# Patient Record
Sex: Female | Born: 1980 | Race: Black or African American | Hispanic: No | Marital: Single | Smoking: Current every day smoker
Health system: Southern US, Community
[De-identification: ages and names within clinical notes are randomized; demographics above are authoritative.]

## PROBLEM LIST (undated history)

## (undated) DIAGNOSIS — S0500XA Injury of conjunctiva and corneal abrasion without foreign body, unspecified eye, initial encounter: Secondary | ICD-10-CM

## (undated) DIAGNOSIS — G43909 Migraine, unspecified, not intractable, without status migrainosus: Secondary | ICD-10-CM

## (undated) DIAGNOSIS — F32A Depression, unspecified: Secondary | ICD-10-CM

## (undated) DIAGNOSIS — K219 Gastro-esophageal reflux disease without esophagitis: Secondary | ICD-10-CM

## (undated) DIAGNOSIS — M1990 Unspecified osteoarthritis, unspecified site: Secondary | ICD-10-CM

## (undated) DIAGNOSIS — F419 Anxiety disorder, unspecified: Secondary | ICD-10-CM

## (undated) DIAGNOSIS — T7840XA Allergy, unspecified, initial encounter: Secondary | ICD-10-CM

## (undated) DIAGNOSIS — J45909 Unspecified asthma, uncomplicated: Secondary | ICD-10-CM

## (undated) HISTORY — PX: CERVICAL POLYPECTOMY: SHX88

## (undated) HISTORY — PX: DILATION AND CURETTAGE OF UTERUS: SHX78

## (undated) HISTORY — DX: Anxiety disorder, unspecified: F41.9

## (undated) HISTORY — DX: Allergy, unspecified, initial encounter: T78.40XA

## (undated) HISTORY — DX: Unspecified osteoarthritis, unspecified site: M19.90

## (undated) HISTORY — DX: Gastro-esophageal reflux disease without esophagitis: K21.9

## (undated) HISTORY — DX: Depression, unspecified: F32.A

## (undated) HISTORY — DX: Migraine, unspecified, not intractable, without status migrainosus: G43.909

## (undated) HISTORY — DX: Injury of conjunctiva and corneal abrasion without foreign body, unspecified eye, initial encounter: S05.00XA

---

## 2012-01-09 ENCOUNTER — Emergency Department (INDEPENDENT_AMBULATORY_CARE_PROVIDER_SITE_OTHER)
Admission: EM | Admit: 2012-01-09 | Discharge: 2012-01-09 | Disposition: A | Discharge status: Home / Self Care | Payer: Self-pay | Source: Home / Self Care | Attending: Family Medicine | Admitting: Family Medicine

## 2012-01-09 ENCOUNTER — Encounter (HOSPITAL_COMMUNITY): Payer: Self-pay | Admitting: Emergency Medicine

## 2012-01-09 DIAGNOSIS — B86 Scabies: Secondary | ICD-10-CM

## 2012-01-09 DIAGNOSIS — L301 Dyshidrosis [pompholyx]: Secondary | ICD-10-CM

## 2012-01-09 HISTORY — DX: Unspecified asthma, uncomplicated: J45.909

## 2012-01-09 MED ORDER — HYDROXYZINE HCL 25 MG PO TABS: 25.0000 mg | ORAL_TABLET | Freq: Four times a day (QID) | ORAL | Status: AC

## 2012-01-09 MED ORDER — PERMETHRIN 5 % EX CREA: TOPICAL_CREAM | CUTANEOUS | Status: AC

## 2012-01-09 MED ORDER — TRIAMCINOLONE ACETONIDE 0.5 % EX OINT: TOPICAL_OINTMENT | CUTANEOUS | Status: DC

## 2012-01-09 NOTE — ED Provider Notes (Addendum)
History     CSN: [id number]  Arrival date & time 01/09/12  1546   First MD Initiated Contact with Patient 01/09/12 1600      Chief Complaint  Patient presents with  . Rash  . Allergies    (Consider location/radiation/quality/duration/timing/severity/associated sxs/prior treatment) HPI Comments: 31 year old morbidly obese female with history of glucose intolerance and asthma. Here complaining of 3 days with an itchy rash in her hands that she describes as itchy blisters. She denies having this rash in the past. She's also complaining of itchiness around her armpits  her low abdomen. 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 Her symptoms are worse at nighttime. She is not using any medications for her symptoms currently.   Past Medical History  Diagnosis Date  . Diabetes mellitus   . Asthma     History reviewed. No pertinent past surgical history.  No family history on file.  History  Substance Use Topics  . Smoking status: Current Everyday Smoker  . Smokeless tobacco: Not on file  . Alcohol Use: No    OB History    Grav Para Term Preterm Abortions TAB SAB Ect Mult Living                  Review of Systems  Constitutional: Negative for fever and chills.  HENT: Negative for sore throat and trouble swallowing.   Eyes: Negative for redness and itching.  Gastrointestinal: Negative for nausea, vomiting and abdominal pain.  Musculoskeletal: Negative for myalgias and arthralgias.  Skin: Positive for rash.  Neurological: Negative for headaches.    Allergies  Sulfa antibiotics  Home Medications   Current Outpatient Rx  Name Route Sig Dispense Refill  . HYDROXYZINE HCL 25 MG PO TABS Oral Take 1 tablet (25 mg total) by mouth every 6 (six) hours. 20 tablet 0  . PERMETHRIN 5 % EX CREA  Apply to affected area from neck to toe once as instructed. Can repeat in 14 days. 60 g 1  . TRIAMCINOLONE ACETONIDE 0.5 % EX OINT   Apply in affected skin twice a day 30 g 0    BP 117/81  Pulse 77  Temp 97.8 F (36.6 C) (Oral)  Resp 18  SpO2 99%  LMP 12/31/2011  Physical Exam  Nursing note and vitals reviewed. Constitutional: She is oriented to person, place, and time. She appears well-developed and well-nourished. No distress.       Morbidly obese  HENT:  Head: Normocephalic and atraumatic.  Mouth/Throat: Oropharynx is clear and moist. No oropharyngeal exudate.  Eyes: Conjunctivae are normal. Pupils are equal, round, and reactive to light. Right eye exhibits no discharge. Left eye exhibits no discharge. No scleral icterus.  Neck: Neck supple.  Cardiovascular: Normal heart sounds.   Pulmonary/Chest: Breath sounds normal.  Lymphadenopathy:    She has no cervical adenopathy.  Neurological: She is alert and oriented to person, place, and time.  Skin:       Hands: papular erythema scattered in dorsum of hands. There is also deep vesicular erythema of the palms. Rash is pruriginous. There are some excoriated papules in inner wrists.  There are also excoriated papular lesions in forearms and more confluent close to arm pits bilaterally and on low abdomen. No vesicles or pustules. No skin swelling or erythema.     ED Course  Procedures (including critical care time)  Labs Reviewed - No data to display No results found.  1. Dyshidrotic hand dermatitis   2. 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 Rash in her torso and proximal arms resembles scabies. Treated with triamcinolone ointment, hydroxyzine and permethrin. Asked to return or followup with her PCP or dermatology as needed.   Sharin Grave, MD 01/10/12 1610  Sharin Grave, MD 01/10/12 [id number]

## 2012-01-09 NOTE — ED Notes (Signed)
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

## 2012-07-24 ENCOUNTER — Emergency Department (HOSPITAL_COMMUNITY)
Admission: EM | Admit: 2012-07-24 | Discharge: 2012-07-24 | Disposition: A | Discharge status: Home / Self Care | Payer: PRIVATE HEALTH INSURANCE | Attending: Emergency Medicine | Admitting: Emergency Medicine

## 2012-07-24 ENCOUNTER — Encounter (HOSPITAL_COMMUNITY): Payer: Self-pay | Admitting: Emergency Medicine

## 2012-07-24 DIAGNOSIS — A088 Other specified intestinal infections: Secondary | ICD-10-CM | POA: Insufficient documentation

## 2012-07-24 DIAGNOSIS — Z3202 Encounter for pregnancy test, result negative: Secondary | ICD-10-CM | POA: Insufficient documentation

## 2012-07-24 DIAGNOSIS — F17200 Nicotine dependence, unspecified, uncomplicated: Secondary | ICD-10-CM | POA: Insufficient documentation

## 2012-07-24 DIAGNOSIS — R1013 Epigastric pain: Secondary | ICD-10-CM | POA: Insufficient documentation

## 2012-07-24 DIAGNOSIS — A599 Trichomoniasis, unspecified: Secondary | ICD-10-CM | POA: Insufficient documentation

## 2012-07-24 DIAGNOSIS — E119 Type 2 diabetes mellitus without complications: Secondary | ICD-10-CM | POA: Insufficient documentation

## 2012-07-24 DIAGNOSIS — A084 Viral intestinal infection, unspecified: Secondary | ICD-10-CM

## 2012-07-24 DIAGNOSIS — J45909 Unspecified asthma, uncomplicated: Secondary | ICD-10-CM | POA: Insufficient documentation

## 2012-07-24 DIAGNOSIS — Z79899 Other long term (current) drug therapy: Secondary | ICD-10-CM | POA: Insufficient documentation

## 2012-07-24 LAB — CBC WITH DIFFERENTIAL/PLATELET
Basophils Absolute: 0 10*3/uL (ref 0.0–0.1)
HCT: 37.1 % (ref 36.0–46.0)
Lymphocytes Relative: 15 % (ref 12–46)
Monocytes Absolute: 0.4 10*3/uL (ref 0.1–1.0)
Neutro Abs: 6.5 10*3/uL (ref 1.7–7.7)
Platelets: 249 10*3/uL (ref 150–400)
RBC: 4.3 MIL/uL (ref 3.87–5.11)
RDW: 14.5 % (ref 11.5–15.5)
WBC: 8.3 10*3/uL (ref 4.0–10.5)

## 2012-07-24 LAB — URINE MICROSCOPIC-ADD ON

## 2012-07-24 LAB — POCT PREGNANCY, URINE: Preg Test, Ur: NEGATIVE

## 2012-07-24 LAB — URINALYSIS, ROUTINE W REFLEX MICROSCOPIC
Glucose, UA: NEGATIVE mg/dL
Protein, ur: NEGATIVE mg/dL
Specific Gravity, Urine: 1.023 (ref 1.005–1.030)

## 2012-07-24 LAB — BASIC METABOLIC PANEL
CO2: 23 mEq/L (ref 19–32)
Chloride: 103 mEq/L (ref 96–112)
Sodium: 136 mEq/L (ref 135–145)

## 2012-07-24 LAB — LIPASE, BLOOD: Lipase: 25 U/L (ref 11–59)

## 2012-07-24 MED ORDER — ONDANSETRON HCL 4 MG/2ML IJ SOLN
4.0000 mg | Freq: Once | INTRAMUSCULAR | Status: AC
  Administered 2012-07-24: 4 mg via INTRAVENOUS

## 2012-07-24 MED ORDER — ONDANSETRON HCL 4 MG PO TABS: 4.0000 mg | ORAL_TABLET | Freq: Three times a day (TID) | ORAL | Status: DC | PRN

## 2012-07-24 MED ORDER — ONDANSETRON HCL 4 MG/2ML IJ SOLN
4.0000 mg | Freq: Once | INTRAMUSCULAR | Status: AC
  Administered 2012-07-24: 4 mg via INTRAVENOUS
  Filled 2012-07-24: qty 2

## 2012-07-24 MED ORDER — SODIUM CHLORIDE 0.9 % IV BOLUS (SEPSIS)
1000.0000 mL | Freq: Once | INTRAVENOUS | Status: AC
  Administered 2012-07-24: 1000 mL via INTRAVENOUS

## 2012-07-24 MED ORDER — HYDROCODONE-ACETAMINOPHEN 5-325 MG PO TABS: 2.0000 | ORAL_TABLET | ORAL | Status: DC | PRN

## 2012-07-24 MED ORDER — METRONIDAZOLE 500 MG PO TABS: 500.0000 mg | ORAL_TABLET | Freq: Two times a day (BID) | ORAL | Status: DC

## 2012-07-24 MED ORDER — MORPHINE SULFATE 4 MG/ML IJ SOLN
4.0000 mg | Freq: Once | INTRAMUSCULAR | Status: AC
  Administered 2012-07-24: 4 mg via INTRAVENOUS
  Filled 2012-07-24: qty 1

## 2012-07-24 MED ORDER — ONDANSETRON HCL 4 MG/2ML IJ SOLN
INTRAMUSCULAR | Status: AC
  Administered 2012-07-24: 4 mg via INTRAVENOUS
  Filled 2012-07-24: qty 2

## 2012-07-24 NOTE — ED Provider Notes (Signed)
This is a 32 year old female, who presents emergency department with chief complaint of nausea and vomiting. Patient was initially seen by [facility], PA-C, who tells me that the plan will be to discharge the patient after routine labs have been obtained.  Results for orders placed during the hospital encounter of 07/24/12  URINALYSIS, ROUTINE W REFLEX MICROSCOPIC      Component Value Range   Color, Urine YELLOW  YELLOW   APPearance HAZY (*) CLEAR   Specific Gravity, Urine 1.023  1.005 - 1.030   pH 7.0  5.0 - 8.0   Glucose, UA NEGATIVE  NEGATIVE mg/dL   Hgb urine dipstick LARGE (*) NEGATIVE   Bilirubin Urine NEGATIVE  NEGATIVE   Ketones, ur 40 (*) NEGATIVE mg/dL   Protein, ur NEGATIVE  NEGATIVE mg/dL   Urobilinogen, UA 1.0  0.0 - 1.0 mg/dL   Nitrite NEGATIVE  NEGATIVE   Leukocytes, UA SMALL (*) NEGATIVE  CBC WITH DIFFERENTIAL      Component Value Range   WBC 8.3  4.0 - 10.5 K/uL   RBC 4.30  3.87 - 5.11 MIL/uL   Hemoglobin 12.3  12.0 - 15.0 g/dL   HCT 57.8  46.9 - 62.9 %   MCV 86.3  78.0 - 100.0 fL   MCH 28.6  26.0 - 34.0 pg   MCHC 33.2  30.0 - 36.0 g/dL   RDW 52.8  41.3 - 24.4 %   Platelets 249  150 - 400 K/uL   Neutrophils Relative 79 (*) 43 - 77 %   Neutro Abs 6.5  1.7 - 7.7 K/uL   Lymphocytes Relative 15  12 - 46 %   Lymphs Abs 1.3  0.7 - 4.0 K/uL   Monocytes Relative 5  3 - 12 %   Monocytes Absolute 0.4  0.1 - 1.0 K/uL   Eosinophils Relative 1  0 - 5 %   Eosinophils Absolute 0.1  0.0 - 0.7 K/uL   Basophils Relative 0  0 - 1 %   Basophils Absolute 0.0  0.0 - 0.1 K/uL  BASIC METABOLIC PANEL      Component Value Range   Sodium 136  135 - 145 mEq/L   Potassium 4.1  3.5 - 5.1 mEq/L   Chloride 103  96 - 112 mEq/L   CO2 23  19 - 32 mEq/L   Glucose, Bld 98  70 - 99 mg/dL   BUN 8  6 - 23 mg/dL   Creatinine, Ser 0.10 (*) 0.50 - 1.10 mg/dL   Calcium 8.4  8.4 - 27.2 mg/dL   GFR calc non Af Amer >90  >90 mL/min   GFR calc Af Amer >90  >90 mL/min  LIPASE, BLOOD      Component  Value Range   Lipase 25  11 - 59 U/L  POCT PREGNANCY, URINE      Component Value Range   Preg Test, Ur NEGATIVE  NEGATIVE  URINE MICROSCOPIC-ADD ON      Component Value Range   Squamous Epithelial / LPF MANY (*) RARE   WBC, UA 3-6  <3 WBC/hpf   RBC / HPF 0-2  <3 RBC/hpf   Bacteria, UA FEW (*) RARE   Urine-Other TRICHOMONAS PRESENT     Patient feeling better after receiving Zofran and pain medicine. I'm going to discharge the patient to home with a few pain pills, Zofran, and metronidazole to treat Trichomonas in her urine. Encouraged the patient to notify her sexual partners, so they can also be treated if  necessary. Patient understands and agrees with the plan. She is stable and a for discharge. Patient is tolerating by mouth fluids.    Roxy Horseman, PA-C 07/24/12 [phone]

## 2012-07-24 NOTE — ED Notes (Signed)
Pt tolerated fluids well, NAD. Pt requesting sandwich.

## 2012-07-24 NOTE — ED Provider Notes (Signed)
Please see the initial physician note.  I was available for consultation the completion of the patient's care.  Gerhard Munch, MD 07/24/12 2358

## 2012-07-24 NOTE — ED Notes (Signed)
Vomiting since 4 am feels weak

## 2012-07-24 NOTE — ED Notes (Signed)
Pt states she feels better. Provider informed

## 2012-07-24 NOTE — ED Provider Notes (Signed)
History     CSN: [id number]  Arrival date & time 07/24/12  1353   First MD Initiated Contact with Patient 07/24/12 1416      Chief Complaint  Patient presents with  . Emesis    (Consider location/radiation/quality/duration/timing/severity/associated sxs/prior treatment) HPI Comments: Patient is a 32 year old female with a past medical history of diabetes who presents with epigastric abdominal pain that started this morning. The pain is located in her epigastrium and does not radiate. The pain is described as cramping and severe. The pain started gradually and progressively worsened since the onset. No alleviating/aggravating factors. The patient has tried nothing for symptoms without relief. Associated symptoms include nausea and vomiting. Patient denies fever, headache, NVD, chest pain, SOB, dysuria, constipation, abnormal vaginal bleeding/discharge.      Past Medical History  Diagnosis Date  . Diabetes mellitus   . Asthma     History reviewed. No pertinent past surgical history.  No family history on file.  History  Substance Use Topics  . Smoking status: Current Every Day Smoker  . Smokeless tobacco: Not on file  . Alcohol Use: No    OB History    Grav Para Term Preterm Abortions TAB SAB Ect Mult Living                  Review of Systems  Gastrointestinal: Positive for nausea, vomiting and abdominal pain.  All other systems reviewed and are negative.    Allergies  Sulfa antibiotics  Home Medications   Current Outpatient Rx  Name  Route  Sig  Dispense  Refill  . ALBUTEROL SULFATE HFA 108 (90 BASE) MCG/ACT IN AERS   Inhalation   Inhale 2 puffs into the lungs every 6 (six) hours as needed. For shortness of breath           BP 160/81  Pulse 94  Temp 98 F (36.7 C) (Oral)  Resp 16  SpO2 97%  Physical Exam  Nursing note and vitals reviewed. Constitutional: She is oriented to person, place, and time. She appears well-developed and well-nourished.  No distress.  HENT:  Head: Normocephalic and atraumatic.  Eyes: Conjunctivae normal and EOM are normal.  Neck: Normal range of motion. Neck supple.  Cardiovascular: Normal rate and regular rhythm.  Exam reveals no gallop and no friction rub.   No murmur heard. Pulmonary/Chest: Effort normal and breath sounds normal. She has no wheezes. She has no rales. She exhibits no tenderness.  Abdominal: Soft. She exhibits no distension. There is tenderness. There is no rebound.       Epigastric tenderness to palpation.   Musculoskeletal: Normal range of motion.  Neurological: She is alert and oriented to person, place, and time. Coordination normal.       Speech is goal-oriented. Moves limbs without ataxia.   Skin: Skin is warm and dry.  Psychiatric: She has a normal mood and affect. Her behavior is normal.    ED Course  Procedures (including critical care time)  Labs Reviewed  URINALYSIS, ROUTINE W REFLEX MICROSCOPIC - Abnormal; Notable for the following:    APPearance HAZY (*)     Hgb urine dipstick LARGE (*)     Ketones, ur 40 (*)     Leukocytes, UA SMALL (*)     All other components within normal limits  CBC WITH DIFFERENTIAL - Abnormal; Notable for the following:    Neutrophils Relative 79 (*)     All other components within normal limits  BASIC METABOLIC PANEL -  Abnormal; Notable for the following:    Creatinine, Ser 0.47 (*)     All other components within normal limits  URINE MICROSCOPIC-ADD ON - Abnormal; Notable for the following:    Squamous Epithelial / LPF MANY (*)     Bacteria, UA FEW (*)     All other components within normal limits  LIPASE, BLOOD  POCT PREGNANCY, URINE  URINE CULTURE   No results found.   1. Viral gastroenteritis   2. Trichimoniasis       MDM  3:04 PM Patient will have fluids, zofran, and morphine.    3:52 PM Patient signed out to Roxy Horseman, PA-C.      Emilia Beck, PA-C 07/25/12 [phone]

## 2012-07-25 LAB — URINE CULTURE

## 2012-07-27 NOTE — ED Provider Notes (Signed)
Medical screening examination/treatment/procedure(s) were performed by non-physician practitioner and as supervising physician I was immediately available for consultation/collaboration.   Maher Shon J. Eber Ferrufino, MD 07/27/12 1541 

## 2016-07-28 ENCOUNTER — Inpatient Hospital Stay (HOSPITAL_COMMUNITY)
Admission: AD | Admit: 2016-07-28 | Discharge: 2016-07-28 | Disposition: A | Discharge status: Home / Self Care | Payer: PRIVATE HEALTH INSURANCE | Source: Ambulatory Visit | Attending: Obstetrics and Gynecology | Admitting: Obstetrics and Gynecology

## 2016-07-28 ENCOUNTER — Encounter (HOSPITAL_COMMUNITY): Payer: Self-pay | Admitting: *Deleted

## 2016-07-28 DIAGNOSIS — F17200 Nicotine dependence, unspecified, uncomplicated: Secondary | ICD-10-CM | POA: Diagnosis not present

## 2016-07-28 DIAGNOSIS — J45909 Unspecified asthma, uncomplicated: Secondary | ICD-10-CM | POA: Insufficient documentation

## 2016-07-28 DIAGNOSIS — Z882 Allergy status to sulfonamides status: Secondary | ICD-10-CM | POA: Insufficient documentation

## 2016-07-28 DIAGNOSIS — R1030 Lower abdominal pain, unspecified: Secondary | ICD-10-CM | POA: Diagnosis not present

## 2016-07-28 DIAGNOSIS — M549 Dorsalgia, unspecified: Secondary | ICD-10-CM | POA: Diagnosis not present

## 2016-07-28 DIAGNOSIS — Z3202 Encounter for pregnancy test, result negative: Secondary | ICD-10-CM | POA: Diagnosis not present

## 2016-07-28 DIAGNOSIS — Z6841 Body Mass Index (BMI) 40.0 and over, adult: Secondary | ICD-10-CM | POA: Diagnosis not present

## 2016-07-28 DIAGNOSIS — E119 Type 2 diabetes mellitus without complications: Secondary | ICD-10-CM | POA: Insufficient documentation

## 2016-07-28 DIAGNOSIS — R102 Pelvic and perineal pain: Secondary | ICD-10-CM | POA: Diagnosis not present

## 2016-07-28 DIAGNOSIS — N930 Postcoital and contact bleeding: Secondary | ICD-10-CM | POA: Insufficient documentation

## 2016-07-28 DIAGNOSIS — Z113 Encounter for screening for infections with a predominantly sexual mode of transmission: Secondary | ICD-10-CM

## 2016-07-28 HISTORY — DX: Morbid (severe) obesity due to excess calories: E66.01

## 2016-07-28 LAB — CBC
HEMATOCRIT: 35.5 % — AB (ref 36.0–46.0)
Hemoglobin: 11.9 g/dL — ABNORMAL LOW (ref 12.0–15.0)
MCH: 29 pg (ref 26.0–34.0)
MCHC: 33.5 g/dL (ref 30.0–36.0)
MCV: 86.4 fL (ref 78.0–100.0)
Platelets: 279 10*3/uL (ref 150–400)
RBC: 4.11 MIL/uL (ref 3.87–5.11)
RDW: 14.2 % (ref 11.5–15.5)
WBC: 8.4 10*3/uL (ref 4.0–10.5)

## 2016-07-28 LAB — URINALYSIS, MICROSCOPIC (REFLEX)

## 2016-07-28 LAB — URINALYSIS, ROUTINE W REFLEX MICROSCOPIC
Bilirubin Urine: NEGATIVE
GLUCOSE, UA: NEGATIVE mg/dL
Ketones, ur: NEGATIVE mg/dL
LEUKOCYTES UA: NEGATIVE
Nitrite: NEGATIVE
PH: 5.5 (ref 5.0–8.0)
Protein, ur: NEGATIVE mg/dL
SPECIFIC GRAVITY, URINE: 1.025 (ref 1.005–1.030)

## 2016-07-28 LAB — WET PREP, GENITAL
CLUE CELLS WET PREP: NONE SEEN
SPERM: NONE SEEN
Trich, Wet Prep: NONE SEEN
Yeast Wet Prep HPF POC: NONE SEEN

## 2016-07-28 LAB — HIV ANTIBODY (ROUTINE TESTING W REFLEX): HIV Screen 4th Generation wRfx: NONREACTIVE

## 2016-07-28 LAB — POCT PREGNANCY, URINE: Preg Test, Ur: NEGATIVE

## 2016-07-28 LAB — RPR: RPR Ser Ql: NONREACTIVE

## 2016-07-28 MED ORDER — KETOROLAC TROMETHAMINE 60 MG/2ML IM SOLN
60.0000 mg | Freq: Once | INTRAMUSCULAR | Status: AC
  Administered 2016-07-28: 60 mg via INTRAMUSCULAR
  Filled 2016-07-28: qty 2

## 2016-07-28 NOTE — Progress Notes (Signed)
Pt d/c home by Judeth HornErin Lawrence NP

## 2016-07-28 NOTE — MAU Note (Signed)
Had sex Thurs and felt symptoms of uti afterward. Had pain R flank around to lower abd. Friday had some vag bleeding and not time for period. Today have passed few clots on occ. With irreg bleeding.

## 2016-07-28 NOTE — Discharge Instructions (Signed)

## 2016-07-28 NOTE — MAU Provider Note (Signed)
History     CSN: [id number]  Arrival date and time: 07/28/16 [id number]   First Provider Initiated Contact with Patient 07/28/16 0158      Chief Complaint  Patient presents with  . Back Pain  . Vaginal Bleeding  . Possible Pregnancy   HPI  [name] is a 36 y.o. G0P0000 female who presents for abdominal pain & vaginal bleeding. Reports lower abdominal pain that radiates to her back since Thursday after intercourse. Pain is constant & throbbing in nature. Rates pain 10/10. Has not treated. Nothing makes better or worse. States she felt like she had a UTI immediately after intercourse. States she had symptoms of dysuria & increased frequency that have resolved since then.  Had some vaginal spotting immediately after intercourse on Thursday then bleeding like the beginning of menses on Friday. States the bleeding stopped by the end of the day but she passed 2 clots yesterday. No further bleeding. States her period isn't due for another week. Has history of irregular cycles up until 2 years ago; since then she's had regular cycles the first week of every month. Is not currently using contraception.     Past Medical History:  Diagnosis Date  . Asthma   . Diabetes mellitus   . Morbid obesity (HCC)     History reviewed. No pertinent surgical history.  History reviewed. No pertinent family history.  Social History  Substance Use Topics  . Smoking status: Current Every Day Smoker  . Smokeless tobacco: Never Used  . Alcohol use No    Allergies:  Allergies  Allergen Reactions  . Sulfa Antibiotics Other (See Comments)    Reaction unknown    Prescriptions Prior to Admission  Medication Sig Dispense Refill Last Dose  . albuterol (PROVENTIL HFA;VENTOLIN HFA) 108 (90 BASE) MCG/ACT inhaler Inhale 2 puffs into the lungs every 6 (six) hours as needed. For shortness of breath   Past Week at Unknown time  . HYDROcodone-acetaminophen (NORCO/VICODIN) 5-325 MG per tablet Take 2 tablets by  mouth every 4 (four) hours as needed for pain. 6 tablet 0   . metroNIDAZOLE (FLAGYL) 500 MG tablet Take 1 tablet (500 mg total) by mouth 2 (two) times daily. 14 tablet 0   . ondansetron (ZOFRAN) 4 MG tablet Take 1 tablet (4 mg total) by mouth every 8 (eight) hours as needed for nausea. 20 tablet 0     Review of Systems  Constitutional: Negative.   Gastrointestinal: Positive for abdominal pain. Negative for constipation, diarrhea, nausea and vomiting.  Genitourinary: Positive for dysuria, frequency and vaginal bleeding. Negative for vaginal discharge.  Musculoskeletal: Positive for back pain.   Physical Exam   Temperature 97.9 F (36.6 C), resp. rate 18, height 5\' 8"  (1.727 m), weight (!) 373 lb 1.6 oz (169.2 kg), last menstrual period 07/03/2016.  Physical Exam  Nursing note and vitals reviewed. Constitutional: She is oriented to person, place, and time. She appears well-developed and well-nourished. No distress.  HENT:  Head: Normocephalic and atraumatic.  Eyes: Conjunctivae are normal. Right eye exhibits no discharge. Left eye exhibits no discharge. No scleral icterus.  Neck: Normal range of motion.  Respiratory: Effort normal. No respiratory distress.  GI: Soft. She exhibits no distension. There is no tenderness. There is no rebound, no guarding and no CVA tenderness.  Genitourinary: Vagina normal. Cervix exhibits no motion tenderness and no friability. No bleeding in the vagina.  Genitourinary Comments: Small amount of clear mucoid discharge. No bleeding. Difficult to palpate uterus or adnexa d/t  body habitus.   Neurological: She is alert and oriented to person, place, and time.  Skin: Skin is warm and dry. She is not diaphoretic.  Psychiatric: She has a normal mood and affect. Her behavior is normal. Judgment and thought content normal.    MAU Course  Procedures Results for orders placed or performed during the hospital encounter of 07/28/16 (from the past 24 hour(s))   Urinalysis, Routine w reflex microscopic (not at [facility])     Status: Abnormal   Collection Time: 07/28/16  1:10 AM  Result Value Ref Range   Color, Urine YELLOW YELLOW   APPearance CLEAR CLEAR   Specific Gravity, Urine 1.025 1.005 - 1.030   pH 5.5 5.0 - 8.0   Glucose, UA NEGATIVE NEGATIVE mg/dL   Hgb urine dipstick MODERATE (A) NEGATIVE   Bilirubin Urine NEGATIVE NEGATIVE   Ketones, ur NEGATIVE NEGATIVE mg/dL   Protein, ur NEGATIVE NEGATIVE mg/dL   Nitrite NEGATIVE NEGATIVE   Leukocytes, UA NEGATIVE NEGATIVE  Urinalysis, Microscopic (reflex)     Status: Abnormal   Collection Time: 07/28/16  1:10 AM  Result Value Ref Range   RBC / HPF 0-5 0 - 5 RBC/hpf   WBC, UA 0-5 0 - 5 WBC/hpf   Bacteria, UA RARE (A) NONE SEEN   Squamous Epithelial / LPF 0-5 (A) NONE SEEN   Mucous PRESENT   Pregnancy, urine POC     Status: None   Collection Time: 07/28/16  1:49 AM  Result Value Ref Range   Preg Test, Ur NEGATIVE NEGATIVE  CBC     Status: Abnormal   Collection Time: 07/28/16  2:29 AM  Result Value Ref Range   WBC 8.4 4.0 - 10.5 K/uL   RBC 4.11 3.87 - 5.11 MIL/uL   Hemoglobin 11.9 (L) 12.0 - 15.0 g/dL   HCT 16.1 (L) 09.6 - 04.5 %   MCV 86.4 78.0 - 100.0 fL   MCH 29.0 26.0 - 34.0 pg   MCHC 33.5 30.0 - 36.0 g/dL   RDW 40.9 81.1 - 91.4 %   Platelets 279 150 - 400 K/uL  Wet prep, genital     Status: Abnormal   Collection Time: 07/28/16  3:31 AM  Result Value Ref Range   Yeast Wet Prep HPF POC NONE SEEN NONE SEEN   Trich, Wet Prep NONE SEEN NONE SEEN   Clue Cells Wet Prep HPF POC NONE SEEN NONE SEEN   WBC, Wet Prep HPF POC FEW (A) NONE SEEN   Sperm NONE SEEN     MDM UPT negative U/a not impressive for UTI -- urine culture sent d/t symptoms Toradol 60 mg IM -- pt reports resolution of pain STI testing per patient request  Assessment and Plan  A: 1. Lower abdominal pain   2. Screen for STD (sexually transmitted disease)   3. Postcoital bleeding    P: Discharge home Continue OTC  meds PRN pain Urine culture, & GC/CT pending Contact PCP for routine care   Judeth Horn 07/28/2016, 1:58 AM

## 2016-07-30 ENCOUNTER — Telehealth: Payer: Self-pay | Admitting: Student

## 2016-07-30 LAB — URINE CULTURE

## 2016-07-30 LAB — GC/CHLAMYDIA PROBE AMP (~~LOC~~) NOT AT ARMC
Chlamydia: NEGATIVE
NEISSERIA GONORRHEA: NEGATIVE

## 2016-07-30 NOTE — Telephone Encounter (Signed)
Attempted to contact pt regarding urine cx results. No answer.   If pt asymptomatic, can tx with macrobid with f/u urine culture.  If pt symptomatic, needs to be brought in for IV vancomycin.  Recommendation per infectious disease & discussed with Dr. Vergie LivingPickens.

## 2016-07-31 ENCOUNTER — Other Ambulatory Visit: Payer: Self-pay | Admitting: Obstetrics and Gynecology

## 2016-08-01 ENCOUNTER — Telehealth: Payer: Self-pay | Admitting: Certified Nurse Midwife

## 2016-08-01 NOTE — Telephone Encounter (Signed)
Left message to call back regarding labs

## 2016-08-02 ENCOUNTER — Telehealth: Payer: Self-pay | Admitting: Medical

## 2016-08-02 ENCOUNTER — Other Ambulatory Visit: Payer: Self-pay | Admitting: Medical

## 2016-08-02 ENCOUNTER — Telehealth (HOSPITAL_COMMUNITY): Payer: Self-pay

## 2016-08-02 DIAGNOSIS — N3000 Acute cystitis without hematuria: Secondary | ICD-10-CM

## 2016-08-02 MED ORDER — NITROFURANTOIN MONOHYD MACRO 100 MG PO CAPS: 100.0000 mg | ORAL_CAPSULE | Freq: Two times a day (BID) | ORAL | 0 refills | Status: DC

## 2016-08-02 NOTE — Telephone Encounter (Signed)
Patient returned call to MAU. Advised of UTI and need for treatment. Rx for Macrobid sent to patient's pharmacy. Confirmed pharmacy with patient.   Marny LowensteinJulie N Wenzel, PA-C 08/02/2016 7:49 AM

## 2016-09-03 NOTE — Telephone Encounter (Signed)
See "Contacts" 

## 2017-09-15 ENCOUNTER — Encounter (HOSPITAL_COMMUNITY): Payer: Self-pay

## 2017-09-15 ENCOUNTER — Emergency Department (HOSPITAL_COMMUNITY)
Admission: EM | Admit: 2017-09-15 | Discharge: 2017-09-15 | Disposition: A | Discharge status: Home / Self Care | Payer: PRIVATE HEALTH INSURANCE | Attending: Emergency Medicine | Admitting: Emergency Medicine

## 2017-09-15 ENCOUNTER — Other Ambulatory Visit: Payer: Self-pay

## 2017-09-15 DIAGNOSIS — Z5321 Procedure and treatment not carried out due to patient leaving prior to being seen by health care provider: Secondary | ICD-10-CM | POA: Insufficient documentation

## 2017-09-15 DIAGNOSIS — H5710 Ocular pain, unspecified eye: Secondary | ICD-10-CM | POA: Insufficient documentation

## 2017-09-15 MED ORDER — TETRACAINE HCL 0.5 % OP SOLN: 2.0000 [drp] | Freq: Once | OPHTHALMIC | Status: DC

## 2017-09-15 MED ORDER — FLUORESCEIN SODIUM 1 MG OP STRP: 1.0000 | ORAL_STRIP | Freq: Once | OPHTHALMIC | Status: DC

## 2017-09-15 NOTE — ED Notes (Signed)
Patient does not want to wait any more, attempted to convince patient to stay.    Patient ambulatory to nurse 1st, airway intact no distress noted

## 2017-09-15 NOTE — ED Triage Notes (Addendum)
Pt reports L eye pain/irritation. [place], pain started yesterday. Eye is reddeded and constantly tearing. Pt reports blurry vision, but denies loss of vision. 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

## 2017-09-21 ENCOUNTER — Encounter (HOSPITAL_COMMUNITY): Payer: Self-pay | Admitting: Emergency Medicine

## 2017-09-21 ENCOUNTER — Emergency Department (HOSPITAL_COMMUNITY)
Admission: EM | Admit: 2017-09-21 | Discharge: 2017-09-21 | Disposition: A | Discharge status: Home / Self Care | Payer: PRIVATE HEALTH INSURANCE | Attending: Emergency Medicine | Admitting: Emergency Medicine

## 2017-09-21 DIAGNOSIS — S0502XA Injury of conjunctiva and corneal abrasion without foreign body, left eye, initial encounter: Secondary | ICD-10-CM

## 2017-09-21 DIAGNOSIS — X58XXXA Exposure to other specified factors, initial encounter: Secondary | ICD-10-CM | POA: Insufficient documentation

## 2017-09-21 DIAGNOSIS — Y999 Unspecified external cause status: Secondary | ICD-10-CM | POA: Insufficient documentation

## 2017-09-21 DIAGNOSIS — E119 Type 2 diabetes mellitus without complications: Secondary | ICD-10-CM | POA: Insufficient documentation

## 2017-09-21 DIAGNOSIS — Y939 Activity, unspecified: Secondary | ICD-10-CM | POA: Insufficient documentation

## 2017-09-21 DIAGNOSIS — J45909 Unspecified asthma, uncomplicated: Secondary | ICD-10-CM | POA: Insufficient documentation

## 2017-09-21 DIAGNOSIS — Y929 Unspecified place or not applicable: Secondary | ICD-10-CM | POA: Insufficient documentation

## 2017-09-21 DIAGNOSIS — Z79899 Other long term (current) drug therapy: Secondary | ICD-10-CM | POA: Insufficient documentation

## 2017-09-21 DIAGNOSIS — F17200 Nicotine dependence, unspecified, uncomplicated: Secondary | ICD-10-CM | POA: Insufficient documentation

## 2017-09-21 MED ORDER — KETOROLAC TROMETHAMINE 0.5 % OP SOLN: 1.0000 [drp] | Freq: Four times a day (QID) | OPHTHALMIC | 0 refills | Status: DC

## 2017-09-21 MED ORDER — TETRACAINE HCL 0.5 % OP SOLN
1.0000 [drp] | Freq: Once | OPHTHALMIC | Status: AC
  Administered 2017-09-21: 1 [drp] via OPHTHALMIC
  Filled 2017-09-21: qty 4

## 2017-09-21 MED ORDER — POLYMYXIN B-TRIMETHOPRIM 10000-0.1 UNIT/ML-% OP SOLN: 1.0000 [drp] | OPHTHALMIC | 0 refills | Status: DC

## 2017-09-21 MED ORDER — FLUORESCEIN SODIUM 1 MG OP STRP
1.0000 | ORAL_STRIP | Freq: Once | OPHTHALMIC | Status: AC
  Administered 2017-09-21: 1 via OPHTHALMIC
  Filled 2017-09-21: qty 1

## 2017-09-21 NOTE — ED Triage Notes (Signed)
Pt reports that she has something in her eye.  Pt is holding eye open rocking back and forth.

## 2017-09-21 NOTE — ED Provider Notes (Signed)
[facility] EM[facility]ENC[facility] Provider Note   CSN: [id number] Arrival date & time: 09/21/17  0229     History   Chief Complaint Chief Complaint  Patient presents with  . Eye Pain    HPI Monica Hunter is a 37 y.o. female.  Patient presents to the emergency department with a chief complaint of left eye pain.  She states that she has had the pain for about a week.  She denies any known injury, but states that the eye is very painful.  She has been rubbing it constantly and using artificial tears.  She states that she is very sensitive to the light.  She denies any other symptoms such as flashers or floaters.  The history is provided by the patient. No language interpreter was used.    Past Medical History:  Diagnosis Date  . Asthma   . Diabetes mellitus   . Morbid obesity (HCC)     There are no active problems to display for this patient.   History reviewed. No pertinent surgical history.   OB History    Gravida  0   Para  0   Term  0   Preterm  0   AB  0   Living  0     SAB  0   TAB  0   Ectopic  0   Multiple  0   Live Births  0            Home Medications    Prior to Admission medications   Medication Sig Start Date End Date Taking? Authorizing Provider  albuterol (PROVENTIL HFA;VENTOLIN HFA) 108 (90 BASE) MCG/ACT inhaler Inhale 2 puffs into the lungs every 6 (six) hours as needed. For shortness of breath    [provider]  nitrofurantoin, macrocrystal-monohydrate, (MACROBID) 100 MG capsule Take 1 capsule (100 mg total) by mouth 2 (two) times daily. 08/02/16   Marny LowensteinWenzel, Julie N, PA-C    Family History No family history on file.  Social History Social History   Tobacco Use  . Smoking status: Current Every Day Smoker  . Smokeless tobacco: Never Used  Substance Use Topics  . Alcohol use: No  . Drug use: No     Allergies   Sulfa antibiotics   Review of Systems Review of Systems  All other systems  reviewed and are negative.    Physical Exam Updated Vital Signs BP (!) 163/101 (BP Location: Right Arm)   Pulse 94   Temp 97.8 F (36.6 C) (Oral)   Resp 20   Ht 5\' 8"  (1.727 m)   Wt (!) 184.6 kg (407 lb)   SpO2 100%   BMI 61.88 kg/m   Physical Exam  Constitutional: She is oriented to person, place, and time. No distress.  HENT:  Head: Normocephalic and atraumatic.  Eyes: Pupils are equal, round, and reactive to light. EOM are normal.  Left conjunctival erythema Normal EOMs 3 mm corneal abrasion to the center of the left eye seen under slit lamp No Seidel sign No visible foreign body  Neck: No tracheal deviation present.  Cardiovascular: Normal rate.  Pulmonary/Chest: Effort normal. No respiratory distress.  Abdominal: Soft.  Musculoskeletal: Normal range of motion.  Neurological: She is alert and oriented to person, place, and time.  Skin: Skin is warm and dry. She is not diaphoretic.  Psychiatric: Judgment normal.  Nursing note and vitals reviewed.    ED Treatments / Results  Labs (all labs ordered are listed, but  only abnormal results are displayed) Labs Reviewed - No data to display  EKG None  Radiology No results found.  Procedures Procedures (including critical care time)  Medications Ordered in ED Medications  tetracaine (PONTOCAINE) 0.5 % ophthalmic solution 1 drop (has no administration in time range)  fluorescein ophthalmic strip 1 strip (has no administration in time range)     Initial Impression / Assessment and Plan / ED Course  I have reviewed the triage vital signs and the nursing notes.  Pertinent labs & imaging results that were available during my care of the patient were reviewed by me and considered in my medical decision making (see chart for details).     Patient with large corneal abrasion.  Will treat with Polytrim drops as well as Toradol drops.  Recommend follow-up with ophthalmology.  Patient understands and agrees the  plan.  Final Clinical Impressions(s) / ED Diagnoses   Final diagnoses:  Abrasion of left cornea, initial encounter    ED Discharge Orders        Ordered    ketorolac (ACULAR) 0.5 % ophthalmic solution  Every 6 hours     09/21/17 0316    trimethoprim-polymyxin b (POLYTRIM) ophthalmic solution  Every 4 hours     09/21/17 0316       Roxy Horseman, PA-C 09/21/17 1610    Azalia Bilis, MD 09/21/17 [phone]

## 2018-02-14 DIAGNOSIS — J45909 Unspecified asthma, uncomplicated: Secondary | ICD-10-CM | POA: Insufficient documentation

## 2018-02-14 DIAGNOSIS — L732 Hidradenitis suppurativa: Secondary | ICD-10-CM | POA: Insufficient documentation

## 2019-11-30 DIAGNOSIS — B009 Herpesviral infection, unspecified: Secondary | ICD-10-CM | POA: Insufficient documentation

## 2021-09-13 ENCOUNTER — Encounter (HOSPITAL_COMMUNITY): Payer: Self-pay | Admitting: Emergency Medicine

## 2021-09-13 ENCOUNTER — Other Ambulatory Visit: Payer: Self-pay

## 2021-09-13 ENCOUNTER — Ambulatory Visit (HOSPITAL_COMMUNITY)
Admission: EM | Admit: 2021-09-13 | Discharge: 2021-09-13 | Disposition: A | Discharge status: Home / Self Care | Payer: Commercial Managed Care - HMO | Attending: Family Medicine | Admitting: Family Medicine

## 2021-09-13 DIAGNOSIS — R102 Pelvic and perineal pain: Secondary | ICD-10-CM | POA: Diagnosis not present

## 2021-09-13 DIAGNOSIS — Z113 Encounter for screening for infections with a predominantly sexual mode of transmission: Secondary | ICD-10-CM | POA: Insufficient documentation

## 2021-09-13 DIAGNOSIS — L0231 Cutaneous abscess of buttock: Secondary | ICD-10-CM | POA: Insufficient documentation

## 2021-09-13 DIAGNOSIS — E1165 Type 2 diabetes mellitus with hyperglycemia: Secondary | ICD-10-CM | POA: Insufficient documentation

## 2021-09-13 DIAGNOSIS — E11628 Type 2 diabetes mellitus with other skin complications: Secondary | ICD-10-CM | POA: Diagnosis not present

## 2021-09-13 DIAGNOSIS — N898 Other specified noninflammatory disorders of vagina: Secondary | ICD-10-CM | POA: Diagnosis not present

## 2021-09-13 LAB — POCT URINALYSIS DIPSTICK, ED / UC
Bilirubin Urine: NEGATIVE
Glucose, UA: 100 mg/dL — AB
Ketones, ur: NEGATIVE mg/dL
Nitrite: NEGATIVE
Protein, ur: 100 mg/dL — AB
Specific Gravity, Urine: 1.025 (ref 1.005–1.030)
Urobilinogen, UA: 1 mg/dL (ref 0.0–1.0)
pH: 5.5 (ref 5.0–8.0)

## 2021-09-13 LAB — CBG MONITORING, ED: Glucose-Capillary: 247 mg/dL — ABNORMAL HIGH (ref 70–99)

## 2021-09-13 LAB — POC URINE PREG, ED: Preg Test, Ur: NEGATIVE

## 2021-09-13 MED ORDER — FLUCONAZOLE 150 MG PO TABS: 150.0000 mg | ORAL_TABLET | Freq: Once | ORAL | 0 refills | Status: AC

## 2021-09-13 MED ORDER — DOXYCYCLINE HYCLATE 100 MG PO CAPS: 100.0000 mg | ORAL_CAPSULE | Freq: Two times a day (BID) | ORAL | 0 refills | Status: DC

## 2021-09-13 MED ORDER — LIDOCAINE HCL (PF) 1 % IJ SOLN
INTRAMUSCULAR | Status: AC
  Filled 2021-09-13: qty 30

## 2021-09-13 MED ORDER — METFORMIN HCL ER 750 MG PO TB24
750.0000 mg | ORAL_TABLET | Freq: Every day | ORAL | 1 refills | Status: DC
Start: 2021-09-13 — End: 2021-11-04

## 2021-09-13 NOTE — Discharge Instructions (Addendum)
Your blood sugar is elevated.  Please start metformin.  Make sure to take this with food as it can cause upset stomach.  Make sure you drink plenty fluid and avoid carbohydrates.  We will try to get you in touch with a primary care provider please feel free to call anyone you are interested in establishing care with.  Someone needs to reevaluate you within a few weeks so please return here if you are unable to see primary care. ? ?We drained your abscess.  Start doxycycline twice daily.  We will contact you if we need to arrange additional treatment.  Take Diflucan.  Wear loosefitting cotton underwear and use hypoallergenic soaps and detergents.  Keep area clean.  Follow-up with OB/GYN.  If you have any worsening symptoms including fever, nausea, vomiting, pain, increased welling you need to be seen immediately. ?

## 2021-09-13 NOTE — ED Triage Notes (Signed)
Pt reports an abscess on buttocks x 2 days.  ?Pt also reports vaginal swelling and pain and burning with urination x 4 days. Requesting a pelvic exam and STD testing. Hx of HSV. ?

## 2021-09-13 NOTE — ED Provider Notes (Addendum)
?[facility] ? ? ? ?CSN: [id number] ?Arrival date & time: 09/13/21  [id number] ? ? ?  ? ?History   ?Chief Complaint ?Chief Complaint  ?Patient presents with  ? Abscess  ? ? ?HPI ?Monica Hunter is a 41 y.o. female.  ? ?Patient presents today with multiple concerns.  She reports a 4-day history of vaginal irritation and swelling.  She reports associated dysuria but denies frequency, urgency, pelvic pain.  She denies any significant discharge.  She is wondering if symptoms could be related to increased use of sexual aids or in the time of her birthday.  She does report cleaning as appropriately between uses.  She has a history of HSV but states current symptoms are not similar to previous episodes of this condition.  She is not currently taking antiviral medication on a regular basis.  She is interested in STI testing today. ? ?In addition, patient reports a several day history of abscess on her buttocks.  Reports that she attempted to drain this on her own following her shower when symptoms initially began.  She has been using warm compresses for symptom relief.  Denies any recent antibiotics.  Denies history of recurrent skin infections or history of MRSA.  She is diabetic and not currently managed with any medication; was last on Ozempic and then able to wean off of this medication with diet control.  She is not currently followed by PCP as that she has had difficulty with her health insurance.  She is not monitoring her blood sugar regularly at this time.  She denies any systemic symptoms including fever, nausea, vomiting, shortness of breath, lightheadedness, dizziness. ? ? ?Past Medical History:  ?Diagnosis Date  ? Asthma   ? Diabetes mellitus   ? Morbid obesity ([facility])   ? ? ?There are no problems to display for this patient. ? ? ?History reviewed. No pertinent surgical history. ? ?OB History   ? ? Gravida  ?0  ? Para  ?0  ? Term  ?0  ? Preterm  ?0  ? AB  ?0  ? Living  ?0  ?  ? ? SAB  ?0  ? IAB  ?0  ?  Ectopic  ?0  ? Multiple  ?0  ? Live Births  ?0  ?   ?  ?  ? ? ? ?Home Medications   ? ?Prior to Admission medications   ?Medication Sig Start Date End Date Taking? Authorizing Provider  ?doxycycline (VIBRAMYCIN) 100 MG capsule Take 1 capsule (100 mg total) by mouth 2 (two) times daily. 09/13/21  Yes Brytni Dray K, PA-C  ?fluconazole (DIFLUCAN) 150 MG tablet Take 1 tablet (150 mg total) by mouth once for 1 dose. 09/13/21 09/13/21 Yes Oluwateniola Leitch, Derry Skill, PA-C  ?metFORMIN (GLUCOPHAGE-XR) 750 MG 24 hr tablet Take 1 tablet (750 mg total) by mouth daily with breakfast. 09/13/21  Yes Carys Malina K, PA-C  ?albuterol (PROVENTIL HFA;VENTOLIN HFA) 108 (90 BASE) MCG/ACT inhaler Inhale 2 puffs into the lungs every 6 (six) hours as needed. For shortness of breath    [provider]  ?ketorolac (ACULAR) 0.5 % ophthalmic solution Place 1 drop into the left eye every 6 (six) hours. 09/21/17   Montine Circle, PA-C  ?trimethoprim-polymyxin b (POLYTRIM) ophthalmic solution Place 1 drop into the left eye every 4 (four) hours. 09/21/17   Montine Circle, PA-C  ? ? ?Family History ?Family History  ?Problem Relation Age of Onset  ? Diabetes Mother   ? Diabetes Father   ? ? ?  Social History ?Social History  ? ?Tobacco Use  ? Smoking status: Every Day  ? Smokeless tobacco: Never  ?Substance Use Topics  ? Alcohol use: No  ? Drug use: No  ? ? ? ?Allergies   ?Sulfa antibiotics ? ? ?Review of Systems ?Review of Systems  ?Constitutional:  Positive for activity change. Negative for appetite change, fatigue and fever.  ?Respiratory:  Negative for cough and shortness of breath.   ?Cardiovascular:  Negative for chest pain.  ?Gastrointestinal:  Negative for abdominal pain, diarrhea, nausea and vomiting.  ?Genitourinary:  Positive for dysuria and vaginal pain. Negative for flank pain, hematuria, pelvic pain, urgency, vaginal bleeding and vaginal discharge.  ?Skin:  Positive for wound. Negative for color change.  ? ? ?Physical Exam ?Triage Vital  Signs ?ED Triage Vitals  ?Enc Vitals Group  ?   BP 09/13/21 1018 140/84  ?   Pulse Rate 09/13/21 1018 89  ?   Resp 09/13/21 1018 17  ?   Temp 09/13/21 1018 98.1 ?F (36.7 ?C)  ?   Temp Source 09/13/21 1018 Oral  ?   SpO2 09/13/21 1018 96 %  ?   Weight 09/13/21 1016 (!) 406 lb 15.5 oz (184.6 kg)  ?   Height 09/13/21 1016 5\' 8"  (1.727 m)  ?   Head Circumference --   ?   Peak Flow --   ?   Pain Score 09/13/21 1016 10  ?   Pain Loc --   ?   Pain Edu? --   ?   Excl. in [place]? --   ? ?No data found. ? ?Updated Vital Signs ?BP 140/84 (BP Location: Right Arm)   Pulse 89   Temp 98.1 ?F (36.7 ?C) (Oral)   Resp 17   Ht 5\' 8"  (1.727 m)   Wt (!) 406 lb 15.5 oz (184.6 kg)   SpO2 96%   BMI 61.88 kg/m?  ? ?Visual Acuity ?Right Eye Distance:   ?Left Eye Distance:   ?Bilateral Distance:   ? ?Right Eye Near:   ?Left Eye Near:    ?Bilateral Near:    ? ?Physical Exam ?Vitals reviewed.  ?Constitutional:   ?   General: She is awake. She is not in acute distress. ?   Appearance: Normal appearance. She is well-developed. She is not ill-appearing.  ?   Comments: Very pleasant female appears stated age in no acute distress sitting comfortably in exam room  ?HENT:  ?   Head: Normocephalic and atraumatic.  ?Cardiovascular:  ?   Rate and Rhythm: Normal rate and regular rhythm.  ?   Heart sounds: Normal heart sounds, S1 normal and S2 normal. No murmur heard. ?Pulmonary:  ?   Effort: Pulmonary effort is normal.  ?   Breath sounds: Normal breath sounds. No wheezing, rhonchi or rales.  ?   Comments: Clear to auscultation bilaterally ?Abdominal:  ?   General: Bowel sounds are normal.  ?   Palpations: Abdomen is soft.  ?   Tenderness: There is no abdominal tenderness. There is no right CVA tenderness, left CVA tenderness, guarding or rebound.  ?   Comments: Benign abdominal exam  ?Genitourinary: ?   Labia:     ?   Right: No rash or tenderness.     ?   Left: No rash or tenderness.   ?   Vagina: Vaginal discharge, erythema and tenderness present.  ?    Cervix: Normal.  ?   Uterus: Normal.   ?   Comments:  2 cm x 1 cm abscess actively draining noted left buttocks near gluteal cleft.  No streaking or evidence of lymphangitis. ? ?Significant erythema with thin white discharge noted in posterior vaginal vault.  No cervical motion tenderness. ?Psychiatric:     ?   Behavior: Behavior is cooperative.  ? ? ? ?UC Treatments / Results  ?Labs ?(all labs ordered are listed, but only abnormal results are displayed) ?Labs Reviewed  ?POCT URINALYSIS DIPSTICK, ED / UC - Abnormal; Notable for the following components:  ?    Result Value  ? Glucose, UA 100 (*)   ? Hgb urine dipstick SMALL (*)   ? Protein, ur 100 (*)   ? Leukocytes,Ua SMALL (*)   ? All other components within normal limits  ?CBG MONITORING, ED - Abnormal; Notable for the following components:  ? Glucose-Capillary 247 (*)   ? All other components within normal limits  ?URINE CULTURE  ?POC URINE PREG, ED  ?CERVICOVAGINAL ANCILLARY ONLY  ? ? ?EKG ? ? ?Radiology ?No results found. ? ?Procedures ?Incision and Drainage ? ?Date/Time: 09/13/2021 11:41 AM ?Performed by: Terrilee Croak, PA-C ?Authorized by: Vanessa Kick, MD  ? ?Consent:  ?  Consent obtained:  Verbal ?  Consent given by:  Patient ?  Risks, benefits, and alternatives were discussed: yes   ?  Risks discussed:  Bleeding, incomplete drainage and infection ?  Alternatives discussed:  No treatment, alternative treatment and observation ?Universal protocol:  ?  Procedure explained and questions answered to patient or proxy's satisfaction: yes   ?  Patient identity confirmed:  Verbally with patient ?Location:  ?  Type:  Abscess ?  Size:  2 cm x 1 cm ?  Location:  Anogenital ?  Anogenital location:  Gluteal cleft ?Pre-procedure details:  ?  Skin preparation:  Chlorhexidine with alcohol ?Sedation:  ?  Sedation type:  None ?Anesthesia:  ?  Anesthesia method:  Local infiltration ?  Local anesthetic:  Lidocaine 2% w/o epi ?Procedure type:  ?  Complexity:  Simple ?Procedure  details:  ?  Ultrasound guidance: no   ?  Needle aspiration: no   ?  Incision types:  Stab incision ?  Incision depth:  Dermal ?  Wound management:  Irrigated with saline ?  Drainage:  Purulent ?  Drainage amount:

## 2021-09-14 ENCOUNTER — Telehealth (HOSPITAL_COMMUNITY): Payer: Self-pay | Admitting: Emergency Medicine

## 2021-09-14 LAB — CERVICOVAGINAL ANCILLARY ONLY
Bacterial Vaginitis (gardnerella): POSITIVE — AB
Candida Glabrata: NEGATIVE
Candida Vaginitis: NEGATIVE
Chlamydia: NEGATIVE
Comment: NEGATIVE
Comment: NEGATIVE
Comment: NEGATIVE
Comment: NEGATIVE
Comment: NEGATIVE
Comment: NORMAL
Neisseria Gonorrhea: NEGATIVE
Trichomonas: POSITIVE — AB

## 2021-09-14 LAB — URINE CULTURE

## 2021-09-14 MED ORDER — METRONIDAZOLE 500 MG PO TABS: 500.0000 mg | ORAL_TABLET | Freq: Two times a day (BID) | ORAL | 0 refills | Status: DC

## 2021-09-21 ENCOUNTER — Encounter (HOSPITAL_COMMUNITY): Payer: Self-pay

## 2021-10-03 ENCOUNTER — Encounter (HOSPITAL_COMMUNITY): Payer: Self-pay

## 2021-10-03 ENCOUNTER — Ambulatory Visit (HOSPITAL_COMMUNITY)
Admission: EM | Admit: 2021-10-03 | Discharge: 2021-10-03 | Disposition: A | Discharge status: Home / Self Care | Payer: 59 | Attending: Emergency Medicine | Admitting: Emergency Medicine

## 2021-10-03 DIAGNOSIS — N760 Acute vaginitis: Secondary | ICD-10-CM | POA: Insufficient documentation

## 2021-10-03 DIAGNOSIS — R739 Hyperglycemia, unspecified: Secondary | ICD-10-CM | POA: Insufficient documentation

## 2021-10-03 DIAGNOSIS — Z113 Encounter for screening for infections with a predominantly sexual mode of transmission: Secondary | ICD-10-CM | POA: Diagnosis not present

## 2021-10-03 DIAGNOSIS — E1165 Type 2 diabetes mellitus with hyperglycemia: Secondary | ICD-10-CM

## 2021-10-03 LAB — CBG MONITORING, ED: Glucose-Capillary: 361 mg/dL — ABNORMAL HIGH (ref 70–99)

## 2021-10-03 MED ORDER — METRONIDAZOLE 500 MG PO TABS: 500.0000 mg | ORAL_TABLET | Freq: Two times a day (BID) | ORAL | 0 refills | Status: DC

## 2021-10-03 NOTE — ED Triage Notes (Signed)
Last seen on 3/16 for vaginal burning tx w/flagyl. Pt reports that sxs improved but that she had intercourse right after and the sxs have returned as of 2 days ago. Along with vaginal itching and burning, Pt reports a feeling of "something ripping away at the clit".   ?

## 2021-10-03 NOTE — Discharge Instructions (Addendum)
Take Flagyl as directed and abstain from unprotected sex so you do not have a reoccurrence.  Continue to treat your elevated blood sugars but return to the ER if urine is not improving despite medication for urine screening.  You politely declined urine screening today.  You will be called with the final test result from the vaginal swab when results received.your blood sugar was elevated today- stop eating sweet and take your glucophage 2 pills today-  ?

## 2021-10-03 NOTE — ED Provider Notes (Signed)
?[facility] ? ? ?MRN: [id number] DOB: 1981/06/19 ? ?Subjective:  ? ?Chief Complaint;  ?Chief Complaint  ?Patient presents with  ? S74.5  ?Last seen on 3/16 for vaginal burning tx w/flagyl. Pt reports that sxs improved but that she had intercourse right after and the sxs have returned as of 2 days ago. Along with vaginal itching and burning, Pt reports a feeling of "something ripping away at the clit".   ? ?Monica Hunter is a 41 y.o. female presenting for uncomfortable sensation in her vagina.  Patient had recent trichomonas diagnosis and did finish her full course but was reexposed with a partner 2 days after her treatment finished that had trichomoniasis.  She states that it feels exactly the same as her last infection and would just like course of Flagyl ordered again. ? ?No current facility-administered medications for this encounter. ? ?Current Outpatient Medications:  ?  metroNIDAZOLE (FLAGYL) 500 MG tablet, Take 1 tablet (500 mg total) by mouth 2 (two) times daily., Disp: 14 tablet, Rfl: 0 ?  albuterol (PROVENTIL HFA;VENTOLIN HFA) 108 (90 BASE) MCG/ACT inhaler, Inhale 2 puffs into the lungs every 6 (six) hours as needed. For shortness of breath, Disp: , Rfl:  ?  doxycycline (VIBRAMYCIN) 100 MG capsule, Take 1 capsule (100 mg total) by mouth 2 (two) times daily., Disp: 20 capsule, Rfl: 0 ?  ketorolac (ACULAR) 0.5 % ophthalmic solution, Place 1 drop into the left eye every 6 (six) hours., Disp: 3 mL, Rfl: 0 ?  metFORMIN (GLUCOPHAGE-XR) 750 MG 24 hr tablet, Take 1 tablet (750 mg total) by mouth daily with breakfast., Disp: 30 tablet, Rfl: 1 ?  trimethoprim-polymyxin b (POLYTRIM) ophthalmic solution, Place 1 drop into the left eye every 4 (four) hours., Disp: 10 mL, Rfl: 0  ? ?Allergies  ?Allergen Reactions  ? Ceftriaxone Hives and Itching  ? Sulfa Antibiotics Other (See Comments)  ?  Reaction unknown  ? ? ?Past Medical History:  ?Diagnosis Date  ? Asthma   ? Diabetes mellitus   ? Morbid  obesity ([facility])   ?  ? ?Review of Systems  ?All other systems reviewed and are negative. ? ? ?Objective:  ? ?Vitals: ?BP 136/82 (BP Location: Left Arm)   Pulse 94   Temp 98.4 ?F (36.9 ?C) (Oral)   Resp 18   SpO2 95%  ? ?Physical Exam ?Vitals and nursing note reviewed.  ?Constitutional:   ?   General: She is not in acute distress. ?   Appearance: She is well-developed.  ?HENT:  ?   Head: Normocephalic and atraumatic.  ?Eyes:  ?   Conjunctiva/sclera: Conjunctivae normal.  ?Cardiovascular:  ?   Rate and Rhythm: Normal rate and regular rhythm.  ?   Heart sounds: No murmur heard. ?Pulmonary:  ?   Effort: Pulmonary effort is normal. No respiratory distress.  ?   Breath sounds: Normal breath sounds.  ?Abdominal:  ?   Palpations: Abdomen is soft.  ?   Tenderness: There is no abdominal tenderness.  ?Genitourinary: ?   Comments: GU exam deferred per patient request ?Musculoskeletal:     ?   General: No swelling.  ?   Cervical back: Neck supple.  ?Skin: ?   General: Skin is warm and dry.  ?   Capillary Refill: Capillary refill takes less than 2 seconds.  ?Neurological:  ?   Mental Status: She is alert.  ?Psychiatric:     ?   Mood and Affect: Mood normal.  ? ? ?  No results found for this or any previous visit (from the past 24 hour(s)). ? ?No results found.  ?  ? ?Assessment and Plan :  ? ?1. Vaginitis and vulvovaginitis   ?2. Encounter for screening for infections with predominantly sexual mode of transmission   ? ? ?Meds ordered this encounter  ?Medications  ? metroNIDAZOLE (FLAGYL) 500 MG tablet  ?  Sig: Take 1 tablet (500 mg total) by mouth 2 (two) times daily.  ?  Dispense:  14 tablet  ?  Refill:  0  ? ? ?Monica Hunter is a 41 y.o. female presenting for signs of vaginal ptosis with history of trichomonas.  Patient stated she did not want any other testing but having Flagyl prescribed.  Given she recently reinfected herself I prescribed Flagyl.  She will return for new or worsening symptoms.  Sugar was elevated on  exam.  Patient encouraged to take 2 Glucophage today and go to the ER for any new or worsening symptoms for further treatment.  Patient states she refused to take more than one of her Glucophage and would like to be back on her insulin regime treatment due to the side effects of her Glucophage.  Writing for a follow-up appointment with PCP to consider other treatment options I discussed treatment, follow up and return instructions. Questions were answered. Patient stated understanding of instructions and is stable for discharge. ?Dewaine Conger FNP-C MCN  ?  ?Jone Baseman, NP ?10/03/21 1730 ? ?

## 2021-10-04 LAB — CERVICOVAGINAL ANCILLARY ONLY
Bacterial Vaginitis (gardnerella): NEGATIVE
Candida Glabrata: NEGATIVE
Candida Vaginitis: POSITIVE — AB
Chlamydia: NEGATIVE
Comment: NEGATIVE
Comment: NEGATIVE
Comment: NEGATIVE
Comment: NEGATIVE
Comment: NEGATIVE
Comment: NORMAL
Neisseria Gonorrhea: NEGATIVE
Trichomonas: NEGATIVE

## 2021-10-05 ENCOUNTER — Telehealth (HOSPITAL_COMMUNITY): Payer: Self-pay | Admitting: Emergency Medicine

## 2021-10-05 MED ORDER — FLUCONAZOLE 150 MG PO TABS: 150.0000 mg | ORAL_TABLET | Freq: Once | ORAL | 0 refills | Status: AC

## 2021-10-15 ENCOUNTER — Telehealth: Payer: Self-pay

## 2021-10-15 NOTE — Telephone Encounter (Signed)
Pts mother Monica Hunter) called this afternoon requesting a new patient appointment for Monica Hunter for tomorrow. I spoke with Monica Hunter, who stated that 1 week ago her glucose was 341 and a week prior to that it was 241. She was seen at UC twice in the last 2 weeks and needs a refill on her medications. She is unable to check her glucose at home and stated she does not want to check it due to making her fingers feel sore. She is having Excess thirst, urinating a lot, Appetite loss, and when she does eat it makes her feel sick. I spoke with Monica Hunter who stated that we are unable to get her in tomorrow, she needs to F.up with her PCP or go to UC. Monica Hunter was notified. She stated she does not have a PCP and if we are unwilling to see her then she will go somewhere else. Patient was notified that we ARE willing to see her but we are unable to fit her in tomorrow due to no openings she was notified again when Monica Hunter's first new patient appointment is and did not take it.  ?

## 2021-10-16 ENCOUNTER — Emergency Department (HOSPITAL_COMMUNITY)
Admission: EM | Admit: 2021-10-16 | Discharge: 2021-10-16 | Disposition: A | Discharge status: Home / Self Care | Payer: 59 | Attending: Emergency Medicine | Admitting: Emergency Medicine

## 2021-10-16 ENCOUNTER — Encounter (HOSPITAL_COMMUNITY): Payer: Self-pay | Admitting: Emergency Medicine

## 2021-10-16 ENCOUNTER — Emergency Department (HOSPITAL_COMMUNITY): Payer: 59

## 2021-10-16 DIAGNOSIS — R000 Tachycardia, unspecified: Secondary | ICD-10-CM | POA: Insufficient documentation

## 2021-10-16 DIAGNOSIS — E860 Dehydration: Secondary | ICD-10-CM | POA: Insufficient documentation

## 2021-10-16 DIAGNOSIS — Z20822 Contact with and (suspected) exposure to covid-19: Secondary | ICD-10-CM | POA: Diagnosis not present

## 2021-10-16 DIAGNOSIS — N764 Abscess of vulva: Secondary | ICD-10-CM | POA: Diagnosis not present

## 2021-10-16 DIAGNOSIS — L0231 Cutaneous abscess of buttock: Secondary | ICD-10-CM | POA: Insufficient documentation

## 2021-10-16 DIAGNOSIS — R7309 Other abnormal glucose: Secondary | ICD-10-CM

## 2021-10-16 DIAGNOSIS — N9489 Other specified conditions associated with female genital organs and menstrual cycle: Secondary | ICD-10-CM | POA: Diagnosis not present

## 2021-10-16 DIAGNOSIS — R531 Weakness: Secondary | ICD-10-CM | POA: Diagnosis present

## 2021-10-16 LAB — CBC
HCT: 47.9 % — ABNORMAL HIGH (ref 36.0–46.0)
Hemoglobin: 15.4 g/dL — ABNORMAL HIGH (ref 12.0–15.0)
MCH: 29.4 pg (ref 26.0–34.0)
MCHC: 32.2 g/dL (ref 30.0–36.0)
MCV: 91.6 fL (ref 80.0–100.0)
Platelets: 283 10*3/uL (ref 150–400)
RBC: 5.23 MIL/uL — ABNORMAL HIGH (ref 3.87–5.11)
RDW: 13.3 % (ref 11.5–15.5)
WBC: 7.4 10*3/uL (ref 4.0–10.5)
nRBC: 0 % (ref 0.0–0.2)

## 2021-10-16 LAB — CBG MONITORING, ED: Glucose-Capillary: 289 mg/dL — ABNORMAL HIGH (ref 70–99)

## 2021-10-16 LAB — URINALYSIS, ROUTINE W REFLEX MICROSCOPIC
Bilirubin Urine: NEGATIVE
Glucose, UA: >=500 mg/dL — AB
Hgb urine dipstick: NEGATIVE
Ketones, ur: 80 mg/dL — AB
Nitrite: NEGATIVE
Protein, ur: 100 mg/dL — AB
Specific Gravity, Urine: 1.023 (ref 1.005–1.030)
pH: 5 (ref 5.0–8.0)

## 2021-10-16 LAB — MAGNESIUM: Magnesium: 1.8 mg/dL (ref 1.7–2.4)

## 2021-10-16 LAB — RESP PANEL BY RT-PCR (FLU A&B, COVID) ARPGX2
Influenza A by PCR: NEGATIVE
Influenza B by PCR: NEGATIVE
SARS Coronavirus 2 by RT PCR: NEGATIVE

## 2021-10-16 LAB — BASIC METABOLIC PANEL
Anion gap: 15 (ref 5–15)
BUN: 7 mg/dL (ref 6–20)
CO2: 13 mmol/L — ABNORMAL LOW (ref 22–32)
Calcium: 9.1 mg/dL (ref 8.9–10.3)
Chloride: 104 mmol/L (ref 98–111)
Creatinine, Ser: 0.87 mg/dL (ref 0.44–1.00)
GFR, Estimated: >60 mL/min (ref >60)
Glucose, Bld: 330 mg/dL — ABNORMAL HIGH (ref 70–99)
Potassium: 4.1 mmol/L (ref 3.5–5.1)
Sodium: 132 mmol/L — ABNORMAL LOW (ref 135–145)

## 2021-10-16 LAB — I-STAT BETA HCG BLOOD, ED (MC, WL, AP ONLY): I-stat hCG, quantitative: <5 m[IU]/mL (ref <5)

## 2021-10-16 MED ORDER — ONDANSETRON HCL 4 MG/2ML IJ SOLN
4.0000 mg | Freq: Once | INTRAMUSCULAR | Status: AC
  Administered 2021-10-16: 4 mg via INTRAVENOUS
  Filled 2021-10-16: qty 2

## 2021-10-16 MED ORDER — ONDANSETRON 4 MG PO TBDP: 4.0000 mg | ORAL_TABLET | Freq: Three times a day (TID) | ORAL | 0 refills | Status: DC | PRN

## 2021-10-16 MED ORDER — LACTATED RINGERS IV BOLUS
2000.0000 mL | Freq: Once | INTRAVENOUS | Status: AC
Start: 2021-10-16 — End: 2021-10-16
  Administered 2021-10-16: 2000 mL via INTRAVENOUS

## 2021-10-16 MED ORDER — HYDROXYZINE HCL 25 MG PO TABS: 25.0000 mg | ORAL_TABLET | Freq: Once | ORAL | Status: DC

## 2021-10-16 NOTE — ED Provider Notes (Addendum)
?[facility] ?Provider Note ? ? ?CSN: [id number] ?Arrival date & time: 10/16/21  1049 ? ?  ? ?History ? ?Chief Complaint  ?Patient presents with  ? Hyperglycemia  ? Weakness  ? Anorexia  ? Leg Pain  ? Cough  ? ? ?Monica Hunter is a 41 y.o. female. ? ?41 year old female presents today for evaluation of multiple complaints including malaise, cramping, and abscesses 1 on the posterior leg, 1 on her vagina over the past 2 weeks.  She states for the abscess she was initially started on doxycycline after having one of the 2 drained.  She states the abscess on her vagina did spontaneously drain however she still having pain.  States over the past 3 days she has had difficult time sleeping because of cramping in her legs.  She also has peripheral neuropathy.  She recently followed up with urgent care and was started on metformin which she states she refuses to take because of the diarrhea causes.  She states she has been on insulin in the past but that was a long time ago.  She does have an appointment upcoming on 4/26 to establish care with PCP.  She also has cough for the past 2 weeks. ? ?The history is provided by the patient. No language interpreter was used.  ? ?  ? ?Home Medications ?Prior to Admission medications   ?Medication Sig Start Date End Date Taking? Authorizing Provider  ?albuterol (PROVENTIL HFA;VENTOLIN HFA) 108 (90 BASE) MCG/ACT inhaler Inhale 2 puffs into the lungs every 6 (six) hours as needed. For shortness of breath    [provider]  ?doxycycline (VIBRAMYCIN) 100 MG capsule Take 1 capsule (100 mg total) by mouth 2 (two) times daily. 09/13/21   Raspet, Derry Skill, PA-C  ?ketorolac (ACULAR) 0.5 % ophthalmic solution Place 1 drop into the left eye every 6 (six) hours. 09/21/17   Montine Circle, PA-C  ?metFORMIN (GLUCOPHAGE-XR) 750 MG 24 hr tablet Take 1 tablet (750 mg total) by mouth daily with breakfast. 09/13/21   Raspet, Junie Panning K, PA-C  ?metroNIDAZOLE  (FLAGYL) 500 MG tablet Take 1 tablet (500 mg total) by mouth 2 (two) times daily. 10/03/21   Hezzie Bump, NP  ?trimethoprim-polymyxin b (POLYTRIM) ophthalmic solution Place 1 drop into the left eye every 4 (four) hours. 09/21/17   Montine Circle, PA-C  ?   ? ?Allergies    ?Ceftriaxone and Sulfa antibiotics   ? ?Review of Systems   ?Review of Systems  ?Constitutional:  Negative for chills and fever.  ?Respiratory:  Positive for cough. Negative for shortness of breath.   ?Cardiovascular:  Negative for chest pain.  ?Gastrointestinal:  Positive for nausea. Negative for abdominal pain and vomiting.  ?Neurological:  Negative for light-headedness and headaches.  ?All other systems reviewed and are negative. ? ?Physical Exam ?Updated Vital Signs ?BP (!) 147/86   Pulse (!) 106   Temp 97.7 ?F (36.5 ?C) (Oral)   Resp 16   Ht 5\' 8"  (1.727 m)   Wt (!) 168.3 kg   SpO2 99%   BMI 56.41 kg/m?  ?Physical Exam ?Vitals and nursing note reviewed. Exam conducted with a chaperone present Vonna Kotyk newton RN present as chaperone).  ?Constitutional:   ?   General: She is not in acute distress. ?   Appearance: Normal appearance. She is not ill-appearing.  ?HENT:  ?   Head: Normocephalic and atraumatic.  ?   Nose: Nose normal.  ?Eyes:  ?   General: No  scleral icterus. ?   Extraocular Movements: Extraocular movements intact.  ?   Conjunctiva/sclera: Conjunctivae normal.  ?Cardiovascular:  ?   Rate and Rhythm: Regular rhythm. Tachycardia present.  ?   Pulses: Normal pulses.  ?   Heart sounds: Normal heart sounds.  ?Pulmonary:  ?   Effort: Pulmonary effort is normal. No respiratory distress.  ?   Breath sounds: Normal breath sounds. No wheezing or rales.  ?Abdominal:  ?   General: There is no distension.  ?   Tenderness: There is no abdominal tenderness. There is no guarding.  ?Genitourinary: ? ? ?Musculoskeletal:     ?   General: Normal range of motion.  ?   Cervical back: Normal range of motion.  ?Skin: ?   General: Skin is warm and  dry.  ?   Comments: Area of abscess noted in posterior left buttock near the gluteal cleft.  No area of fluctuance noted.  No active drainage.  Without overlying cellulitis.  ?Neurological:  ?   General: No focal deficit present.  ?   Mental Status: She is alert. Mental status is at baseline.  ? ? ?ED Results / Procedures / Treatments   ?Labs ?(all labs ordered are listed, but only abnormal results are displayed) ?Labs Reviewed  ?BASIC METABOLIC PANEL - Abnormal; Notable for the following components:  ?    Result Value  ? Sodium 132 (*)   ? CO2 13 (*)   ? Glucose, Bld 330 (*)   ? All other components within normal limits  ?CBC - Abnormal; Notable for the following components:  ? RBC 5.23 (*)   ? Hemoglobin 15.4 (*)   ? HCT 47.9 (*)   ? All other components within normal limits  ?URINALYSIS, ROUTINE W REFLEX MICROSCOPIC - Abnormal; Notable for the following components:  ? APPearance HAZY (*)   ? Glucose, UA >=500 (*)   ? Ketones, ur 80 (*)   ? Protein, ur 100 (*)   ? Leukocytes,Ua SMALL (*)   ? Bacteria, UA RARE (*)   ? All other components within normal limits  ?RESP PANEL BY RT-PCR (FLU A&B, COVID) ARPGX2  ?MAGNESIUM  ?I-STAT BETA HCG BLOOD, ED ([facility], [facility], [facility] ONLY)  ?CBG MONITORING, ED  ?CBG MONITORING, ED  ? ? ?EKG ?None ? ?Radiology ?No results found. ? ?Procedures ?Procedures  ? ? ?Medications Ordered in ED ?Medications  ?lactated ringers bolus 2,000 mL (has no administration in time range)  ? ? ?ED Course/ Medical Decision Making/ A&P ?Clinical Course as of 10/16/21 1946  ?Tue Oct 16, 2021  ?1942 Patient following 1 L of IV fluids and Zofran reports significant improvement in symptoms.  However she still remains mildly tachycardic at about 110.  Will await second bolus to see if her heart rate improves.  She is otherwise symptomatically better. [AA]  ?  ?Clinical Course User Index ?Maudie Mercury, PA-C  ? ?                        ?Medical Decision Making ?Amount and/or Complexity of Data Reviewed ?Radiology:  ordered. ? ?Risk ?Prescription drug management. ? ? ?41 year old female presents today for evaluation of multiple complaints.  Primary complaint includes malaise, cramping in her legs which has been going on over the past 2 weeks and cramping over the past few days interfering with her ability to sleep.  She has had multiple visits to urgent care.  She recently stopped taking her metformin due  to concern for GI side effects.  He also complains of 2 abscesses 1 of which is on the posterior left leg, and the other on her vagina.  She states the one on the leg has been drained already and has taken antibiotic course, and the one on her vagina spontaneously drained.  She denies drainage, worsening swelling, fever or other signs or symptoms consistent with acute abscess.  Will evaluate these on exam.  Work-up so far is reassuring and without leukocytosis or anemia.  BMP without electrolyte derangements.  Sodium is 132 likely pseudohyponatremia given elevated glucose.  UA without UTI.  COVID and flu negative.  Initial glucose of 330 which improved to 289 later on in the emergency visit.  She is without signs of DKA or HHS.  Chemistry panel without anion gap.  Chest x-ray without acute cardiopulmonary process. ?Patient after 2 L IV fluids remains slightly tachycardic at about 110.  Given work-up without signs or symptoms of infection it is likely related to patient's dehydration.  Will provide patient with Zofran.  Did plan on discharging patient with glipizide however she has allergic to sulfa drugs which cause hives.  She refuses to take metformin due to GI side effects.  Discussed watching her diet until she follows up with her PCP. discussed increasing hydration after discharge.  Patient voices understanding and is in agreement with plan.  Return precautions discussed. ? ? ?Final Clinical Impression(s) / ED Diagnoses ?Final diagnoses:  ?Dehydration  ? ? ?Rx / DC Orders ?ED Discharge Orders   ? ?      Ordered  ?   ondansetron (ZOFRAN-ODT) 4 MG disintegrating tablet  Every 8 hours PRN       ? 10/16/21 2211  ? ?  ?  ? ?  ? ? ?  ?Evlyn Courier, PA-C ?10/16/21 2213 ? ?  ?Evlyn Courier, PA-C ?10/16/21 2215 ? ?  ?Wyvonnia Dusky, MD ?10/17/21 1

## 2021-10-16 NOTE — ED Triage Notes (Signed)
Pt. Stated, For 2 weeks Ive had leg pain with Charlie horse and unable to sleep, Ive had loss of appetite and Ive had these boils on me and Ive taken an antibiotic but its caused me to have a yeast infection. I went to [facility] and they put me on Metformin and I refused to take because it gives me diarrhea so bad.  ?

## 2021-10-16 NOTE — Discharge Instructions (Addendum)
Your work-up today was reassuring.  You received Zofran and IV fluids with improvement in your symptoms.  The ureters you showed me that are concerning for an abscess did not look acutely infected.  I have sent Zofran into the pharmacy for you.  We did discuss glipizide as an alternative to metformin however you are allergic to sulfa.  Recommend monitoring your diet until you follow-up with your PCP..  If you have any worsening symptoms please return to the emergency room.  Please increase your hydration. ?

## 2021-10-16 NOTE — ED Notes (Signed)
Patient transported to X-ray 

## 2021-10-16 NOTE — ED Notes (Signed)
DC instructions reviewed with pt. PT verbalized understanding. PT DC °

## 2021-10-16 NOTE — ED Provider Triage Note (Signed)
Emergency Medicine Provider Triage Evaluation Note ? ?Monica Hunter , a 41 y.o. female  was evaluated in triage.  Patient has a history of type 2 diabetes.  Says that for the past 2 weeks she has been nauseated, experiencing leg cramps, feeling weak, and having increased blood sugar readings.  Says that her blood sugar was under control however it is started to creep up.  Was seen at urgent care, offered metformin however she declined this.  Reports previously being admitted to the hospital for hyperglycemia and inpatient management. ? ?Review of Systems  ?Positive: Weakness, muscle cramps, nausea, vomiting, hyperglycemia, vaginal itching ?Negative:  ? ?Physical Exam  ?BP 126/87 (BP Location: Right Arm)   Pulse (!) 112   Temp 97.7 ?F (36.5 ?C) (Oral)   Resp 17   Ht 5\' 8"  (1.727 m)   Wt (!) 168.3 kg   SpO2 100%   BMI 56.41 kg/m?  ?Gen:   Awake, no distress   ?Resp:  Normal effort  ?MSK:   Moves extremities without difficulty  ?Other:   ? ?Medical Decision Making  ?Medically screening exam initiated at 11:34 AM.  Appropriate orders placed.  Monica Hunter was informed that the remainder of the evaluation will be completed by another provider, this initial triage assessment does not replace that evaluation, and the importance of remaining in the ED until their evaluation is complete. ? ? ?  ?Rodman Pickle, PA-C ?10/16/21 1144 ? ?

## 2021-10-19 LAB — MICROALBUMIN, URINE: Microalb, Ur: 30

## 2021-10-22 ENCOUNTER — Encounter (HOSPITAL_BASED_OUTPATIENT_CLINIC_OR_DEPARTMENT_OTHER): Payer: Self-pay

## 2021-10-22 ENCOUNTER — Ambulatory Visit (HOSPITAL_BASED_OUTPATIENT_CLINIC_OR_DEPARTMENT_OTHER): Payer: 59 | Admitting: Nurse Practitioner

## 2021-10-25 ENCOUNTER — Ambulatory Visit (INDEPENDENT_AMBULATORY_CARE_PROVIDER_SITE_OTHER): Payer: 59 | Admitting: Nurse Practitioner

## 2021-10-25 ENCOUNTER — Encounter (HOSPITAL_BASED_OUTPATIENT_CLINIC_OR_DEPARTMENT_OTHER): Payer: Self-pay | Admitting: Nurse Practitioner

## 2021-10-25 VITALS — BP 128/72 | HR 83 | Ht 68.0 in | Wt 377.0 lb

## 2021-10-25 DIAGNOSIS — L309 Dermatitis, unspecified: Secondary | ICD-10-CM

## 2021-10-25 DIAGNOSIS — E11620 Type 2 diabetes mellitus with diabetic dermatitis: Secondary | ICD-10-CM | POA: Diagnosis not present

## 2021-10-25 DIAGNOSIS — N912 Amenorrhea, unspecified: Secondary | ICD-10-CM

## 2021-10-25 DIAGNOSIS — B372 Candidiasis of skin and nail: Secondary | ICD-10-CM

## 2021-10-25 DIAGNOSIS — L732 Hidradenitis suppurativa: Secondary | ICD-10-CM

## 2021-10-25 DIAGNOSIS — Z6841 Body Mass Index (BMI) 40.0 and over, adult: Secondary | ICD-10-CM

## 2021-10-25 DIAGNOSIS — Z794 Long term (current) use of insulin: Secondary | ICD-10-CM

## 2021-10-25 DIAGNOSIS — J4520 Mild intermittent asthma, uncomplicated: Secondary | ICD-10-CM

## 2021-10-25 DIAGNOSIS — K219 Gastro-esophageal reflux disease without esophagitis: Secondary | ICD-10-CM

## 2021-10-25 MED ORDER — VITAMINS A & D EX OINT: 1.0000 "application " | TOPICAL_OINTMENT | CUTANEOUS | 3 refills | Status: DC | PRN

## 2021-10-25 MED ORDER — MONTELUKAST SODIUM 10 MG PO TABS: 10.0000 mg | ORAL_TABLET | Freq: Every day | ORAL | 3 refills | Status: DC

## 2021-10-25 MED ORDER — TRIAMCINOLONE ACETONIDE 0.1 % EX CREA: 1.0000 "application " | TOPICAL_CREAM | Freq: Two times a day (BID) | CUTANEOUS | 3 refills | Status: DC

## 2021-10-25 MED ORDER — INSULIN PEN NEEDLE 31G X 6 MM MISC: 1.0000 | Freq: Three times a day (TID) | 11 refills | Status: DC

## 2021-10-25 MED ORDER — PANTOPRAZOLE SODIUM 40 MG PO TBEC: 40.0000 mg | DELAYED_RELEASE_TABLET | Freq: Two times a day (BID) | ORAL | 1 refills | Status: DC

## 2021-10-25 MED ORDER — NYSTATIN 100000 UNIT/GM EX OINT: 1.0000 "application " | TOPICAL_OINTMENT | Freq: Two times a day (BID) | CUTANEOUS | 0 refills | Status: DC

## 2021-10-25 NOTE — Progress Notes (Signed)
?Orma Render, DNP, AGNP-c ?Primary Care & Sports Medicine ?[place], [place] [address] ?[phone] ? ?New patient visit ? ? ?Patient: Monica Hunter   DOB: [birth year]   41 y.o. Female  MRN: [id number] ?Visit Date: 10/25/2021 ? ?Patient Care Team: ?Hyland Mollenkopf, Coralee Pesa, NP as PCP - General (Nurse Practitioner) ? ?Today's Vitals  ? 10/25/21 1110  ?BP: 128/72  ?Pulse: 83  ?SpO2: 97%  ?Weight: (!) 377 lb (171 kg)  ?Height: $RemoveB'5\' 8"'dRWThWDg$  (1.727 m)  ? ?Body mass index is 57.32 kg/m?.  ? ?Today's healthcare provider: Orma Render, NP  ? ?Chief Complaint  ?Patient presents with  ? Establish Care  ? Diabetes  ? Back Pain  ? ?Subjective  ?  ?Monica Hunter is a 41 y.o. female who presents today as a new patient to establish care.  ?  ?Patient endorses the following concerns presently: ?DM ?- Recent d/c from hospital for DKA (Sunday) ?- Metformin causing diarrhea and "sore butt" ?- "tired of being stuck" to check BG ?- would like to try dexcom and insulin pump ?- BG not under control ?- currently eating only 3 small meals a day and exercise not where it should be ?- "I need to be better" ?- frequent yeast infections and skin infections ?- working to try to get disability due to uncontrolled BG ?- Currently on metformin, insulin aspart 15u TID with meals, no longer taking Humulin ? ?DDD ?- chronic ?- frequent pain limits activity and exercise ?- "I am always in so much pain" ?- Scoliosis also present with 6mm drift to the right ?- Wears a back brace ? ?Abscess ?- frequent and chronic ?- currently one on the back of the right leg ?- 2 on labia ?- has had them in the past ?- not currently draining ?- no signs of infection present ? ? ? ?History reviewed and reveals the following: ?Past Medical History:  ?Diagnosis Date  ? Asthma   ? Diabetes mellitus   ? Morbid obesity ([place])   ? ?History reviewed. No pertinent surgical history. ?Family Status  ?Relation Name Status  ? Mother  Alive  ?  Father  Alive  ? ?Family History  ?Problem Relation Age of Onset  ? Diabetes Mother   ? Diabetes Father   ? ?Social History  ? ?Socioeconomic History  ? Marital status: Single  ?  Spouse name: Not on file  ? Number of children: Not on file  ? Years of education: Not on file  ? Highest education level: Not on file  ?Occupational History  ? Not on file  ?Tobacco Use  ? Smoking status: Every Day  ? Smokeless tobacco: Never  ?Substance and Sexual Activity  ? Alcohol use: No  ? Drug use: No  ? Sexual activity: Yes  ?Other Topics Concern  ? Not on file  ?Social History Narrative  ? Not on file  ? ?Social Determinants of Health  ? ?Financial Resource Strain: Not on file  ?Food Insecurity: Not on file  ?Transportation Needs: Not on file  ?Physical Activity: Not on file  ?Stress: Not on file  ?Social Connections: Not on file  ? ?Outpatient Medications Prior to Visit  ?Medication Sig  ? albuterol (PROVENTIL HFA;VENTOLIN HFA) 108 (90 BASE) MCG/ACT inhaler Inhale 2 puffs into the lungs every 6 (six) hours as needed. For shortness of breath  ? ibuprofen (ADVIL) 800 MG tablet Take by mouth.  ? insulin NPH Human (NOVOLIN N) 100 UNIT/ML  injection Inject into the skin.  ? ondansetron (ZOFRAN-ODT) 4 MG disintegrating tablet Take 1 tablet (4 mg total) by mouth every 8 (eight) hours as needed for nausea or vomiting.  ? valACYclovir (VALTREX) 1000 MG tablet Take by mouth.  ? [DISCONTINUED] montelukast (SINGULAIR) 10 MG tablet Take by mouth.  ? Blood Glucose Monitoring Suppl (CONTOUR NEXT GEN MONITOR) w/Device KIT See admin instructions.  ? CONTOUR NEXT TEST test strip 3 (three) times daily.  ? fluconazole (DIFLUCAN) 150 MG tablet Take 150 mg by mouth once.  ? hydrOXYzine (ATARAX) 25 MG tablet SMARTSIG:1 Tablet(s) By Mouth 1-3 Times Daily  ? Insulin Aspart FlexPen (NOVOLOG) 100 UNIT/ML SMARTSIG:15 Unit(s) SUB-Q 3 Times Daily  ? Microlet Lancets MISC USE TO CHECK BLOOD SUGAR AS DIRECTED WITH INSULIN THREE TIMES DAILY AND FOR SYMPTOMS OF  HIGH OR LOW BLOOD SUGAR  ? [DISCONTINUED] doxycycline (VIBRAMYCIN) 100 MG capsule Take 1 capsule (100 mg total) by mouth 2 (two) times daily. (Patient not taking: Reported on 10/25/2021)  ? [DISCONTINUED] ketorolac (ACULAR) 0.5 % ophthalmic solution Place 1 drop into the left eye every 6 (six) hours.  ? [DISCONTINUED] metFORMIN (GLUCOPHAGE-XR) 750 MG 24 hr tablet Take 1 tablet (750 mg total) by mouth daily with breakfast. (Patient not taking: Reported on 10/25/2021)  ? [DISCONTINUED] metroNIDAZOLE (FLAGYL) 500 MG tablet Take 1 tablet (500 mg total) by mouth 2 (two) times daily. (Patient not taking: Reported on 10/25/2021)  ? [DISCONTINUED] trimethoprim-polymyxin b (POLYTRIM) ophthalmic solution Place 1 drop into the left eye every 4 (four) hours.  ? ?No facility-administered medications prior to visit.  ? ?Allergies  ?Allergen Reactions  ? Ceftriaxone Hives and Itching  ? Methocarbamol Itching and Rash  ? Sulfa Antibiotics Other (See Comments), Hives, Itching and Rash  ?  Reaction unknown  ? ?Immunization History  ?Administered Date(s) Administered  ? PFIZER(Purple Top)SARS-COV-2 Vaccination 04/27/2020, 05/18/2020  ? PPD Test 04/22/2016, 04/22/2016, 06/04/2018, 06/04/2018  ? ? ?Review of Systems ?All review of systems negative except what is listed in the HPI ? ? Objective  ?  ?BP 128/72   Pulse 83   Ht $R'5\' 8"'lA$  (1.727 m)   Wt (!) 377 lb (171 kg)   SpO2 97%   BMI 57.32 kg/m?  ?Physical Exam ?Vitals and nursing note reviewed.  ?Constitutional:   ?   Appearance: Normal appearance. She is obese.  ?HENT:  ?   Head: Normocephalic.  ?Eyes:  ?   Extraocular Movements: Extraocular movements intact.  ?   Conjunctiva/sclera: Conjunctivae normal.  ?   Pupils: Pupils are equal, round, and reactive to light.  ?Neck:  ?   Vascular: No carotid bruit.  ?Cardiovascular:  ?   Rate and Rhythm: Normal rate and regular rhythm.  ?   Pulses: Normal pulses.  ?   Heart sounds: Normal heart sounds. No murmur heard. ?Pulmonary:  ?   Effort:  Pulmonary effort is normal.  ?   Breath sounds: Normal breath sounds.  ?Abdominal:  ?   General: Bowel sounds are normal. There is no distension.  ?   Palpations: Abdomen is soft.  ?   Tenderness: There is no abdominal tenderness. There is no right CVA tenderness, left CVA tenderness or guarding.  ?Musculoskeletal:     ?   General: Deformity present.  ?   Cervical back: Normal range of motion.  ?   Right lower leg: Edema present.  ?   Left lower leg: Edema present.  ?Lymphadenopathy:  ?   Cervical: No cervical adenopathy.  ?  Skin: ?   General: Skin is warm and dry.  ?   Capillary Refill: Capillary refill takes less than 2 seconds.  ?   Findings: Lesion and rash present.  ?Neurological:  ?   General: No focal deficit present.  ?   Mental Status: She is alert and oriented to person, place, and time.  ?   Sensory: No sensory deficit.  ?   Motor: Weakness present.  ?   Coordination: Coordination normal.  ?   Gait: Gait abnormal.  ?Psychiatric:     ?   Mood and Affect: Mood normal.     ?   Behavior: Behavior normal.     ?   Thought Content: Thought content normal.     ?   Judgment: Judgment normal.  ? ? ?Results for orders placed or performed in visit on 10/25/21  ?Microalbumin, urine  ?Result Value Ref Range  ? Microalb, Ur 30   ? ? Assessment & Plan   ?  ? ?Problem List Items Addressed This Visit   ? ? Asthma  ?  Patient has a history of asthma.  Patient reports intermittent wheezing.  None noted on exam today.  Patient without any cough, congestion, fevers.  Low suspicion for bronchitis or pneumonia.  Patient to monitor closely.  She will follow-up if any symptoms change.  Patient I discussed options for asthma therapy.  Discussed option for adding inhaler.  Patient opted for Singulair.  If no improvement, will introduce low-dose ICS at next visit.  Patient to follow-up if any symptoms change or worsen.  No recent exacerbation, steroid or antibiotic therapy needed. ?  ?  ? Relevant Medications  ? montelukast  (SINGULAIR) 10 MG tablet  ? Class 3 severe obesity due to excess calories without serious comorbidity with body mass index (BMI) of 50.0 to 59.9 in adult [facility])  ?  Morbid obesity with several co morbidities that are conce

## 2021-10-25 NOTE — Patient Instructions (Addendum)
Thank you for choosing [place] [facility] for your Primary Care needs. I am excited for the opportunity to partner with you to meet your health care goals. It was a pleasure meeting you today! ? ?Recommendations from today's visit: ?I have sent in a cream to help with your rash. I want you to mix this 1/2 and 1/2 with the A&D ointment and apply to the groin and buttocks twice a day for at least 2 weeks.  ?We will work to see what continuous glucose monitor will be approved by your insurance.  ?I have sent a referral to endocrine to see if we can get better control of your blood sugars and see if we can you set up with an insulin pump- if you do well with the continuous glucose meter then we can wait on that ?I have sent in pantoprazole for your heartburn ? ?Information on diet, exercise, and health maintenance recommendations are listed below. This is information to help you be sure you are on track for optimal health and monitoring.  ? ?Please look over this and let us know if you have any questions or if you have completed any of the health maintenance outside of [facility] so that we can be sure your records are up to date.  ?___________________________________________________________ ?About Me: ?I am an Adult-Geriatric Nurse Practitioner with a background in caring for patients for more than 20 years with a strong intensive care background. I provide primary care and sports medicine services to patients age 4 and older within this office. My education had a strong focus on caring for the older adult population, which I am passionate about. I am also the director of the APP Fellowship with [facility].  ? ?My desire is to provide you with the best service through preventive medicine and supportive care. I consider you a part of the medical team and value your input. I work diligently to ensure that you are heard and your needs are met in a safe and effective manner. I want you to  feel comfortable with me as your provider and want you to know that your health concerns are important to me. ? ?For your information, our office hours are: ?Monday, Tuesday, and Thursday 8:00 AM - 5:00 PM ?Wednesday and Friday 8:00 AM - 12:00 PM.  ? ?In my time away from the office I am teaching new APP's within the system and am unavailable, but my partner, Dr. Burnard Bunting is in the office for emergent needs.  ? ?If you have questions or concerns, please call our office at [phone] or send [place] a MyChart message and we will respond as quickly as possible.  ?____________________________________________________________ ?MyChart:  ?For all urgent or time sensitive needs we ask that you please call the office to avoid delays. Our number is [phone]. ?MyChart is not constantly monitored and due to the large volume of messages a day, replies may take up to 72 business hours. ? ?MyChart Policy: ?MyChart allows for you to see your visit notes, after visit summary, provider recommendations, lab and tests results, make an appointment, request refills, and contact your provider or the office for non-urgent questions or concerns. Providers are seeing patients during normal business hours and do not have built in time to review MyChart messages.  ?We ask that you allow a minimum of 3 business days for responses to [employer]. For this reason, please do not send urgent requests through [employer]. Please call the office at [phone]. ?  New and ongoing conditions may require a visit. We have virtual and in person visit available for your convenience.  ?Complex MyChart concerns may require a visit. Your provider may request you schedule a virtual or in person visit to ensure we are providing the best care possible. ?MyChart messages sent after 11:00 AM on Friday will not be received by the provider until Monday morning.  ?  ?Lab and Test Results: ?You will receive your lab and test results on MyChart as soon as they are  completed and results have been sent by the lab or testing facility. Due to this service, you will receive your results BEFORE your provider.  ?I review lab and tests results each morning prior to seeing patients. Some results require collaboration with other providers to ensure you are receiving the most appropriate care. For this reason, we ask that you please allow a minimum of 3-5 business days from the time the ALL results have been received for your provider to receive and review lab and test results and contact you about these.  ?Most lab and test result comments from the provider will be sent through [place]. Your provider may recommend changes to the plan of care, follow-up visits, repeat testing, ask questions, or request an office visit to discuss these results. You may reply directly to this message or call the office at [phone] to provide information for the provider or set up an appointment. ?In some instances, you will be called with test results and recommendations. Please let us know if this is preferred and we will make note of this in your chart to provide this for you.    ?If you have not heard a response to your lab or test results in 5 business days from all results returning to [place], please call the office to let us know. We ask that you please avoid calling prior to this time unless there is an emergent concern. Due to high call volumes, this can delay the resulting process. ? ?After Hours: ?For all non-emergency after hours needs, please call the office at [phone] and select the option to reach the on-call provider service. On-call services are shared between multiple [place] offices and therefore it will not be possible to speak directly with your provider. On-call providers may provide medical advice and recommendations, but are unable to provide refills for maintenance medications.  ?For all emergency or urgent medical needs after normal business hours, we recommend that  you seek care at the closest Urgent Care or Emergency Department to ensure appropriate treatment in a timely manner.  ?[facility] at [place] has a 24 hour emergency room located on the ground floor for your convenience.  ? ?Urgent Concerns During the Business Day ?Providers are seeing patients from 8AM to [place] with a busy schedule and are most often not able to respond to non-urgent calls until the end of the day or the next business day. ?If you should have URGENT concerns during the day, please call and speak to the nurse or schedule a same day appointment so that we can address your concern without delay.  ? ?Thank you, again, for choosing me as your health care partner. I appreciate your trust and look forward to learning more about you.  ? ?Worthy Keeler, DNP, AGNP-c ?___________________________________________________________ ? ?Health Maintenance Recommendations ?Screening Testing ?Mammogram ?Every 1 -2 years based on history and risk factors ?Starting at age 3 ?Pap Smear ?Ages 21-39 every 3 years ?Ages 61-65 every 5 years with  HPV testing ?More frequent testing may be required based on results and history ?Colon Cancer Screening ?Every 1-10 years based on test performed, risk factors, and history ?Starting at age 3 ?Bone Density Screening ?Every 2-10 years based on history ?Starting at age 21 for women ?Recommendations for men differ based on medication usage, history, and risk factors ?AAA Screening ?One time ultrasound ?Men 25-74 years old who have every smoked ?Lung Cancer Screening ?Low Dose Lung CT every 12 months ?Age 60-80 years with a 30 pack-year smoking history who still smoke or who have quit within the last 15 years ? ?Screening Labs ?Routine  Labs: Complete Blood Count (CBC), Complete Metabolic Panel (CMP), Cholesterol (Lipid Panel) ?Every 6-12 months based on history and medications ?May be recommended more frequently based on current conditions or previous results ?Hemoglobin  A1c Lab ?Every 3-12 months based on history and previous results ?Starting at age 13 or earlier with diagnosis of diabetes, high cholesterol, BMI >26, and/or risk factors ?Frequent monitoring for patients w

## 2021-10-30 ENCOUNTER — Telehealth (HOSPITAL_BASED_OUTPATIENT_CLINIC_OR_DEPARTMENT_OTHER): Payer: Self-pay

## 2021-10-30 ENCOUNTER — Telehealth (HOSPITAL_BASED_OUTPATIENT_CLINIC_OR_DEPARTMENT_OTHER): Payer: Self-pay | Admitting: Nurse Practitioner

## 2021-10-30 DIAGNOSIS — B372 Candidiasis of skin and nail: Secondary | ICD-10-CM

## 2021-10-30 NOTE — Telephone Encounter (Signed)
Pt is requesting a call back regarding medications by EOD today. Pt states that she left a vm regarding this as well. Informed pt that CMA is in clinic at the moment and will try her best to return her call. ?

## 2021-11-04 DIAGNOSIS — K219 Gastro-esophageal reflux disease without esophagitis: Secondary | ICD-10-CM | POA: Insufficient documentation

## 2021-11-04 DIAGNOSIS — E11620 Type 2 diabetes mellitus with diabetic dermatitis: Secondary | ICD-10-CM | POA: Insufficient documentation

## 2021-11-04 DIAGNOSIS — N915 Oligomenorrhea, unspecified: Secondary | ICD-10-CM | POA: Insufficient documentation

## 2021-11-04 DIAGNOSIS — B372 Candidiasis of skin and nail: Secondary | ICD-10-CM

## 2021-11-04 DIAGNOSIS — L309 Dermatitis, unspecified: Secondary | ICD-10-CM | POA: Insufficient documentation

## 2021-11-04 DIAGNOSIS — Z794 Long term (current) use of insulin: Secondary | ICD-10-CM | POA: Insufficient documentation

## 2021-11-04 DIAGNOSIS — L732 Hidradenitis suppurativa: Secondary | ICD-10-CM | POA: Insufficient documentation

## 2021-11-04 DIAGNOSIS — N912 Amenorrhea, unspecified: Secondary | ICD-10-CM | POA: Insufficient documentation

## 2021-11-04 HISTORY — DX: Candidiasis of skin and nail: B37.2

## 2021-11-04 NOTE — Assessment & Plan Note (Signed)
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 Given the location, suspect this is a manifestation of the condition. They are not draining at this time. She does have doxycycline.  ?Given location, recommend referral to GYN for further evaluation and I&D if necessary.  ?Recommend keeping skin dry and cool, avoid tight pants and underwear, avoid shaving as this can increase risks of infections. Warm compresses may help facilitate draining.  ? ?

## 2021-11-04 NOTE — Assessment & Plan Note (Signed)
Recent treatment with oral diflucan. Related to uncontrolled DM. Will send nystatin cream for management.  ?

## 2021-11-04 NOTE — Assessment & Plan Note (Signed)
Etiology unclear at this time. Consider possible PCOS in the setting of elevated BMI, DM, and abnormal menstrual patterns. Will send referral today for further evaluation and management.  ?

## 2021-11-04 NOTE — Assessment & Plan Note (Signed)
Chronic. In setting of uncontrolled DM may be a component of motility dysfunction present as well. No alarm sx present today.  ?Recommend continue with medication and work to improve control of BG levels to reduce risks of gastroparesis.  ?

## 2021-11-04 NOTE — Assessment & Plan Note (Signed)
Patient has a history of asthma.  Patient reports intermittent wheezing.  None noted on exam today.  Patient without any cough, congestion, fevers.  Low suspicion for bronchitis or pneumonia.  Patient to monitor closely.  She will follow-up if any symptoms change.  Patient I discussed options for asthma therapy.  Discussed option for adding inhaler.  Patient opted for Singulair.  If no improvement, will introduce low-dose ICS at next visit.  Patient to follow-up if any symptoms change or worsen.  No recent exacerbation, steroid or antibiotic therapy needed. ?

## 2021-11-04 NOTE — Assessment & Plan Note (Signed)
Morbid obesity with several co morbidities that are concerning to health and wellbeing.  ?Patient unable to do many exercises due to DDD and lack of pain control from this condition.  ?At this time, I recommend walking 10 minutes at a time and increasing distance and time each week with a goal of at least 150 minutes of physical activity per week to help manage weight and BG.  ?Goal carbohydrate intake less than 180 grams a day divided, goal saturated fat intake less than 13 grams a day divided. Increase fiber to 20-30 grams a day.  ?Weight management is going to be key in reduction of risks and control of BG levels.  ?Motivation is a factor at this time, which is understandable given her pain level.  ?Will work with patient to help develop a plan.  ?She may benefit from [facility] to control and weight loss assistance, but would like to wait for endocrinology evaluation before considering this.  ?

## 2021-11-04 NOTE — Assessment & Plan Note (Signed)
Chronic. Not well controlled.  ?Recent hospitalization at [facility] for DKA. Patient was started on humulin and novolog at that time- has not been taking humulin. Not taking metformin any longer.  ?She is experiencing significant manifestations of uncontrolled BG with frequent skin infections, recent UA shows proteinuria and heavy yeast.  ?Concerns include frustration with frequent finger sticks and insulin sticks. She is interested in CGM and insulin pump, which may be helpful for her management, however, I really feel based on her current uncontrolled level she needs endocrinology input for management and recommendations. Discussed with patient that we do not manage insulin pumps in this primary care office, but I would be happy to send her to endocrinology for further evaluation.  ?I do feel she may benefit from addition of Farxiga for added BG control and kidney protection. ?Suspect maceration of gluteal area due to frequent loose stools and probably yeast given the presence of yeast in urine and candidal skin infections elsewhere.  Recommend topical nystatin to areas, keep areas clean and dry.  ?Will await consultation with endocrine for further recommendations. For now, will continue novolog at 15u TID.  ? ? ?

## 2021-11-05 NOTE — Telephone Encounter (Signed)
Provider's CMA spoke with patient. Resolution reached. ?

## 2021-11-21 ENCOUNTER — Ambulatory Visit (HOSPITAL_BASED_OUTPATIENT_CLINIC_OR_DEPARTMENT_OTHER): Payer: 59 | Admitting: Nurse Practitioner

## 2021-11-28 ENCOUNTER — Other Ambulatory Visit (HOSPITAL_BASED_OUTPATIENT_CLINIC_OR_DEPARTMENT_OTHER): Payer: Self-pay | Admitting: Nurse Practitioner

## 2021-11-28 DIAGNOSIS — B372 Candidiasis of skin and nail: Secondary | ICD-10-CM

## 2021-11-29 MED ORDER — VALACYCLOVIR HCL 1 G PO TABS: 1000.0000 mg | ORAL_TABLET | Freq: Every day | ORAL | 6 refills | Status: DC

## 2021-11-29 MED ORDER — FLUCONAZOLE 150 MG PO TABS: 150.0000 mg | ORAL_TABLET | Freq: Once | ORAL | 2 refills | Status: AC

## 2021-11-29 MED ORDER — NYSTATIN 100000 UNIT/GM EX OINT: 1.0000 "application " | TOPICAL_OINTMENT | Freq: Two times a day (BID) | CUTANEOUS | 6 refills | Status: DC

## 2021-12-03 ENCOUNTER — Encounter (HOSPITAL_BASED_OUTPATIENT_CLINIC_OR_DEPARTMENT_OTHER): Payer: Self-pay | Admitting: Nurse Practitioner

## 2021-12-03 ENCOUNTER — Ambulatory Visit (INDEPENDENT_AMBULATORY_CARE_PROVIDER_SITE_OTHER): Payer: 59 | Admitting: Nurse Practitioner

## 2021-12-03 VITALS — BP 103/83 | HR 103 | Ht 68.0 in | Wt 362.4 lb

## 2021-12-03 DIAGNOSIS — B372 Candidiasis of skin and nail: Secondary | ICD-10-CM

## 2021-12-03 DIAGNOSIS — Z6841 Body Mass Index (BMI) 40.0 and over, adult: Secondary | ICD-10-CM

## 2021-12-03 DIAGNOSIS — E11620 Type 2 diabetes mellitus with diabetic dermatitis: Secondary | ICD-10-CM | POA: Diagnosis not present

## 2021-12-03 DIAGNOSIS — N731 Chronic parametritis and pelvic cellulitis: Secondary | ICD-10-CM

## 2021-12-03 DIAGNOSIS — L732 Hidradenitis suppurativa: Secondary | ICD-10-CM

## 2021-12-03 DIAGNOSIS — Z794 Long term (current) use of insulin: Secondary | ICD-10-CM

## 2021-12-03 DIAGNOSIS — E66813 Obesity, class 3: Secondary | ICD-10-CM

## 2021-12-03 DIAGNOSIS — N39490 Overflow incontinence: Secondary | ICD-10-CM | POA: Diagnosis not present

## 2021-12-03 MED ORDER — DEXCOM G7 SENSOR MISC: 11 refills | Status: DC

## 2021-12-03 MED ORDER — DEXCOM G7 RECEIVER DEVI: 0 refills | Status: DC

## 2021-12-03 MED ORDER — KETOROLAC TROMETHAMINE 60 MG/2ML IM SOLN
60.0000 mg | Freq: Once | INTRAMUSCULAR | Status: AC
  Administered 2021-12-03: 60 mg via INTRAMUSCULAR

## 2021-12-03 MED ORDER — FLUCONAZOLE 150 MG PO TABS: ORAL_TABLET | ORAL | 2 refills | Status: DC

## 2021-12-03 MED ORDER — DOXYCYCLINE HYCLATE 100 MG PO TABS: 100.0000 mg | ORAL_TABLET | Freq: Two times a day (BID) | ORAL | 0 refills | Status: DC

## 2021-12-03 NOTE — Patient Instructions (Addendum)
I have sent the referral for Gynecology and Diabetes Education.  I am also going to try a referral for Endocrinology.    Once we get your labs back I will be in touch with you to discuss changing your medication- if I need to.   I want you to continue with your insulin three times a day at  units.  I have sent in a dexcom order for you .

## 2021-12-03 NOTE — Progress Notes (Signed)
  [name], DNP, AGNP-c [facility] [address] [place], [place] [address] [phone] Office [phone] Fax  ESTABLISHED PATIENT- Chronic Health and/or Follow-Up Visit  Blood pressure 103/83, pulse (!) 103, height 5\' 8"  (1.727 m), weight (!) 362 lb 6.4 oz (164.4 kg), SpO2 99 %.  Follow-up (Pt her for f/u on diabetes )   HPI  Monica Hunter  is a 41 y.o. year old female presenting today for evaluation and management of the following: DM She has lost 9 pounds Not checking blood sugars- she reports that she cannot prick her fingers as she has to use her hands Taking novolog three times a day- she tells me that she tries to take this three times a day, but sometimes it is less than this. She is using 15u and not checking her sugar to determine if the dose is needed.  Cannot get Novolin due to cost Cannot take glipizide- allergic to sulfa Cannot take metformin due to severe diarrhea Tried Ozempic- caused nausea and vomiting She is open to the CGM. She tells me she does not like taking medication Abscess Chronic on the labia Chronic in the rectal area Chronic on   ROS All ROS negative with exception of what is listed in HPI  PHYSICAL EXAM Physical Exam  ASSESSMENT & PLAN Problem List Items Addressed This Visit   None    FOLLOW-UP No follow-ups on file.  Time: ***minutes, >50% spent counseling, care coordination, chart review, and documentation.   24, DNP, AGNP-c

## 2021-12-04 ENCOUNTER — Telehealth (HOSPITAL_BASED_OUTPATIENT_CLINIC_OR_DEPARTMENT_OTHER): Payer: Self-pay | Admitting: Nurse Practitioner

## 2021-12-04 LAB — COMPREHENSIVE METABOLIC PANEL
ALT: 22 IU/L (ref 0–32)
AST: 18 IU/L (ref 0–40)
Albumin/Globulin Ratio: 1.2 (ref 1.2–2.2)
Albumin: 4 g/dL (ref 3.8–4.8)
Alkaline Phosphatase: 143 IU/L — ABNORMAL HIGH (ref 44–121)
BUN/Creatinine Ratio: 11 (ref 9–23)
BUN: 7 mg/dL (ref 6–20)
Bilirubin Total: 0.3 mg/dL (ref 0.0–1.2)
CO2: 23 mmol/L (ref 20–29)
Calcium: 9.6 mg/dL (ref 8.7–10.2)
Chloride: 98 mmol/L (ref 96–106)
Creatinine, Ser: 0.64 mg/dL (ref 0.57–1.00)
Globulin, Total: 3.4 g/dL (ref 1.5–4.5)
Glucose: 528 mg/dL (ref 70–99)
Potassium: 3.8 mmol/L (ref 3.5–5.2)
Sodium: 136 mmol/L (ref 134–144)
Total Protein: 7.4 g/dL (ref 6.0–8.5)
eGFR: 115 mL/min/{1.73_m2} (ref >59)

## 2021-12-04 LAB — HEMOGLOBIN A1C
Est. average glucose Bld gHb Est-mCnc: 361 mg/dL
Hgb A1c MFr Bld: 14.2 % — ABNORMAL HIGH (ref 4.8–5.6)

## 2021-12-04 LAB — CBC WITH DIFFERENTIAL/PLATELET
Basophils Absolute: 0 10*3/uL (ref 0.0–0.2)
Basos: 0 %
EOS (ABSOLUTE): 0.1 10*3/uL (ref 0.0–0.4)
Eos: 1 %
Hematocrit: 43.1 % (ref 34.0–46.6)
Hemoglobin: 14.3 g/dL (ref 11.1–15.9)
Immature Grans (Abs): 0 10*3/uL (ref 0.0–0.1)
Immature Granulocytes: 0 %
Lymphocytes Absolute: 2.5 10*3/uL (ref 0.7–3.1)
Lymphs: 37 %
MCH: 30.2 pg (ref 26.6–33.0)
MCHC: 33.2 g/dL (ref 31.5–35.7)
MCV: 91 fL (ref 79–97)
Monocytes Absolute: 0.5 10*3/uL (ref 0.1–0.9)
Monocytes: 8 %
Neutrophils Absolute: 3.7 10*3/uL (ref 1.4–7.0)
Neutrophils: 54 %
Platelets: 280 10*3/uL (ref 150–450)
RBC: 4.73 x10E6/uL (ref 3.77–5.28)
RDW: 13.3 % (ref 11.7–15.4)
WBC: 6.8 10*3/uL (ref 3.4–10.8)

## 2021-12-04 NOTE — Assessment & Plan Note (Signed)
Urinary incontinence in the setting of suspected elevated blood glucose levels.  It is unclear if the elevation in blood sugar levels are causing frequent urination that is contributing to this.  Discussed with patient that our first step should be to adequately control her blood sugars and her frequency of urination should decrease and then we can determine if the incontinence is from another source.  We will continue to work with patient to help educate on importance of blood sugar control.

## 2021-12-04 NOTE — Telephone Encounter (Signed)
Received a fax on 6/6 @ 2:47 regarding a critical lab result. Collected 12/03/21 1456 Glucose: 528 Ref: 70-99.  Please advise.

## 2021-12-04 NOTE — Assessment & Plan Note (Signed)
Chronic. Not well controlled.  Significant concern for risks associated with uncontrolled blood glucose and co-morbidities.  Patient does not appear to have a clear understanding of the severity of her disease. She is able to verbalize the risks that come with uncontrolled BG, however, she is not willing to make the changes necessary to appropriately manage her condition, such as monitoring her BG routinely.  There was an endocrinology referral placed at last visit, but she has yet to hear from them. I do feel at this time that management of her condition is beyond the ability of primary care and specialist care is needed to help with control and management for patient protection. Will resend referral today for endocrinology. Will also send referral for diabetic education.  Patient is in need of a great deal of management to help get her disease state under control.  Risks of uncontrolled blood sugar and low blood sugars discussed with patient with use of short acting insulin without monitoring blood sugar levels. I will send order for Dexcom today however I am not sure if this will be covered by her insurance. Discussed the option of additional medication for chronic management however patient is resistant to the idea of adding oral therapies at this time. We will obtain labs today to monitor her A1c, kidney function, liver function. Expressed to patient the importance of management of her diabetes to prevent severe consequences.  She expressed understanding of consequences but reiterated her desire not to start additional medication or monitor blood sugar with fingersticks.

## 2021-12-04 NOTE — Assessment & Plan Note (Addendum)
Chronic.  Discussed with patient the etiology behind this is uncontrolled blood sugars.  Will resend long-term Diflucan treatment to try to get better control of this however expressed to patient that this will not go away as long as her blood sugars remain elevated.

## 2021-12-04 NOTE — Assessment & Plan Note (Signed)
Chronic.  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  Discussed recommendation with patient to restart doxycycline to see if we can get these a little better under control.  Given the chronic nature of this condition these will likely not resolve permanently.  Given suspected elevation in blood sugars avoidance of I&D at this time is recommended due to increased risk of infection and slow wound healing.  Encourage patient to continue to use warm compresses to help facilitate drainage and keep the area dry and is cool as possible.  We can send to dermatology for further evaluation and recommendations if patient wishes.

## 2021-12-04 NOTE — Assessment & Plan Note (Signed)
Current BMI 55.10.  Diet and exercise counseled today.  I do feel the patient could benefit from GLP-1 medication for improved control of her blood sugars as well as weight management.  Unfortunately her dietary habits cause nausea and vomiting in the past with this medication and patient does not wish to avoid these food triggers.  Significant education is needed.

## 2021-12-05 ENCOUNTER — Encounter (HOSPITAL_BASED_OUTPATIENT_CLINIC_OR_DEPARTMENT_OTHER): Payer: Self-pay | Admitting: Nurse Practitioner

## 2021-12-06 ENCOUNTER — Other Ambulatory Visit (HOSPITAL_BASED_OUTPATIENT_CLINIC_OR_DEPARTMENT_OTHER): Payer: Self-pay

## 2021-12-06 DIAGNOSIS — E11620 Type 2 diabetes mellitus with diabetic dermatitis: Secondary | ICD-10-CM

## 2021-12-06 MED ORDER — RYBELSUS 3 MG PO TABS: 3.0000 mg | ORAL_TABLET | Freq: Every day | ORAL | 0 refills | Status: DC

## 2021-12-06 NOTE — Telephone Encounter (Signed)
Ribosome is the medication that she is wanting Please advise.

## 2021-12-11 ENCOUNTER — Ambulatory Visit (INDEPENDENT_AMBULATORY_CARE_PROVIDER_SITE_OTHER): Payer: 59 | Admitting: Nurse Practitioner

## 2021-12-11 ENCOUNTER — Encounter (HOSPITAL_BASED_OUTPATIENT_CLINIC_OR_DEPARTMENT_OTHER): Payer: Self-pay | Admitting: Nurse Practitioner

## 2021-12-11 DIAGNOSIS — J45909 Unspecified asthma, uncomplicated: Secondary | ICD-10-CM

## 2021-12-11 DIAGNOSIS — Z6841 Body Mass Index (BMI) 40.0 and over, adult: Secondary | ICD-10-CM

## 2021-12-11 DIAGNOSIS — E11620 Type 2 diabetes mellitus with diabetic dermatitis: Secondary | ICD-10-CM

## 2021-12-11 DIAGNOSIS — Z794 Long term (current) use of insulin: Secondary | ICD-10-CM

## 2021-12-11 DIAGNOSIS — K219 Gastro-esophageal reflux disease without esophagitis: Secondary | ICD-10-CM

## 2021-12-11 MED ORDER — DEXLANSOPRAZOLE 60 MG PO CPDR: 60.0000 mg | DELAYED_RELEASE_CAPSULE | Freq: Every day | ORAL | 11 refills | Status: DC

## 2021-12-11 MED ORDER — RYBELSUS 7 MG PO TABS: 7.0000 mg | ORAL_TABLET | Freq: Every day | ORAL | 1 refills | Status: DC

## 2021-12-11 MED ORDER — ALBUTEROL SULFATE HFA 108 (90 BASE) MCG/ACT IN AERS: 1.0000 | INHALATION_SPRAY | RESPIRATORY_TRACT | 6 refills | Status: DC | PRN

## 2021-12-11 NOTE — Patient Instructions (Addendum)
Continue with your insulin with each meal .    Start with 20 units as your baseline.  If your blood sugar is more than 170 before a meal, I want you to increase by 4 units (total of 24) If your blood sugar is more than 170 before EACH meal for 3 days in a row, then your new baseline is 24 units.   Repeat this pattern every 3 days until your pre-meal blood sugars are 100-150.  When your sugars start to come down, decrease the insulin injected by 4 units if your blood sugar is less than 100 before a meal. If you have been giving insulin and NOT eating or only eating small amounts, be very careful to monitor your blood sugar closely after giving insulin once your blood sugars are in better control.  It is best not to skip meals to prevent your sugars from dropping .

## 2021-12-11 NOTE — Progress Notes (Signed)
Virtual Visit Encounter telephone visit.   I connected with  Monica Hunter on 12/26/21 at 10:50 AM EDT by secure audio telemedicine application. I verified that I am speaking with the correct person using two identifiers.   I introduced myself as a Designer, jewellery with the practice. The limitations of evaluation and management by telemedicine discussed with the patient and the availability of in person appointments. The patient expressed verbal understanding and consent to proceed.  Participating parties in this visit include: Myself and patient  The patient is: Patient Location: Home I am: Provider Location: Office/Clinic Subjective:    CC and HPI: Monica Hunter is a 41 y.o. year old female presenting for follow up of DM.   Patient reports the following: Initially her blood sugar would not come down below 400 when she started using the dexcom She reports the Dexcom will not give a number above 400- so it just read "high" She reports her numbers were below 400 for the first time yesterday She tells me that she has been working from 4pm to midnight and has not been eating much.  She has started on the Rybelsus and is tolerating the medication well.  She is not having any side effects at this time other than noticeable decreased appetite. She has been using the novolog three times a day from 15-20 units with each injection.  She is not sure how she should be managing her insulin if she is not eating She reports that her most recent blood sugar was 296. She tells me that she is already feeling better.  At her last visit she had several abscessed areas.  She tells me that these are improving and are no longer painful and draining.   Past medical history, Surgical history, Family history not pertinant except as noted below, Social history, Allergies, and medications have been entered into the medical record, reviewed, and corrections made.   Review of Systems:  All review of  systems negative except what is listed in the HPI  Objective:    Alert and oriented x 4 Speaking in clear sentences with no shortness of breath. No distress.  Impression and Recommendations:    Problem List Items Addressed This Visit     Asthma    Refill on albuterol.  No alarm symptoms present at this time.      Relevant Medications   albuterol (VENTOLIN HFA) 108 (90 Base) MCG/ACT inhaler   Type 2 diabetes mellitus with diabetic dermatitis, with long-term current use of insulin ([place]) - Primary    We have already seen a significant improvement in the patient's blood sugar levels after starting the CGM.  It is clear that awareness of the blood sugar levels are contributing to the improvement of her blood sugar control.  At this time I encouraged the patient to continue taking the Rybelsus as directed.  We also discussed the importance of eating at least 3 small meals during the day with both protein and carbohydrates to best keep her sugar levels under control.  I do recommend that she continue with the insulin injections but we did discuss the option of titrating them down based on the blood sugars if they start to reach levels that are below 200.  We will plan to titrate up the Rybelsus at the end of the initial 30 days.  Encouraged the patient to reach out with any questions or concerns.  We will need to obtain foot exam and urine microalbumin at her next appointment.  Relevant Medications   Semaglutide (RYBELSUS) 7 MG TABS   Gastroesophageal reflux disease    Refill on Dexilant provided.  Encourage patient to continue to use this medication as directed and be sure that she is eating small meals throughout the day to help prevent upset stomach or reflux symptoms with the Rybelsus.      Relevant Medications   dexlansoprazole (DEXILANT) 60 MG capsule   Class 3 severe obesity due to excess calories without serious comorbidity with body mass index (BMI) of 50.0 to 59.9 in adult [facility])    Relevant Medications   Semaglutide (RYBELSUS) 7 MG TABS    orders and follow up as documented in EMR I discussed the assessment and treatment plan with the patient. The patient was provided an opportunity to ask questions and all were answered. The patient agreed with the plan and demonstrated an understanding of the instructions.   The patient was advised to call back or seek an in-person evaluation if the symptoms worsen or if the condition fails to improve as anticipated.  Follow-Up: in 3 months  I provided 34 minutes of non-face-to-face interaction with this non face-to-face encounter including intake, same-day documentation, and chart review.   Orma Render, NP , DNP, AGNP-c [place] [facility] [phone] (fax)

## 2021-12-26 NOTE — Assessment & Plan Note (Signed)
We have already seen a significant improvement in the patient's blood sugar levels after starting the CGM.  It is clear that awareness of the blood sugar levels are contributing to the improvement of her blood sugar control.  At this time I encouraged the patient to continue taking the Rybelsus as directed.  We also discussed the importance of eating at least 3 small meals during the day with both protein and carbohydrates to best keep her sugar levels under control.  I do recommend that she continue with the insulin injections but we did discuss the option of titrating them down based on the blood sugars if they start to reach levels that are below 200.  We will plan to titrate up the Rybelsus at the end of the initial 30 days.  Encouraged the patient to reach out with any questions or concerns.  We will need to obtain foot exam and urine microalbumin at her next appointment.

## 2021-12-26 NOTE — Assessment & Plan Note (Signed)
Refill on albuterol.  No alarm symptoms present at this time.

## 2021-12-26 NOTE — Assessment & Plan Note (Signed)
Refill on Dexilant provided.  Encourage patient to continue to use this medication as directed and be sure that she is eating small meals throughout the day to help prevent upset stomach or reflux symptoms with the Rybelsus.

## 2022-01-02 ENCOUNTER — Telehealth (HOSPITAL_BASED_OUTPATIENT_CLINIC_OR_DEPARTMENT_OTHER): Payer: Self-pay

## 2022-01-02 NOTE — Telephone Encounter (Signed)
Magda Paganini called from [facility] [phone] that patient called requesting a omipod insulin pump, I spoke with Minna Merritts and she didn't order this as she has the patient on insulin and referral to endocrinology. I relayed this information to Magda Paganini that this was not approved from [place] as she doesn't prescribe insulin pumps nor manage them.

## 2022-01-07 ENCOUNTER — Ambulatory Visit: Payer: 59 | Admitting: Nutrition

## 2022-02-06 ENCOUNTER — Telehealth (HOSPITAL_BASED_OUTPATIENT_CLINIC_OR_DEPARTMENT_OTHER): Payer: Self-pay | Admitting: Nurse Practitioner

## 2022-02-06 NOTE — Telephone Encounter (Signed)
Received fax from [employer] for Dexcom G7 Sensor. Key: [id number] Please advise.

## 2022-02-11 ENCOUNTER — Encounter (HOSPITAL_BASED_OUTPATIENT_CLINIC_OR_DEPARTMENT_OTHER): Payer: Self-pay | Admitting: Nurse Practitioner

## 2022-02-11 ENCOUNTER — Ambulatory Visit (INDEPENDENT_AMBULATORY_CARE_PROVIDER_SITE_OTHER): Payer: Commercial Managed Care - HMO | Admitting: Nurse Practitioner

## 2022-02-11 ENCOUNTER — Ambulatory Visit (HOSPITAL_BASED_OUTPATIENT_CLINIC_OR_DEPARTMENT_OTHER): Payer: 59

## 2022-02-11 VITALS — BP 144/95 | HR 83 | Ht 68.0 in | Wt 363.0 lb

## 2022-02-11 DIAGNOSIS — M545 Low back pain, unspecified: Secondary | ICD-10-CM | POA: Diagnosis not present

## 2022-02-11 DIAGNOSIS — L732 Hidradenitis suppurativa: Secondary | ICD-10-CM | POA: Diagnosis not present

## 2022-02-11 DIAGNOSIS — E11620 Type 2 diabetes mellitus with diabetic dermatitis: Secondary | ICD-10-CM

## 2022-02-11 DIAGNOSIS — Z794 Long term (current) use of insulin: Secondary | ICD-10-CM

## 2022-02-11 DIAGNOSIS — Z6841 Body Mass Index (BMI) 40.0 and over, adult: Secondary | ICD-10-CM

## 2022-02-11 DIAGNOSIS — G8929 Other chronic pain: Secondary | ICD-10-CM

## 2022-02-11 MED ORDER — INSULIN GLARGINE 100 UNITS/ML SOLOSTAR PEN: 20.0000 [IU] | PEN_INJECTOR | Freq: Two times a day (BID) | SUBCUTANEOUS | 11 refills | Status: DC

## 2022-02-11 MED ORDER — INSULIN PEN NEEDLE 31G X 6 MM MISC: 1.0000 | Freq: Three times a day (TID) | 11 refills | Status: DC

## 2022-02-11 MED ORDER — DEXAMETHASONE SODIUM PHOSPHATE 10 MG/ML IJ SOLN
10.0000 mg | Freq: Once | INTRAMUSCULAR | Status: AC
  Administered 2022-02-11: 10 mg via INTRAMUSCULAR

## 2022-02-11 MED ORDER — TRULICITY 1.5 MG/0.5ML ~~LOC~~ SOAJ: 1.5000 mg | SUBCUTANEOUS | 0 refills | Status: DC

## 2022-02-11 MED ORDER — DEXCOM G7 SENSOR MISC: 11 refills | Status: DC

## 2022-02-11 MED ORDER — TRULICITY 0.75 MG/0.5ML ~~LOC~~ SOAJ: 0.7500 mg | SUBCUTANEOUS | 0 refills | Status: DC

## 2022-02-11 MED ORDER — CLINDAMYCIN HCL 300 MG PO CAPS: 300.0000 mg | ORAL_CAPSULE | Freq: Three times a day (TID) | ORAL | 0 refills | Status: DC

## 2022-02-11 MED ORDER — INSULIN ASPART FLEXPEN 100 UNIT/ML ~~LOC~~ SOPN: 20.0000 [IU] | PEN_INJECTOR | Freq: Three times a day (TID) | SUBCUTANEOUS | 3 refills | Status: DC

## 2022-02-11 NOTE — Progress Notes (Signed)
[name], DNP, AGNP-c [facility] [address] [place], [place] [address] [phone] Office [phone] Fax  ESTABLISHED PATIENT- Chronic Health and/or Follow-Up Visit  Blood pressure (!) 144/95, pulse 83, height 5\' 8"  (1.727 m), weight (!) 363 lb (164.7 kg), SpO2 99 %.  Follow-up (Patient presents today for follow up DM, she changed insurance and her diabetic meds are not covered. insurance). She has been in the hospital since last visit)   HPI  Monica Hunter  is a 41 y.o. year old female presenting today for evaluation and management of the following: Diabetes Her medications are no longer covered by her insurance- she has not been able to get her novolog, rybelsus, and dexcom and is out of these medications.  She went back into the hospital since her last visit in DKA (around July 17). She tells me that they did put her on lantus and she has one more pen available.   She is currently taking 20 units of lantus twice a day She is also supposed to be using 20 of Novolog with each meal, which she has not taken for several weeks.  She is not sure what her blood sugars are running She denies increased hunger. She has noticed increased thirst and urination.  She does not have any non-healing wounds.  Hidradenitis She tells me that she has had an increase in abscessed areas in her groin recently. These are chronic in nature and have been draining consistently She reports these areas are painful and embarrassing in nature Bilateral back pain She tells me she is having bilateral low back pain  ROS All ROS negative with exception of what is listed in HPI  PHYSICAL EXAM Physical Exam Vitals and nursing note reviewed.  Constitutional:      General: She is not in acute distress.    Appearance: Normal appearance. She is obese.  HENT:     Head: Normocephalic.  Eyes:     Extraocular Movements: Extraocular movements  intact.     Conjunctiva/sclera: Conjunctivae normal.     Pupils: Pupils are equal, round, and reactive to light.  Neck:     Vascular: No carotid bruit.  Cardiovascular:     Rate and Rhythm: Normal rate and regular rhythm.     Pulses: Normal pulses.     Heart sounds: Normal heart sounds. No murmur heard. Pulmonary:     Effort: Pulmonary effort is normal.     Breath sounds: Normal breath sounds. No wheezing.  Abdominal:     General: Bowel sounds are normal. There is no distension.     Palpations: Abdomen is soft.     Tenderness: There is no abdominal tenderness. There is no guarding.  Musculoskeletal:        General: Normal range of motion.     Cervical back: Normal range of motion and neck supple.  Lymphadenopathy:     Cervical: No cervical adenopathy.  Skin:    General: Skin is warm and dry.     Capillary Refill: Capillary refill takes less than 2 seconds.  Neurological:     General: No focal deficit present.     Mental Status: She is alert and oriented to person, place, and time.  Psychiatric:        Mood and Affect: Mood normal.        Behavior: Behavior normal.        Thought Content: Thought content normal.  Judgment: Judgment normal.     ASSESSMENT & PLAN Problem List Items Addressed This Visit     Class 3 severe obesity due to excess calories without serious comorbidity with body mass index (BMI) of 50.0 to 59.9 in adult [facility])    Chronic. Recommend daily physical activity with walking at least 10 minutes and slowly increasing time and distance. Recommend dietary changes to include low carbohydrate and low saturated fat options high in fresh vegetables and lean meats. Unfortunately GLP-1 medication not covered by her insurance. Recommend working diligently on diet and exercise for overall health and risk reduction of CV disease. Will plan to follow-up in 3 months or sooner if needed.       Relevant Medications   Insulin Aspart FlexPen (NOVOLOG) 100 UNIT/ML    Insulin Glargine (BASAGLAR KWIKPEN) 100 UNIT/ML   Other Relevant Orders   Comprehensive metabolic panel (Completed)   CBC With Diff/Platelet (Completed)   Hemoglobin A1c (Completed)   Hidradenitis suppurativa    Chronic draining lesions to axilla and groin with tracking consistent with hidradenitis. Given the increased drainage and irritation, we will send treatment today with antibiotics to try to get improved control. Recommend follow-up if symptoms worsen or fail to improve. May need to consider I&D to help reduce discomfort.       Relevant Medications   clindamycin (CLEOCIN) 300 MG capsule   Type 2 diabetes mellitus with diabetic dermatitis, with long-term current use of insulin ([facility]) - Primary    Chronic diabetes with recent DKA hospitalization due to loss of coverage for medications by insurance. At this time she onlyt taking her insulin glargine and only has one pen left, which was started during hospitalization. She should be on 20 units of insulin glargin 20 units twice a day and Novolog (insulin aspart) 20 units three times a day with meals. She was previously on Rybelsus and using the Dexcom for management, but these are no longer covered. We will obtain labs today for monitoring, I expect that her A1c will be elevated and likely her BG will not be controlled. Unfortunately, we do not have samples to supply her with. It does appear that Mariella Saa is covered by her insurance so I will send that in and go ahead and refill the Novolog and Dexcom to see if we can get coverage for that by her insurance. I recommend looking into patient assistance with the insulin manufacturers to see if she is eligible for discounted rates given the high cost of these medications. We will plan to restart the medications at the previous doses and monitor closely. Follow-up in 3 months or sooner if needed.       Relevant Medications   Insulin Aspart FlexPen (NOVOLOG) 100 UNIT/ML   Insulin Pen Needle 31G X 6 MM  MISC   Insulin Glargine (BASAGLAR KWIKPEN) 100 UNIT/ML   Other Relevant Orders   Comprehensive metabolic panel (Completed)   CBC With Diff/Platelet (Completed)   Hemoglobin A1c (Completed)   Chronic bilateral low back pain without sciatica    Chronic in nature. Recommend gentle stretching and daily walking to help with pain. Ibuprofen may also be helpful. Avoid lifting and moving heavy items- when lifting use leg muscles.         FOLLOW-UP Return in about 3 months (around 05/14/2022) for Diabetes.  [name], DNP, AGNP-c 02/11/2022 10:14 AM

## 2022-02-11 NOTE — Patient Instructions (Addendum)
I have sent the orders for:  Novolog 20 units three times a day with meals.  Lantus 20 units twice a day  Dexcom G7 sensors  Trulicity (once a week) injection   I have also send in the clindamycin for the boils.    Your blood sugar reading goals:  When you wake up: Less than 120  2 hours after meals: Less than 180   I want you to look for plantar wart treatment at the pharmacy and try this- they usually have liquid that you can put on the wart or a pad with medicine on it. You will probably need to do the treatment for a few months for it to completely go away.

## 2022-02-11 NOTE — Telephone Encounter (Signed)
Recived another PA for [employer] G7 Sensor Key: [employer]

## 2022-02-12 ENCOUNTER — Ambulatory Visit (HOSPITAL_BASED_OUTPATIENT_CLINIC_OR_DEPARTMENT_OTHER): Payer: 59

## 2022-02-13 LAB — COMPREHENSIVE METABOLIC PANEL
ALT: 13 IU/L (ref 0–32)
AST: 12 IU/L (ref 0–40)
Albumin/Globulin Ratio: 1.3 (ref 1.2–2.2)
Albumin: 4 g/dL (ref 3.9–4.9)
Alkaline Phosphatase: 107 IU/L (ref 44–121)
BUN/Creatinine Ratio: 18 (ref 9–23)
BUN: 10 mg/dL (ref 6–20)
Bilirubin Total: 0.3 mg/dL (ref 0.0–1.2)
CO2: 22 mmol/L (ref 20–29)
Calcium: 9.6 mg/dL (ref 8.7–10.2)
Chloride: 104 mmol/L (ref 96–106)
Creatinine, Ser: 0.57 mg/dL (ref 0.57–1.00)
Globulin, Total: 3.2 g/dL (ref 1.5–4.5)
Glucose: 196 mg/dL — ABNORMAL HIGH (ref 70–99)
Potassium: 4.1 mmol/L (ref 3.5–5.2)
Sodium: 144 mmol/L (ref 134–144)
Total Protein: 7.2 g/dL (ref 6.0–8.5)
eGFR: 118 mL/min/{1.73_m2} (ref >59)

## 2022-02-13 LAB — CBC WITH DIFF/PLATELET
Basophils Absolute: 0 10*3/uL (ref 0.0–0.2)
Basos: 0 %
EOS (ABSOLUTE): 0 10*3/uL (ref 0.0–0.4)
Eos: 0 %
Hematocrit: 41.2 % (ref 34.0–46.6)
Hemoglobin: 13.2 g/dL (ref 11.1–15.9)
Immature Grans (Abs): 0 10*3/uL (ref 0.0–0.1)
Immature Granulocytes: 0 %
Lymphocytes Absolute: 2.7 10*3/uL (ref 0.7–3.1)
Lymphs: 23 %
MCH: 29.6 pg (ref 26.6–33.0)
MCHC: 32 g/dL (ref 31.5–35.7)
MCV: 92 fL (ref 79–97)
Monocytes Absolute: 0.8 10*3/uL (ref 0.1–0.9)
Monocytes: 7 %
Neutrophils Absolute: 8.3 10*3/uL — ABNORMAL HIGH (ref 1.4–7.0)
Neutrophils: 70 %
Platelets: 287 10*3/uL (ref 150–450)
RBC: 4.46 x10E6/uL (ref 3.77–5.28)
RDW: 13.7 % (ref 11.7–15.4)
WBC: 11.9 10*3/uL — ABNORMAL HIGH (ref 3.4–10.8)

## 2022-02-13 LAB — HEMOGLOBIN A1C
Est. average glucose Bld gHb Est-mCnc: 303 mg/dL
Hgb A1c MFr Bld: 12.2 % — ABNORMAL HIGH (ref 4.8–5.6)

## 2022-03-05 ENCOUNTER — Telehealth (HOSPITAL_BASED_OUTPATIENT_CLINIC_OR_DEPARTMENT_OTHER): Payer: Self-pay | Admitting: Nurse Practitioner

## 2022-03-05 ENCOUNTER — Ambulatory Visit (HOSPITAL_BASED_OUTPATIENT_CLINIC_OR_DEPARTMENT_OTHER): Payer: 59 | Admitting: Nurse Practitioner

## 2022-03-05 ENCOUNTER — Encounter (HOSPITAL_BASED_OUTPATIENT_CLINIC_OR_DEPARTMENT_OTHER): Payer: Self-pay | Admitting: Advanced Practice Midwife

## 2022-03-05 ENCOUNTER — Other Ambulatory Visit (HOSPITAL_COMMUNITY)
Admission: RE | Admit: 2022-03-05 | Discharge: 2022-03-05 | Disposition: A | Discharge status: Home / Self Care | Payer: Commercial Managed Care - HMO | Source: Ambulatory Visit | Attending: Advanced Practice Midwife | Admitting: Advanced Practice Midwife

## 2022-03-05 ENCOUNTER — Ambulatory Visit (INDEPENDENT_AMBULATORY_CARE_PROVIDER_SITE_OTHER): Payer: Commercial Managed Care - HMO | Admitting: Advanced Practice Midwife

## 2022-03-05 VITALS — BP 137/95 | HR 83 | Ht 68.0 in | Wt 360.4 lb

## 2022-03-05 DIAGNOSIS — N83201 Unspecified ovarian cyst, right side: Secondary | ICD-10-CM | POA: Diagnosis not present

## 2022-03-05 DIAGNOSIS — N946 Dysmenorrhea, unspecified: Secondary | ICD-10-CM | POA: Diagnosis not present

## 2022-03-05 DIAGNOSIS — R630 Anorexia: Secondary | ICD-10-CM

## 2022-03-05 DIAGNOSIS — N911 Secondary amenorrhea: Secondary | ICD-10-CM

## 2022-03-05 DIAGNOSIS — Z124 Encounter for screening for malignant neoplasm of cervix: Secondary | ICD-10-CM | POA: Insufficient documentation

## 2022-03-05 DIAGNOSIS — Z01419 Encounter for gynecological examination (general) (routine) without abnormal findings: Secondary | ICD-10-CM

## 2022-03-05 DIAGNOSIS — N83202 Unspecified ovarian cyst, left side: Secondary | ICD-10-CM

## 2022-03-05 NOTE — Progress Notes (Signed)
Subjective:     Makaria Wirkkala is a 41 y.o. female here at [facility] for a routine exam.  Current complaints: no menses without progesterone challenge, severe pain with menses so pt doesn't want to cause periods very often.  She desires pregnancy at some point, but wants to bring down her A1C and lose weight, which she is working on.  She reports very little appetite and she goes hours without food. She is Type 2 diabetic and taking Trulicity. She has a new PCP who is managing this and a referral to endocrinology.  She reports frequent yeast infections but no symptoms now, and has refills of Diflucan from her PCP if she develops symptoms.  Personal health questionnaire reviewed: yes.  Do you have a primary care provider? yes Do you feel safe at home? yes  Flowsheet Row Office Visit from 03/05/2022 in [facility]  PHQ-2 Total Score 0       Health Maintenance Due  Topic Date Due   FOOT EXAM  Never done   OPHTHALMOLOGY EXAM  Never done   Hepatitis C Screening  Never done   TETANUS/TDAP  Never done   PAP SMEAR-Modifier  Never done   COVID-19 Vaccine (3 - Pfizer series) 07/13/2020   INFLUENZA VACCINE  Never done     Risk factors for chronic health problems: Smoking: 1/2 pack per day of cigarettes Alchohol/how much: None Pt BMI: Body mass index is 54.8 kg/m.   Gynecologic History No LMP recorded. Patient is perimenopausal. Contraception: none Last Pap: unknown. Results were: normal per pt Last mammogram: n/a.   Obstetric History OB History  Gravida Para Term Preterm AB Living  0 0 0 0 0 0  SAB IAB Ectopic Multiple Live Births  0 0 0 0 0     The following portions of the patient's history were reviewed and updated as appropriate: allergies, current medications, past family history, past medical history, past social history, past surgical history, and problem list.  Review of Systems Pertinent items noted in HPI and remainder of comprehensive ROS  otherwise negative.    Objective:   BP (!) 137/95 (BP Location: Left Arm, Patient Position: Sitting, Cuff Size: Large)   Pulse 83   Ht 5\' 8"  (1.727 m) Comment: Reported  Wt (!) 360 lb 6.4 oz (163.5 kg)   BMI 54.80 kg/m  VS reviewed, nursing note reviewed,  Constitutional: well developed, well nourished, no distress HEENT: normocephalic HEART: normal rate, heart sounds, regular rhythm RESP: normal effort, lung sounds clear and equal bilaterally  Breast Exam:  exam performed: right breast normal without mass, skin or nipple changes or axillary nodes, left breast normal without mass, skin or nipple changes or axillary nodes Abdomen: soft Neuro: alert and oriented x 3 Skin: warm, dry Psych: affect normal Pelvic exam:Performed: Cervix pink, visually closed, without lesion, scant white creamy discharge, vaginal walls and external genitalia normal Bimanual exam: Cervix 0/long/high, firm, anterior, neg CMT, uterus nontender, nonenlarged, adnexa without tenderness, enlargement, or mass       Assessment/Plan:   1. Screening for cervical cancer  - Cytology - PAP( [facility])  2. Well woman exam with routine gynecological exam   3. Dysmenorrhea --This is a stable chronic problem  - Transvaginal Non-OB; Future  4. Cysts of both ovaries  - US Transvaginal Non-OB; Future  5. Decreased appetite --This is a second stable chronic problem  --Discussed how weight loss may improve amenorrhea by balancing hormones --Pt not eating very much,  going all day without food sometimes due to low appetite --Recommend liquid calories, pt recently bought Nutribullet blender system.  Encouraged pt to search for low carb/diabetes friendly smoothie recipes online to get calories in/add nutrition. --Increase physical exercise, walk daily  6. 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 She has used progesterone to have period 2 months in a row, but no more than 2 due to pain. --Pt may benefit from more frequent dosing of progesterone, instead of 10 day therapy, like Slynd or Micronor.   --Will get outpatient [place] and pt to f/u with Dr Hyacinth Meeker   Return in about 2 months (around 05/05/2022) for With Dr Hyacinth Meeker , [facility] follow up for Abnormal Uterine Bleeding.   Sharen Counter, CNM 3:11 PM

## 2022-03-05 NOTE — Telephone Encounter (Signed)
Please send in a script for Dex Com G-7

## 2022-03-07 NOTE — Telephone Encounter (Signed)
Outcome Approvedon August 25 CaseId:[id number];Status:Approved;Review Type:Prior Auth;Coverage Start Date:02/22/2022;Coverage End Date:02/22/2023; This was sent to the pharmacy on 02/11/2022 by SB

## 2022-03-08 LAB — CYTOLOGY - PAP
Comment: NEGATIVE
Diagnosis: NEGATIVE
High risk HPV: NEGATIVE

## 2022-03-13 ENCOUNTER — Encounter (HOSPITAL_BASED_OUTPATIENT_CLINIC_OR_DEPARTMENT_OTHER): Payer: Self-pay | Admitting: Nurse Practitioner

## 2022-03-13 ENCOUNTER — Other Ambulatory Visit (HOSPITAL_BASED_OUTPATIENT_CLINIC_OR_DEPARTMENT_OTHER): Payer: Self-pay

## 2022-03-13 DIAGNOSIS — E11620 Type 2 diabetes mellitus with diabetic dermatitis: Secondary | ICD-10-CM

## 2022-03-15 MED ORDER — DEXCOM G7 SENSOR MISC: 11 refills | Status: DC

## 2022-03-15 MED ORDER — TRULICITY 1.5 MG/0.5ML ~~LOC~~ SOAJ: 1.5000 mg | SUBCUTANEOUS | 1 refills | Status: DC

## 2022-03-17 DIAGNOSIS — G8929 Other chronic pain: Secondary | ICD-10-CM | POA: Insufficient documentation

## 2022-03-17 MED ORDER — BASAGLAR KWIKPEN 100 UNIT/ML ~~LOC~~ SOPN: 20.0000 [IU] | PEN_INJECTOR | Freq: Two times a day (BID) | SUBCUTANEOUS | 3 refills | Status: DC

## 2022-03-17 NOTE — Assessment & Plan Note (Signed)
Chronic diabetes with recent DKA hospitalization due to loss of coverage for medications by insurance. At this time she onlyt taking her insulin glargine and only has one pen left, which was started during hospitalization. She should be on 20 units of insulin glargin 20 units twice a day and Novolog (insulin aspart) 20 units three times a day with meals. She was previously on Rybelsus and using the Dexcom for management, but these are no longer covered. We will obtain labs today for monitoring, I expect that her A1c will be elevated and likely her BG will not be controlled. Unfortunately, we do not have samples to supply her with. It does appear that Monica Hunter is covered by her insurance so I will send that in and go ahead and refill the Novolog and Dexcom to see if we can get coverage for that by her insurance. I recommend looking into patient assistance with the insulin manufacturers to see if she is eligible for discounted rates given the high cost of these medications. We will plan to restart the medications at the previous doses and monitor closely. Follow-up in 3 months or sooner if needed.

## 2022-03-17 NOTE — Assessment & Plan Note (Signed)
Chronic. Recommend daily physical activity with walking at least 10 minutes and slowly increasing time and distance. Recommend dietary changes to include low carbohydrate and low saturated fat options high in fresh vegetables and lean meats. Unfortunately GLP-1 medication not covered by her insurance. Recommend working diligently on diet and exercise for overall health and risk reduction of CV disease. Will plan to follow-up in 3 months or sooner if needed.

## 2022-03-17 NOTE — Assessment & Plan Note (Signed)
Chronic in nature. Recommend gentle stretching and daily walking to help with pain. Ibuprofen may also be helpful. Avoid lifting and moving heavy items- when lifting use leg muscles.

## 2022-03-17 NOTE — Assessment & Plan Note (Signed)
Chronic draining lesions to axilla and groin with tracking consistent with hidradenitis. Given the increased drainage and irritation, we will send treatment today with antibiotics to try to get improved control. Recommend follow-up if symptoms worsen or fail to improve. May need to consider I&D to help reduce discomfort.

## 2022-03-27 ENCOUNTER — Ambulatory Visit (HOSPITAL_BASED_OUTPATIENT_CLINIC_OR_DEPARTMENT_OTHER): Payer: 59

## 2022-03-27 LAB — HM DIABETES EYE EXAM

## 2022-04-15 ENCOUNTER — Encounter (HOSPITAL_BASED_OUTPATIENT_CLINIC_OR_DEPARTMENT_OTHER): Payer: Self-pay | Admitting: Nurse Practitioner

## 2022-04-15 ENCOUNTER — Encounter (HOSPITAL_BASED_OUTPATIENT_CLINIC_OR_DEPARTMENT_OTHER): Payer: Self-pay

## 2022-04-15 ENCOUNTER — Ambulatory Visit (INDEPENDENT_AMBULATORY_CARE_PROVIDER_SITE_OTHER): Payer: Commercial Managed Care - HMO | Admitting: Nurse Practitioner

## 2022-04-15 VITALS — BP 142/86 | HR 83 | Ht 68.0 in | Wt 357.2 lb

## 2022-04-15 DIAGNOSIS — B070 Plantar wart: Secondary | ICD-10-CM

## 2022-04-15 MED ORDER — FLUOROURACIL 0.5 % EX CREA: TOPICAL_CREAM | Freq: Every day | CUTANEOUS | 0 refills | Status: DC

## 2022-04-15 MED ORDER — SALICYLIC ACID WART REMOVER 27.5 % EX LIQD: CUTANEOUS | 1 refills | Status: DC

## 2022-04-15 NOTE — Progress Notes (Signed)
Patient not seen.

## 2022-04-17 ENCOUNTER — Ambulatory Visit (HOSPITAL_BASED_OUTPATIENT_CLINIC_OR_DEPARTMENT_OTHER): Payer: Commercial Managed Care - HMO

## 2022-04-17 ENCOUNTER — Other Ambulatory Visit (HOSPITAL_BASED_OUTPATIENT_CLINIC_OR_DEPARTMENT_OTHER): Payer: Self-pay | Admitting: Advanced Practice Midwife

## 2022-04-17 ENCOUNTER — Encounter (HOSPITAL_BASED_OUTPATIENT_CLINIC_OR_DEPARTMENT_OTHER): Payer: Self-pay | Admitting: Obstetrics & Gynecology

## 2022-04-17 ENCOUNTER — Ambulatory Visit (INDEPENDENT_AMBULATORY_CARE_PROVIDER_SITE_OTHER): Payer: Commercial Managed Care - HMO

## 2022-04-17 ENCOUNTER — Ambulatory Visit (INDEPENDENT_AMBULATORY_CARE_PROVIDER_SITE_OTHER): Payer: Commercial Managed Care - HMO | Admitting: Obstetrics & Gynecology

## 2022-04-17 VITALS — BP 135/89 | HR 86 | Ht 68.0 in | Wt 360.0 lb

## 2022-04-17 DIAGNOSIS — N83202 Unspecified ovarian cyst, left side: Secondary | ICD-10-CM

## 2022-04-17 DIAGNOSIS — E11620 Type 2 diabetes mellitus with diabetic dermatitis: Secondary | ICD-10-CM

## 2022-04-17 DIAGNOSIS — N946 Dysmenorrhea, unspecified: Secondary | ICD-10-CM

## 2022-04-17 DIAGNOSIS — N914 Secondary oligomenorrhea: Secondary | ICD-10-CM

## 2022-04-17 DIAGNOSIS — Z794 Long term (current) use of insulin: Secondary | ICD-10-CM

## 2022-04-17 DIAGNOSIS — Z23 Encounter for immunization: Secondary | ICD-10-CM

## 2022-04-17 DIAGNOSIS — N83201 Unspecified ovarian cyst, right side: Secondary | ICD-10-CM | POA: Diagnosis not present

## 2022-04-17 DIAGNOSIS — F17200 Nicotine dependence, unspecified, uncomplicated: Secondary | ICD-10-CM

## 2022-04-17 DIAGNOSIS — Z6841 Body Mass Index (BMI) 40.0 and over, adult: Secondary | ICD-10-CM | POA: Diagnosis not present

## 2022-04-17 NOTE — Progress Notes (Signed)
GYNECOLOGY  VISIT  CC:   follow up after ultrasound, irregular menses  HPI: 41 y.o. G0P0000 Single Black or [place] American female here for discussion after ultrasound.  Pt has h/o irregular but painful menses.  Also has diabetes and working on this.  Would like to consider pregnancy at some point.  Not interested in doing anything that would interfere with ability to get pregnancy in the future. Last hba1c was 12.2.  Pt feels this should be a lot better.  Would like to have tested today.  Highly recommended she not proceed with attempting pregnancy until this is less than 8.0 due to risks of fetal anomalies with elevated glucose.    Uterus is normal.  Ovaries normal except for two simple cysts on the right ovary.  Follow up not indicated.  Endometrium 5.77m.  Myometrium with streaking which could be caused by adenomyosis.   Past Medical History:  Diagnosis Date   Asthma    Diabetes mellitus    Morbid obesity ([place]     MEDS:   Current Outpatient Medications on File Prior to Visit  Medication Sig Dispense Refill   albuterol (VENTOLIN HFA) 108 (90 Base) MCG/ACT inhaler Inhale 1-2 puffs into the lungs every 4 (four) hours as needed. For shortness of breath 18 g 6   Blood Glucose Monitoring Suppl (CONTOUR NEXT GEN MONITOR) w/Device KIT See admin instructions.     clindamycin (CLEOCIN) 300 MG capsule Take 1 capsule (300 mg total) by mouth 3 (three) times daily. 21 capsule 0   Continuous Blood Gluc Receiver (DEXCOM G7 RECEIVER) DEVI Use to check BG 1 each 0   Continuous Blood Gluc Sensor (DEXCOM G7 SENSOR) MISC Apply one sensor to the abdomen every 10 days. 3 each 11   CONTOUR NEXT TEST test strip 3 (three) times daily.     Dulaglutide (TRULICITY) 1.5 MWY/6.1UOSOPN Inject 1.5 mg into the skin once a week. 2 mL 1   fluconazole (DIFLUCAN) 150 MG tablet Take one tablet every 3 days. 6 tablet 2   fluoruracil (CARAC) 0.5 % cream Apply topically daily. Apply 1 drop of salicylic acid with pea sized  amount of fluorouracil to the top of the wart only daily using q-tip. Allow to dry and cover with bandage. 30 g 0   hydrOXYzine (ATARAX) 25 MG tablet SMARTSIG:1 Tablet(s) By Mouth 1-3 Times Daily     ibuprofen (ADVIL) 800 MG tablet Take by mouth.     Insulin Pen Needle 31G X 6 MM MISC 1 each by Does not apply route 3 (three) times daily. 100 each 11   Microlet Lancets MISC USE TO CHECK BLOOD SUGAR AS DIRECTED WITH INSULIN THREE TIMES DAILY AND FOR SYMPTOMS OF HIGH OR LOW BLOOD SUGAR     montelukast (SINGULAIR) 10 MG tablet Take 1 tablet (10 mg total) by mouth at bedtime. 90 tablet 3   nystatin ointment (MYCOSTATIN) Apply 1 application. topically 2 (two) times daily. 30 g 6   ondansetron (ZOFRAN-ODT) 4 MG disintegrating tablet Take 1 tablet (4 mg total) by mouth every 8 (eight) hours as needed for nausea or vomiting. 20 tablet 0   Salicylic Acid (SALICYLIC ACID WART REMOVER) 27.5 % LIQD Apply 1 drop of salicylic acid with pea sized amount of fluorouracil to the top of the wart only daily using q-tip. Allow to dry and cover with bandage. 10 mL 1   triamcinolone cream (KENALOG) 0.1 % Apply 1 application. topically 2 (two) times daily. To affected area(s) as needed 30  g 3   valACYclovir (VALTREX) 1000 MG tablet Take 1 tablet (1,000 mg total) by mouth daily. 30 tablet 6   Vitamins A & D (VITAMIN A & D) ointment Apply 1 application. topically as needed for dry skin. Apply twice a day to rectum and vaginal area with nystatin cream. 45 g 3   No current facility-administered medications on file prior to visit.    ALLERGIES: Ceftriaxone, Methocarbamol, and Sulfa antibiotics  SH:  single, smoking  Review of Systems  Constitutional: Negative.     PHYSICAL EXAMINATION:    BP 135/89 (BP Location: Left Arm, Patient Position: Sitting, Cuff Size: Large)   Pulse 86   Ht _0  (1.727 m) Comment: Reported  Wt (!) 360 lb (163.3 kg)   BMI 54.74 kg/m     General appearance: alert, cooperative and appears  stated age No exam performed today  Assessment/Plan: 1. Secondary oligomenorrhea - will check causes of oligomenorrhea - Follicle stimulating hormone - TSH - Prolactin - Estradiol  2. Type 2 diabetes mellitus with diabetic dermatitis, with long-term current use of insulin (HCC) - will reassess glucose control - Hemoglobin A1c  3. Body mass index (BMI) 50.0-59.9, adult (HCC) - pt does need to continue to work on weight loss  4. Smoking

## 2022-04-18 LAB — HEMOGLOBIN A1C
Est. average glucose Bld gHb Est-mCnc: 151 mg/dL
Hgb A1c MFr Bld: 6.9 % — ABNORMAL HIGH (ref 4.8–5.6)

## 2022-04-18 LAB — ESTRADIOL: Estradiol: 164 pg/mL

## 2022-04-18 LAB — TSH: TSH: 0.466 u[IU]/mL (ref 0.450–4.500)

## 2022-04-18 LAB — FOLLICLE STIMULATING HORMONE: FSH: 2.8 m[IU]/mL

## 2022-04-18 LAB — PROLACTIN: Prolactin: 9.2 ng/mL (ref 4.8–23.3)

## 2022-04-24 ENCOUNTER — Other Ambulatory Visit (HOSPITAL_BASED_OUTPATIENT_CLINIC_OR_DEPARTMENT_OTHER): Payer: Self-pay

## 2022-04-24 ENCOUNTER — Telehealth (HOSPITAL_BASED_OUTPATIENT_CLINIC_OR_DEPARTMENT_OTHER): Payer: Self-pay

## 2022-04-24 MED ORDER — NORETHINDRONE 0.35 MG PO TABS: 1.0000 | ORAL_TABLET | Freq: Every day | ORAL | 2 refills | Status: DC

## 2022-05-03 NOTE — Telephone Encounter (Signed)
Patient has been scheduled to see Lattie Haw in 3 months. tbw

## 2022-05-13 ENCOUNTER — Encounter (HOSPITAL_BASED_OUTPATIENT_CLINIC_OR_DEPARTMENT_OTHER): Payer: Self-pay | Admitting: Nurse Practitioner

## 2022-05-13 ENCOUNTER — Ambulatory Visit (INDEPENDENT_AMBULATORY_CARE_PROVIDER_SITE_OTHER): Payer: Commercial Managed Care - HMO | Admitting: Nurse Practitioner

## 2022-05-13 DIAGNOSIS — L309 Dermatitis, unspecified: Secondary | ICD-10-CM

## 2022-05-13 DIAGNOSIS — J45909 Unspecified asthma, uncomplicated: Secondary | ICD-10-CM

## 2022-05-13 DIAGNOSIS — J4520 Mild intermittent asthma, uncomplicated: Secondary | ICD-10-CM

## 2022-05-13 DIAGNOSIS — G8929 Other chronic pain: Secondary | ICD-10-CM | POA: Diagnosis not present

## 2022-05-13 DIAGNOSIS — B009 Herpesviral infection, unspecified: Secondary | ICD-10-CM

## 2022-05-13 DIAGNOSIS — L509 Urticaria, unspecified: Secondary | ICD-10-CM | POA: Diagnosis not present

## 2022-05-13 DIAGNOSIS — M545 Low back pain, unspecified: Secondary | ICD-10-CM

## 2022-05-13 DIAGNOSIS — Z794 Long term (current) use of insulin: Secondary | ICD-10-CM

## 2022-05-13 DIAGNOSIS — J0140 Acute pansinusitis, unspecified: Secondary | ICD-10-CM

## 2022-05-13 DIAGNOSIS — E11620 Type 2 diabetes mellitus with diabetic dermatitis: Secondary | ICD-10-CM | POA: Diagnosis not present

## 2022-05-13 DIAGNOSIS — Z6841 Body Mass Index (BMI) 40.0 and over, adult: Secondary | ICD-10-CM

## 2022-05-13 MED ORDER — KETOROLAC TROMETHAMINE 60 MG/2ML IM SOLN
60.0000 mg | Freq: Once | INTRAMUSCULAR | Status: AC
  Administered 2022-05-13: 60 mg via INTRAMUSCULAR

## 2022-05-13 MED ORDER — AZITHROMYCIN 250 MG PO TABS: ORAL_TABLET | ORAL | 0 refills | Status: AC

## 2022-05-13 MED ORDER — MONTELUKAST SODIUM 10 MG PO TABS: 10.0000 mg | ORAL_TABLET | Freq: Every day | ORAL | 3 refills | Status: DC

## 2022-05-13 MED ORDER — FAMOTIDINE 20 MG PO TABS: 20.0000 mg | ORAL_TABLET | Freq: Two times a day (BID) | ORAL | 2 refills | Status: DC

## 2022-05-13 MED ORDER — TRULICITY 1.5 MG/0.5ML ~~LOC~~ SOAJ: 1.5000 mg | SUBCUTANEOUS | 1 refills | Status: DC

## 2022-05-13 MED ORDER — DEXAMETHASONE SODIUM PHOSPHATE 10 MG/ML IJ SOLN
10.0000 mg | Freq: Once | INTRAMUSCULAR | Status: AC
  Administered 2022-05-13: 10 mg via INTRAMUSCULAR

## 2022-05-13 MED ORDER — VALACYCLOVIR HCL 1 G PO TABS: 1000.0000 mg | ORAL_TABLET | Freq: Every day | ORAL | 6 refills | Status: AC

## 2022-05-13 MED ORDER — ALBUTEROL SULFATE HFA 108 (90 BASE) MCG/ACT IN AERS: 1.0000 | INHALATION_SPRAY | RESPIRATORY_TRACT | 6 refills | Status: DC | PRN

## 2022-05-13 MED ORDER — HYDROXYZINE HCL 25 MG PO TABS: 25.0000 mg | ORAL_TABLET | Freq: Three times a day (TID) | ORAL | 3 refills | Status: DC | PRN

## 2022-05-13 NOTE — Progress Notes (Signed)
[name], DNP, AGNP-c [facility] [address] [place], [place] [address] [phone] Office [phone] Fax  ESTABLISHED PATIENT- Chronic Health and/or Follow-Up Visit  Blood pressure 131/78, pulse 79, height 5\' 8"  (1.727 m), weight (!) 358 lb (162.4 kg), SpO2 (!) 86 %.    Monica Hunter is a 41 y.o. year old female presenting today for evaluation and management of the following: Follow-up, Diabetes (Having stomach cramps, refills on medications ), Nasal Congestion, and Back Pain (Fell about a week in a half ago. Tried to stand up and instantly fell down)  Fall Sudden onset of back pain, she couldn't stand up straight and fell down. She denies injury with the fall. She has seen the chiropractor and had imaging- scoliosis, DDD, joint space narrowing.  She is working towards disability. She tells me the judge approved it, but now SS is saying that he did not get enough evidence and now they are fighting back on the decision.   Congested and Wheezing this morning She tells me that this her symptoms started a few days ago. She initially had an allergic reaction to something and now the sinus and asthma symptoms are worse. She is on hydroxyzine for chronic "breakouts" - head to toe blotches that are itchy. She tells me that hte hydroxyzine helps but the areas are still painful. She doesn't know what to do about this. She feels like this all stems from getting rocephin in IV at the hospital.   All ROS negative with exception of what is listed above.   PHYSICAL EXAM Physical Exam Vitals and nursing note reviewed.  Constitutional:      General: She is not in acute distress.    Appearance: Normal appearance.  HENT:     Head: Normocephalic.  Eyes:     Extraocular Movements: Extraocular movements intact.     Conjunctiva/sclera: Conjunctivae normal.     Pupils: Pupils are equal, round, and reactive to light.  Neck:     Vascular: No  carotid bruit.  Cardiovascular:     Rate and Rhythm: Normal rate and regular rhythm.     Pulses: Normal pulses.     Heart sounds: Normal heart sounds. No murmur heard. Pulmonary:     Effort: Pulmonary effort is normal.     Breath sounds: Normal breath sounds. No wheezing.  Abdominal:     General: Bowel sounds are normal. There is no distension.     Palpations: Abdomen is soft.     Tenderness: There is no abdominal tenderness. There is no guarding.  Musculoskeletal:        General: Tenderness present.     Cervical back: Normal range of motion and neck supple.     Right lower leg: No edema.     Left lower leg: No edema.       Legs:  Lymphadenopathy:     Cervical: No cervical adenopathy.  Skin:    General: Skin is warm and dry.     Capillary Refill: Capillary refill takes less than 2 seconds.  Neurological:     General: No focal deficit present.     Mental Status: She is alert and oriented to person, place, and time.     Sensory: No sensory deficit.     Motor: No weakness.  Psychiatric:        Mood and Affect: Mood normal.        Behavior: Behavior normal.        Thought Content:  Thought content normal.        Judgment: Judgment normal.     PLAN Problem List Items Addressed This Visit     Asthma    Chronic. Wheezing present on examination. Smoking history positive. With current symptoms suspect infection likely. Will start antibiotics, refill albuterol, and send referral to allergy and asthma. No alarm sx present at this time.       Relevant Medications   albuterol (VENTOLIN HFA) 108 (90 Base) MCG/ACT inhaler   montelukast (SINGULAIR) 10 MG tablet   Other Relevant Orders   CBC with Differential/Platelet (Completed)   Ambulatory referral to Allergy   Class 3 severe obesity due to excess calories without serious comorbidity with body mass index (BMI) of 50.0 to 59.9 in adult [facility])    Chronic. Diet and exercise recommended. I am hopeful if her back begins to feel better  she will have improvement of her ability to exercise and reduction in chronic conditions.       Relevant Medications   Dulaglutide (TRULICITY) 1.5 MG/0.5ML SOPN   Type 2 diabetes mellitus with diabetic dermatitis, with long-term current use of insulin (HCC)    Chronic. Most recent A1c unknown-pending. Recommend: Daily glucose monitoring, AM fasting blood sugar 70-110, 2 hours after meals blood sugar <150, 150-180 grams of carbohydrates and 60-70 grams of protein per day. Exercise at least 20 minutes per day, Blood pressure monitoring weekly with goal <130/80. Continue diabetes medications ordered Labs 3 months  Follow-Up: 3 weeks       Relevant Medications   Dulaglutide (TRULICITY) 1.5 MG/0.5ML SOPN   Other Relevant Orders   CBC with Differential/Platelet (Completed)   Comprehensive metabolic panel (Completed)   Chronic bilateral low back pain without sciatica    Long term low back pain with worsening symptoms following recent fall. No alarm sx present at this time. Given the severity of her symptoms and recent fall, we will send referral to spinal specialist for evaluation.       Relevant Orders   Ambulatory referral to Spine Surgery   Other Visit Diagnoses     Urticaria       Relevant Medications   famotidine (PEPCID) 20 MG tablet   hydrOXYzine (ATARAX) 25 MG tablet   montelukast (SINGULAIR) 10 MG tablet   Other Relevant Orders   CBC with Differential/Platelet (Completed)   Comprehensive metabolic panel (Completed)   TSH (Completed)   Ambulatory referral to Allergy   Acute non-recurrent pansinusitis       Relevant Medications   valACYclovir (VALTREX) 1000 MG tablet   Dermatitis       Relevant Medications   hydrOXYzine (ATARAX) 25 MG tablet   montelukast (SINGULAIR) 10 MG tablet   HSV infection       Relevant Medications   valACYclovir (VALTREX) 1000 MG tablet       Return in about 3 months (around 08/13/2022) for DM, HTN, Obesity.   [name], DNP,  AGNP-c 05/13/2022 10:39 AM

## 2022-05-13 NOTE — Patient Instructions (Addendum)
Congratulations on the lower A1c!! Keep up the great work.   I want you to increase your water intake and fiber intake to help make your bowel movements a little more solid and less like mud.   Your hormone levels looked good. They were all in the normal range.   I have sent a referral to the spinal doctor to see if there is anything that can be done to help with your back pain.   I have sent in an antibiotic for your sinus symptoms.   I will see what  the insurance requires about your trulicity. I may need to send in a new prescription to trigger them to send in the prior authorization.

## 2022-05-14 LAB — COMPREHENSIVE METABOLIC PANEL
ALT: 15 IU/L (ref 0–32)
AST: 14 IU/L (ref 0–40)
Albumin/Globulin Ratio: 1.4 (ref 1.2–2.2)
Albumin: 4.3 g/dL (ref 3.9–4.9)
Alkaline Phosphatase: 88 IU/L (ref 44–121)
BUN/Creatinine Ratio: 18 (ref 9–23)
BUN: 9 mg/dL (ref 6–20)
Bilirubin Total: <0.2 mg/dL (ref 0.0–1.2)
CO2: 23 mmol/L (ref 20–29)
Calcium: 9.3 mg/dL (ref 8.7–10.2)
Chloride: 103 mmol/L (ref 96–106)
Creatinine, Ser: 0.51 mg/dL — ABNORMAL LOW (ref 0.57–1.00)
Globulin, Total: 3.1 g/dL (ref 1.5–4.5)
Glucose: 99 mg/dL (ref 70–99)
Potassium: 4.5 mmol/L (ref 3.5–5.2)
Sodium: 139 mmol/L (ref 134–144)
Total Protein: 7.4 g/dL (ref 6.0–8.5)
eGFR: 122 mL/min/{1.73_m2} (ref >59)

## 2022-05-14 LAB — CBC WITH DIFFERENTIAL/PLATELET
Basophils Absolute: 0 10*3/uL (ref 0.0–0.2)
Basos: 0 %
EOS (ABSOLUTE): 0.2 10*3/uL (ref 0.0–0.4)
Eos: 3 %
Hematocrit: 42.6 % (ref 34.0–46.6)
Hemoglobin: 14.3 g/dL (ref 11.1–15.9)
Immature Grans (Abs): 0 10*3/uL (ref 0.0–0.1)
Immature Granulocytes: 1 %
Lymphocytes Absolute: 3.3 10*3/uL — ABNORMAL HIGH (ref 0.7–3.1)
Lymphs: 42 %
MCH: 30.9 pg (ref 26.6–33.0)
MCHC: 33.6 g/dL (ref 31.5–35.7)
MCV: 92 fL (ref 79–97)
Monocytes Absolute: 0.5 10*3/uL (ref 0.1–0.9)
Monocytes: 6 %
Neutrophils Absolute: 3.9 10*3/uL (ref 1.4–7.0)
Neutrophils: 48 %
Platelets: 351 10*3/uL (ref 150–450)
RBC: 4.63 x10E6/uL (ref 3.77–5.28)
RDW: 12.9 % (ref 11.7–15.4)
WBC: 7.9 10*3/uL (ref 3.4–10.8)

## 2022-05-14 LAB — TSH: TSH: 0.867 u[IU]/mL (ref 0.450–4.500)

## 2022-05-28 ENCOUNTER — Other Ambulatory Visit (HOSPITAL_BASED_OUTPATIENT_CLINIC_OR_DEPARTMENT_OTHER): Payer: Self-pay | Admitting: Nurse Practitioner

## 2022-05-28 DIAGNOSIS — E11620 Type 2 diabetes mellitus with diabetic dermatitis: Secondary | ICD-10-CM

## 2022-06-04 ENCOUNTER — Telehealth: Payer: Self-pay | Admitting: Nurse Practitioner

## 2022-06-04 NOTE — Telephone Encounter (Signed)
Pt called and needs a PA for Trulicity, she said it ran out as of today and when you do the PA her insurance only covers it for one month at a time.

## 2022-06-05 NOTE — Telephone Encounter (Signed)
Pt called again about Trulicity and I let her know we have lots of these PA's in the works  Pt states her insurance will only cover 1 month at a time with PA, not the 3 mo supply that she writes RX for

## 2022-06-05 NOTE — Telephone Encounter (Signed)
P.A. TRULICITY completed response says covered medication, no P.A. needed.  Called pharmacy & had them rerun it & went thru for $15 & it is in stock.  They could not explain why it would not go thru before.  Called pt and explained  Also pt states insurance will only pay for 30 days at a time.

## 2022-06-11 ENCOUNTER — Other Ambulatory Visit (HOSPITAL_BASED_OUTPATIENT_CLINIC_OR_DEPARTMENT_OTHER): Payer: Self-pay | Admitting: Nurse Practitioner

## 2022-06-11 DIAGNOSIS — L509 Urticaria, unspecified: Secondary | ICD-10-CM

## 2022-06-17 ENCOUNTER — Encounter (HOSPITAL_BASED_OUTPATIENT_CLINIC_OR_DEPARTMENT_OTHER): Payer: Self-pay | Admitting: Advanced Practice Midwife

## 2022-06-17 ENCOUNTER — Ambulatory Visit (INDEPENDENT_AMBULATORY_CARE_PROVIDER_SITE_OTHER): Payer: Commercial Managed Care - HMO | Admitting: Advanced Practice Midwife

## 2022-06-17 VITALS — BP 131/86 | HR 92 | Ht 68.0 in | Wt 365.2 lb

## 2022-06-17 DIAGNOSIS — N914 Secondary oligomenorrhea: Secondary | ICD-10-CM | POA: Diagnosis not present

## 2022-06-17 DIAGNOSIS — L0291 Cutaneous abscess, unspecified: Secondary | ICD-10-CM | POA: Diagnosis not present

## 2022-06-17 MED ORDER — DOXYCYCLINE HYCLATE 100 MG PO CAPS: 100.0000 mg | ORAL_CAPSULE | Freq: Two times a day (BID) | ORAL | 0 refills | Status: DC

## 2022-06-17 MED ORDER — MEDROXYPROGESTERONE ACETATE 10 MG PO TABS: 10.0000 mg | ORAL_TABLET | Freq: Every day | ORAL | 10 refills | Status: DC

## 2022-06-17 NOTE — Progress Notes (Signed)
    GYNECOLOGY PROGRESS NOTE  History:  41 y.o. G0P0000 presents to [facility] office today for problem gyn visit. She reports menses x 2 on Micronor, last one 3 weeks long, started bleeding yesterday for second time. With pad use, she has developed a boil on her rectal area similar to previous boils. She took doxycycline from PCP previously and took Epsom salt baths and the boil resolved.  Without medications, she usually does not have menses. She desires pregnancy.    She brought her A1C down from 12+ to 6.9 in 4 months with the use of Dexcom glucose monitor.  She denies h/a, dizziness, shortness of breath, n/v, or fever/chills.    The following portions of the patient's history were reviewed and updated as appropriate: allergies, current medications, past family history, past medical history, past social history, past surgical history and problem list. Last pap smear on 03/05/22 was normal, neg HRHPV.  Health Maintenance Due  Topic Date Due   Diabetic kidney evaluation - Urine ACR  Never done   Hepatitis C Screening  Never done   DTaP/Tdap/Td (1 - Tdap) Never done   COVID-19 Vaccine (3 - 2023-24 season) 03/01/2022     Review of Systems:  Pertinent items are noted in HPI.   Objective:  Physical Exam Blood pressure 131/86, pulse 92, height _0  (1.727 m), weight (!) 365 lb 3.2 oz (165.7 kg). VS reviewed, nursing note reviewed,  Constitutional: well developed, well nourished, no distress HEENT: normocephalic CV: normal rate Pulm/chest wall: normal effort Breast Exam: deferred Abdomen: soft Neuro: alert and oriented x 3 Skin: warm, dry Psych: affect normal Pelvic exam: Deferred  Assessment & Plan:  1. Cutaneous abscess, unspecified site --Pt with multiple abx allergies.  Doxycycline was effective with previous tx. - doxycycline (VIBRAMYCIN) 100 MG capsule; Take 1 capsule (100 mg total) by mouth 2 (two) times daily.  Dispense: 14 capsule; Refill: 0  2. Secondary  oligomenorrhea --Since pt desires pregnancy, will have her complete current month of micronor, then wait for next menses.  If no menses in 30 days, take 10 days of Provera.  Attempt pregnancy with timing, by using fertility app, and ovulation kit, which pt has already purchased. May repeat if menses delayed the following month.  --F/U with Dr Sabra Heck in 3 months  --Continue pt lifestyle changes and glucose control  - medroxyPROGESTERone (PROVERA) 10 MG tablet; Take 1 tablet (10 mg total) by mouth daily.  Dispense: 10 tablet; Refill: 10   No follow-ups on file.   Fatima Blank, CNM 6:40 PM

## 2022-06-18 NOTE — Telephone Encounter (Signed)
Patient came in on 12/18 to see Estill Bamberg

## 2022-06-21 ENCOUNTER — Ambulatory Visit: Payer: Commercial Managed Care - HMO | Admitting: Internal Medicine

## 2022-06-28 NOTE — Assessment & Plan Note (Signed)
Long term low back pain with worsening symptoms following recent fall. No alarm sx present at this time. Given the severity of her symptoms and recent fall, we will send referral to spinal specialist for evaluation.

## 2022-06-28 NOTE — Assessment & Plan Note (Signed)
Chronic. Wheezing present on examination. Smoking history positive. With current symptoms suspect infection likely. Will start antibiotics, refill albuterol, and send referral to allergy and asthma. No alarm sx present at this time.

## 2022-06-28 NOTE — Assessment & Plan Note (Signed)
Chronic. Diet and exercise recommended. I am hopeful if her back begins to feel better she will have improvement of her ability to exercise and reduction in chronic conditions.

## 2022-06-28 NOTE — Assessment & Plan Note (Signed)
Chronic. Most recent A1c unknown-pending. Recommend: Daily glucose monitoring, AM fasting blood sugar 70-110, 2 hours after meals blood sugar <150, 150-180 grams of carbohydrates and 60-70 grams of protein per day. Exercise at least 20 minutes per day, Blood pressure monitoring weekly with goal <130/80. Continue diabetes medications ordered Labs 3 months  Follow-Up: 3 weeks

## 2022-07-05 ENCOUNTER — Telehealth: Payer: Commercial Managed Care - HMO | Admitting: Family Medicine

## 2022-07-05 DIAGNOSIS — R499 Unspecified voice and resonance disorder: Secondary | ICD-10-CM

## 2022-07-05 DIAGNOSIS — J029 Acute pharyngitis, unspecified: Secondary | ICD-10-CM

## 2022-07-05 NOTE — Patient Instructions (Signed)
Monica Hunter, thank you for joining Perlie Mayo, NP for today's virtual visit.  While this provider is not your primary care provider (PCP), if your PCP is located in our provider database this encounter information will be shared with them immediately following your visit.   [facility] account gives you access to today's visit and all your visits, tests, and labs performed at [facility] " click here if you don't have a [facility] account or go to [email or web address]  Consent: (Patient) Monica Hunter provided verbal consent for this virtual visit at the beginning of the encounter.  Current Medications:  Current Outpatient Medications:    albuterol (VENTOLIN HFA) 108 (90 Base) MCG/ACT inhaler, Inhale 1-2 puffs into the lungs every 4 (four) hours as needed. For shortness of breath, Disp: 18 g, Rfl: 6   Continuous Blood Gluc Receiver ([place]) DEVI, Use to check BG, Disp: 1 each, Rfl: 0   Continuous Blood Gluc Sensor (DEXCOM G7 SENSOR) MISC, Apply one sensor to the abdomen every 10 days., Disp: 3 each, Rfl: 11   doxycycline (VIBRAMYCIN) 100 MG capsule, Take 1 capsule (100 mg total) by mouth 2 (two) times daily., Disp: 14 capsule, Rfl: 0   Dulaglutide (TRULICITY) 1.5 HG/9.9ME SOPN, Inject 1.5 mg into the skin once a week., Disp: 2 mL, Rfl: 1   famotidine (PEPCID) 20 MG tablet, Take 1 tablet (20 mg total) by mouth 2 (two) times daily. Trial this for 2 months for rash, Disp: 60 tablet, Rfl: 2   fluconazole (DIFLUCAN) 150 MG tablet, Take one tablet every 3 days., Disp: 6 tablet, Rfl: 2   fluoruracil (CARAC) 0.5 % cream, Apply topically daily. Apply 1 drop of salicylic acid with pea sized amount of fluorouracil to the top of the wart only daily using q-tip. Allow to dry and cover with bandage., Disp: 30 g, Rfl: 0   hydrOXYzine (ATARAX) 25 MG tablet, Take 1 tablet (25 mg total) by mouth every 8 (eight) hours as needed., Disp: 90 tablet, Rfl:  3   ibuprofen (ADVIL) 800 MG tablet, Take by mouth., Disp: , Rfl:    medroxyPROGESTERone (PROVERA) 10 MG tablet, Take 1 tablet (10 mg total) by mouth daily., Disp: 10 tablet, Rfl: 10   montelukast (SINGULAIR) 10 MG tablet, Take 1 tablet (10 mg total) by mouth at bedtime., Disp: 90 tablet, Rfl: 3   nystatin ointment (MYCOSTATIN), Apply 1 application. topically 2 (two) times daily., Disp: 30 g, Rfl: 6   ondansetron (ZOFRAN-ODT) 4 MG disintegrating tablet, Take 1 tablet (4 mg total) by mouth every 8 (eight) hours as needed for nausea or vomiting., Disp: 20 tablet, Rfl: 0   Salicylic Acid (SALICYLIC ACID WART REMOVER) 27.5 % LIQD, Apply 1 drop of salicylic acid with pea sized amount of fluorouracil to the top of the wart only daily using q-tip. Allow to dry and cover with bandage., Disp: 10 mL, Rfl: 1   triamcinolone cream (KENALOG) 0.1 %, Apply 1 application. topically 2 (two) times daily. To affected area(s) as needed, Disp: 30 g, Rfl: 3   valACYclovir (VALTREX) 1000 MG tablet, Take 1 tablet (1,000 mg total) by mouth daily., Disp: 30 tablet, Rfl: 6   Vitamins A & D (VITAMIN A & D) ointment, Apply 1 application. topically as needed for dry skin. Apply twice a day to rectum and vaginal area with nystatin cream., Disp: 45 g, Rfl: 3   Medications ordered in this encounter:  No orders of the defined types were  placed in this encounter.    *If you need refills on other medications prior to your next appointment, please contact your pharmacy*  Follow-Up: Call back or seek an in-person evaluation if the symptoms worsen or if the condition fails to improve as anticipated.  [place] [phone]  Other Instructions  Please go to the nearest ED for evaluation   If you have been instructed to have an in-person evaluation today at a local Urgent Care facility, please use the link below. It will take you to a list of all of our available [facility], including address, phone  number and hours of operation. Please do not delay care.  [facility]  If you or a family member do not have a primary care provider, use the link below to schedule a visit and establish care. When you choose a [facility] primary care physician or advanced practice provider, you gain a long-term partner in health. Find a Primary Care Provider  Learn more about 's in-office and virtual care options: [facility]

## 2022-07-05 NOTE — Progress Notes (Signed)
Virtual Visit Consent   Monica Hunter, you are scheduled for a virtual visit with a [facility] provider today. Just as with appointments in the office, your consent must be obtained to participate. Your consent will be active for this visit and any virtual visit you may have with one of our providers in the next 365 days. If you have a MyChart account, a copy of this consent can be sent to you electronically.  As this is a virtual visit, video technology does not allow for your provider to perform a traditional examination. This may limit your provider's ability to fully assess your condition. If your provider identifies any concerns that need to be evaluated in person or the need to arrange testing (such as labs, EKG, etc.), we will make arrangements to do so. Although advances in technology are sophisticated, we cannot ensure that it will always work on either your end or our end. If the connection with a video visit is poor, the visit may have to be switched to a telephone visit. With either a video or telephone visit, we are not always able to ensure that we have a secure connection.  By engaging in this virtual visit, you consent to the provision of healthcare and authorize for your insurance to be billed (if applicable) for the services provided during this visit. Depending on your insurance coverage, you may receive a charge related to this service.  I need to obtain your verbal consent now. Are you willing to proceed with your visit today? Monica Hunter has provided verbal consent on 07/05/2022 for a virtual visit (video or telephone). Freddy Finner, NP  Date: 07/05/2022 12:52 PM  Virtual Visit via Video Note   I, Freddy Finner, connected with  Monica Hunter  ([id number], [birth year]) on 07/05/22 at  1:45 PM EST by a video-enabled telemedicine application and verified that I am speaking with the correct person using two identifiers.  Location: Patient: Virtual Visit Location  Patient: Home Provider: Virtual Visit Location Provider: Home Office   I discussed the limitations of evaluation and management by telemedicine and the availability of in person appointments. The patient expressed understanding and agreed to proceed.    History of Present Illness: Monica Hunter is a 42 y.o. who identifies as a female who was assigned female at birth, and is being seen today for sore throat. Very painful to talk, swallow- hot potato voice noted.  Advised pt to go to ED for evaluation.   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 For shortness of breath, Disp: 18 g,  Rfl: 6   Continuous Blood Gluc Receiver ([place]) DEVI, Use to check BG, Disp: 1 each, Rfl: 0   Continuous Blood Gluc Sensor (DEXCOM G7 SENSOR) MISC, Apply one sensor to the abdomen every 10 days., Disp: 3 each, Rfl: 11   doxycycline (VIBRAMYCIN) 100 MG capsule, Take 1 capsule (100 mg total) by mouth 2 (two) times daily., Disp: 14 capsule, Rfl: 0   Dulaglutide  (TRULICITY) 1.5 ZO/1.0RU SOPN, Inject 1.5 mg into the skin once a week., Disp: 2 mL, Rfl: 1   famotidine (PEPCID) 20 MG tablet, Take 1 tablet (20 mg total) by mouth 2 (two) times daily. Trial this for 2 months for rash, Disp: 60 tablet, Rfl: 2   fluconazole (DIFLUCAN) 150 MG tablet, Take one tablet every 3 days., Disp: 6 tablet, Rfl: 2   fluoruracil (CARAC) 0.5 % cream, Apply topically daily. Apply 1 drop of salicylic acid with pea sized amount of fluorouracil to the top of the wart only daily using q-tip. Allow to dry and cover with bandage., Disp: 30 g, Rfl: 0   hydrOXYzine (ATARAX) 25 MG tablet, Take 1 tablet (25 mg total) by mouth every 8 (eight) hours as needed., Disp: 90 tablet, Rfl: 3   ibuprofen (ADVIL) 800 MG tablet, Take by mouth., Disp: , Rfl:    medroxyPROGESTERone (PROVERA) 10 MG tablet, Take 1 tablet (10 mg total) by mouth daily., Disp: 10 tablet, Rfl: 10   montelukast (SINGULAIR) 10 MG tablet, Take 1 tablet (10 mg total) by mouth at bedtime., Disp: 90 tablet, Rfl: 3   nystatin ointment (MYCOSTATIN), Apply 1 application. topically 2 (two) times daily., Disp: 30 g, Rfl: 6   ondansetron (ZOFRAN-ODT) 4 MG disintegrating tablet, Take 1 tablet (4 mg total) by mouth every 8 (eight) hours as needed for nausea or vomiting., Disp: 20 tablet, Rfl: 0   Salicylic Acid (SALICYLIC ACID WART REMOVER) 27.5 % LIQD, Apply 1 drop of salicylic acid with pea sized amount of fluorouracil to the top of the wart only daily using q-tip. Allow to dry and cover with bandage., Disp: 10 mL, Rfl: 1   triamcinolone cream (KENALOG) 0.1 %, Apply 1 application. topically 2 (two) times daily. To affected area(s) as needed, Disp: 30 g, Rfl: 3   valACYclovir (VALTREX) 1000 MG tablet, Take 1 tablet (1,000 mg total) by mouth daily., Disp: 30 tablet, Rfl: 6   Vitamins A & D (VITAMIN A & D) ointment, Apply 1 application. topically as needed for dry skin. Apply twice a day to rectum and vaginal area with nystatin cream., Disp: 45  g, Rfl: 3  Observations/Objective: Patient is well-developed, well-nourished in no acute distress.  Resting comfortably  at home.  Head is normocephalic, atraumatic.  No labored breathing.  Speech is clear and coherent with logical content.  Patient is alert and oriented at baseline.    Assessment and Plan:   1. Hoarseness or changing voice   2. Sore throat   Noted hot potato voice, with very painful time to talk and swallow- advised for in person given airway risk and pain. Pt going to ED.   Patient acknowledged agreement and understanding of the plan.     Follow Up Instructions: I discussed the assessment and treatment plan with the patient. The patient was provided an opportunity to ask questions and all were answered. The patient agreed with the plan and demonstrated an understanding of the instructions.  A copy of instructions were sent to the patient via MyChart unless otherwise noted  below.    The patient was advised to call back or seek an in-person evaluation if the symptoms worsen or if the condition fails to improve as anticipated.  Time:  I spent 10 minutes with the patient via telehealth technology discussing the above problems/concerns.    Perlie Mayo, NP

## 2022-07-06 ENCOUNTER — Other Ambulatory Visit: Payer: Self-pay

## 2022-07-06 ENCOUNTER — Emergency Department (HOSPITAL_COMMUNITY)
Admission: EM | Admit: 2022-07-06 | Discharge: 2022-07-07 | Disposition: A | Discharge status: Home / Self Care | Payer: Commercial Managed Care - HMO | Attending: Emergency Medicine | Admitting: Emergency Medicine

## 2022-07-06 DIAGNOSIS — J029 Acute pharyngitis, unspecified: Secondary | ICD-10-CM | POA: Diagnosis present

## 2022-07-07 ENCOUNTER — Other Ambulatory Visit: Payer: Self-pay

## 2022-07-07 MED ORDER — AZITHROMYCIN 250 MG PO TABS: 250.0000 mg | ORAL_TABLET | Freq: Every day | ORAL | 0 refills | Status: DC

## 2022-07-07 MED ORDER — LIDOCAINE VISCOUS HCL 2 % MT SOLN: 15.0000 mL | Freq: Four times a day (QID) | OROMUCOSAL | 0 refills | Status: DC | PRN

## 2022-07-07 NOTE — ED Triage Notes (Signed)
Patient reports sore throat with mild swelling for 3 days , denies cough or fever .

## 2022-07-07 NOTE — Discharge Instructions (Signed)
Please have an appointment to follow-up with your primary care doctor.  Return to emergency room for any new or concerning symptoms.  However warm salt water, hydrate, liquid diet  Please use Tylenol or ibuprofen for pain.  You may use 600 mg ibuprofen every 6 hours or 1000 mg of Tylenol every 6 hours.  You may choose to alternate between the 2.  This would be most effective.  Not to exceed 4 g of Tylenol within 24 hours.  Not to exceed 3200 mg ibuprofen 24 hours.   Viral Illness TREATMENT  Treatment is directed at relieving symptoms. There is no cure. Antibiotics are not effective, because the infection is caused by a virus, not by bacteria. Treatment may include:  Increased fluid intake. Sports drinks offer valuable electrolytes, sugars, and fluids.  Breathing heated mist or steam (vaporizer or shower).  Eating chicken soup or other clear broths, and maintaining good nutrition.  Getting plenty of rest.  Using gargles or lozenges for comfort.  Increasing usage of your inhaler if you have asthma.  Return to work when your temperature has returned to normal.  Gargle warm salt water and spit it out for sore throat. Take benadryl to decrease sinus secretions. Continue to alternate between Tylenol and ibuprofen for pain and fever control.  Follow Up: Follow up with your primary care doctor in 5-7 days for recheck of ongoing symptoms.  Return to emergency department for emergent changing or worsening of symptoms.    Please take all of your antibiotics until finished!   You may develop abdominal discomfort or diarrhea from the antibiotic.  You may help offset this with probiotics which you can buy or get in yogurt. Do not eat  or take the probiotics until 2 hours after your antibiotic.

## 2022-07-07 NOTE — ED Notes (Signed)
Pt A&OX4 ambulatory at d/c with independent steady gait. Pt verbalized understanding of d/c instructions, prescriptions and follow up care. 

## 2022-07-07 NOTE — ED Provider Notes (Signed)
[facility] EMERGENCY DEPARTMENT Provider Note   CSN: [id number] Arrival date & time: 07/06/22  2316     History  Chief Complaint  Patient presents with   Sore Throat    Monica Hunter is a 42 y.o. female.   Sore Throat  Patient is a 42 year old female with a sore throat for 4 days now.  No cough congestion sinus pressure no nausea vomiting or diarrhea.  She has recently been exposed to strep throat.  She denies any fevers at home.      Home Medications Prior to Admission medications   Medication Sig Start Date End Date Taking? Authorizing Provider  azithromycin (ZITHROMAX) 250 MG tablet Take 1 tablet (250 mg total) by mouth daily. Take first 2 tablets together, then 1 every day until finished. 07/07/22  Yes Gurbani Figge, Kathleene Hazel, PA  magic mouthwash (lidocaine, diphenhydrAMINE, alum & mag hydroxide) suspension Swish and swallow 15 mLs 4 (four) times daily as needed for mouth pain. 07/07/22  Yes Madlynn Lundeen S, PA  albuterol (VENTOLIN HFA) 108 (90 Base) MCG/ACT inhaler Inhale 1-2 puffs into the lungs every 4 (four) hours as needed. For shortness of breath 05/13/22   Early, Coralee Pesa, NP  Continuous Blood Gluc Receiver (DEXCOM G7 RECEIVER) DEVI Use to check BG 12/03/21   Early, Coralee Pesa, NP  Continuous Blood Gluc Sensor (DEXCOM G7 SENSOR) MISC Apply one sensor to the abdomen every 10 days. 03/15/22   Orma Render, NP  doxycycline (VIBRAMYCIN) 100 MG capsule Take 1 capsule (100 mg total) by mouth 2 (two) times daily. 06/17/22   Leftwich-Kirby, Kathie Dike, CNM  Dulaglutide (TRULICITY) 1.5 DG/3.8VF SOPN Inject 1.5 mg into the skin once a week. 05/13/22   Orma Render, NP  famotidine (PEPCID) 20 MG tablet Take 1 tablet (20 mg total) by mouth 2 (two) times daily. Trial this for 2 months for rash 05/13/22   Early, Coralee Pesa, NP  fluconazole (DIFLUCAN) 150 MG tablet Take one tablet every 3 days. 12/03/21   Orma Render, NP  fluoruracil [facility]) 0.5 % cream Apply topically daily. Apply 1  drop of salicylic acid with pea sized amount of fluorouracil to the top of the wart only daily using q-tip. Allow to dry and cover with bandage. 04/15/22   Orma Render, NP  hydrOXYzine (ATARAX) 25 MG tablet Take 1 tablet (25 mg total) by mouth every 8 (eight) hours as needed. 05/13/22   Orma Render, NP  ibuprofen (ADVIL) 800 MG tablet Take by mouth. 12/03/15   [provider]  medroxyPROGESTERone (PROVERA) 10 MG tablet Take 1 tablet (10 mg total) by mouth daily. 06/17/22   Leftwich-Kirby, Kathie Dike, CNM  montelukast (SINGULAIR) 10 MG tablet Take 1 tablet (10 mg total) by mouth at bedtime. 05/13/22   Orma Render, NP  nystatin ointment (MYCOSTATIN) Apply 1 application. topically 2 (two) times daily. 11/29/21   Early, Coralee Pesa, NP  ondansetron (ZOFRAN-ODT) 4 MG disintegrating tablet Take 1 tablet (4 mg total) by mouth every 8 (eight) hours as needed for nausea or vomiting. 10/16/21   Evlyn Courier, PA-C  Salicylic Acid (SALICYLIC ACID WART REMOVER) 27.5 % LIQD Apply 1 drop of salicylic acid with pea sized amount of fluorouracil to the top of the wart only daily using q-tip. Allow to dry and cover with bandage. 04/15/22   Orma Render, NP  triamcinolone cream (KENALOG) 0.1 % Apply 1 application. topically 2 (two) times daily. To affected area(s) as needed 10/25/21  Tollie Eth, NP  valACYclovir (VALTREX) 1000 MG tablet Take 1 tablet (1,000 mg total) by mouth daily. 05/13/22   Tollie Eth, NP  Vitamins A & D (VITAMIN A & D) ointment Apply 1 application. topically as needed for dry skin. Apply twice a day to rectum and vaginal area with nystatin cream. 10/25/21   Early, Sung Amabile, NP      Allergies    Ceftriaxone, Methocarbamol, and Sulfa antibiotics    Review of Systems   Review of Systems  Physical Exam Updated Vital Signs BP (!) 141/99 (BP Location: Right Arm)   Pulse 84   Temp 97.7 F (36.5 C) (Oral)   Resp 14   SpO2 99%  Physical Exam Vitals and nursing note reviewed.  Constitutional:       General: She is not in acute distress.    Appearance: Normal appearance. She is not ill-appearing.  HENT:     Head: Normocephalic and atraumatic.     Mouth/Throat:     Comments: Posterior pharynx with erythema, tonsils are symmetric with exudates approximately 1+, uvula midline, normal phonation, anterior cervical lymphadenopathy with tenderness Eyes:     General: No scleral icterus.       Right eye: No discharge.        Left eye: No discharge.     Conjunctiva/sclera: Conjunctivae normal.  Pulmonary:     Effort: Pulmonary effort is normal.     Breath sounds: No stridor.  Neurological:     Mental Status: She is alert and oriented to person, place, and time. Mental status is at baseline.     ED Results / Procedures / Treatments   Labs (all labs ordered are listed, but only abnormal results are displayed) Labs Reviewed - No data to display  EKG None  Radiology No results found.  Procedures Procedures    Medications Ordered in ED Medications - No data to display  ED Course/ Medical Decision Making/ A&P                           Medical Decision Making  Patient is a 42 year old female with a sore throat for 4 days now.  No cough congestion sinus pressure no nausea vomiting or diarrhea.  She has recently been exposed to strep throat.  She denies any fevers at home.   Physical exam with tender anterior cervical chain lymphadenopathy.  Patient has elevated Centor's criteria score.  We did share decision-making oversedation and I provided her with a wait-and-see azithromycin prescription.  Also prescribed her Magic mouthwash   Final Clinical Impression(s) / ED Diagnoses Final diagnoses:  Acute pharyngitis, unspecified etiology    Rx / DC Orders ED Discharge Orders          Ordered    azithromycin (ZITHROMAX) 250 MG tablet  Daily        07/07/22 0041    magic mouthwash (lidocaine, diphenhydrAMINE, alum & mag hydroxide) suspension  4 times daily PRN         07/07/22 0041              Gailen Shelter, PA 07/07/22 0122    Shon Baton, MD 07/07/22 [phone]

## 2022-07-18 ENCOUNTER — Ambulatory Visit (HOSPITAL_COMMUNITY)
Admission: EM | Admit: 2022-07-18 | Discharge: 2022-07-18 | Disposition: A | Discharge status: Home / Self Care | Payer: Commercial Managed Care - HMO | Attending: Nurse Practitioner | Admitting: Nurse Practitioner

## 2022-07-18 ENCOUNTER — Encounter (HOSPITAL_COMMUNITY): Payer: Self-pay

## 2022-07-18 DIAGNOSIS — G43009 Migraine without aura, not intractable, without status migrainosus: Secondary | ICD-10-CM

## 2022-07-18 MED ORDER — KETOROLAC TROMETHAMINE 60 MG/2ML IM SOLN
INTRAMUSCULAR | Status: AC
  Filled 2022-07-18: qty 2

## 2022-07-18 MED ORDER — KETOROLAC TROMETHAMINE 60 MG/2ML IM SOLN
60.0000 mg | Freq: Once | INTRAMUSCULAR | Status: AC
  Administered 2022-07-18: 60 mg via INTRAMUSCULAR

## 2022-07-18 NOTE — Discharge Instructions (Addendum)
You were given a Toradol 60 mg IM injection for your pain. The recommendation is for you to go to the ER to be further evaluated due theses headaches to be stronger and different.

## 2022-07-18 NOTE — ED Provider Notes (Signed)
[facility]    CSN: [id number] Arrival date & time: 07/18/22  1853      History   Chief Complaint Chief Complaint  Patient presents with   Migraine    HPI Monica Hunter is a 42 y.o. female.   HPI  She is in today for migraine that will not improve. She reports this is the third one over the last few days. She has had vomiting. She describes it as the worse headache ever. She is concern of the cause. She is having visual changes. She feels like the headache is all over at times. Denies fever, chills, polydipsia,  polyphagia shortness of breath, dyspnea on exertion, chest pain, nausea, vomiting, polyuria, constipation, diarrhea, or any edema. She reports that she does get sick a lot with her DM.   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 No pertinent surgical history.  OB History     Gravida  0   Para  0   Term  0   Preterm  0   AB  0   Living  0      SAB  0   IAB  0   Ectopic  0   Multiple  0   Live Births  0            Home Medications    Prior to Admission medications   Medication Sig Start Date End Date Taking? Authorizing Provider  albuterol (VENTOLIN HFA) 108 (90 Base) MCG/ACT inhaler Inhale 1-2 puffs into the lungs every 4 (four) hours as needed. For  shortness of breath 05/13/22   Early, Coralee Pesa, NP  Continuous Blood Gluc Receiver (DEXCOM G7 RECEIVER) DEVI Use to check BG 12/03/21   Early, Coralee Pesa, NP  Continuous Blood Gluc Sensor (DEXCOM G7 SENSOR) MISC Apply one sensor to the abdomen every 10 days. 03/15/22   Orma Render, NP  doxycycline (VIBRAMYCIN) 100 MG capsule Take 1 capsule (100 mg total) by mouth 2 (two) times daily. 06/17/22   Leftwich-Kirby, Kathie Dike, CNM  Dulaglutide (TRULICITY) 1.5 FU/9.3AT SOPN Inject 1.5 mg into the skin once a week. 05/13/22   Orma Render, NP  famotidine (PEPCID) 20 MG tablet Take 1 tablet (20 mg total) by mouth 2 (two) times daily. Trial this for 2 months for rash 05/13/22   Early, Coralee Pesa, NP  fluconazole (DIFLUCAN) 150 MG tablet Take one tablet every 3 days. 12/03/21   Orma Render, NP  fluoruracil [facility]) 0.5 % cream Apply topically daily. Apply 1 drop of salicylic acid with pea sized amount of fluorouracil to the top of the wart only daily using q-tip. Allow to dry and cover with bandage. 04/15/22   Orma Render, NP  hydrOXYzine (ATARAX) 25 MG tablet Take 1 tablet (25 mg total) by  mouth every 8 (eight) hours as needed. 05/13/22   Tollie Eth, NP  ibuprofen (ADVIL) 800 MG tablet Take by mouth. 12/03/15   [provider]  medroxyPROGESTERone (PROVERA) 10 MG tablet Take 1 tablet (10 mg total) by mouth daily. 06/17/22   Leftwich-Kirby, Wilmer Floor, CNM  montelukast (SINGULAIR) 10 MG tablet Take 1 tablet (10 mg total) by mouth at bedtime. 05/13/22   Tollie Eth, NP  nystatin ointment (MYCOSTATIN) Apply 1 application. topically 2 (two) times daily. 11/29/21   Early, Sung Amabile, NP  ondansetron (ZOFRAN-ODT) 4 MG disintegrating tablet Take 1 tablet (4 mg total) by mouth every 8 (eight) hours as needed for nausea or vomiting. 10/16/21   Marita Kansas, PA-C  Salicylic Acid (SALICYLIC ACID WART REMOVER) 27.5 % LIQD Apply 1 drop of salicylic acid with pea sized amount of fluorouracil to the top of the wart only daily using q-tip.  Allow to dry and cover with bandage. 04/15/22   Tollie Eth, NP  triamcinolone cream (KENALOG) 0.1 % Apply 1 application. topically 2 (two) times daily. To affected area(s) as needed 10/25/21   Early, Sung Amabile, NP  valACYclovir (VALTREX) 1000 MG tablet Take 1 tablet (1,000 mg total) by mouth daily. 05/13/22   Tollie Eth, NP  Vitamins A & D (VITAMIN A & D) ointment Apply 1 application. topically as needed for dry skin. Apply twice a day to rectum and vaginal area with nystatin cream. 10/25/21   Early, Sung Amabile, NP    Family History Family History  Problem Relation Age of Onset   Diabetes Mother    Diabetes Father     Social History Social History   Tobacco Use   Smoking status: Every Day   Smokeless tobacco: Never  Substance Use Topics   Alcohol use: No   Drug use: No     Allergies   Ceftriaxone, Methocarbamol, and Sulfa antibiotics   Review of Systems Review of Systems   Physical Exam Triage Vital Signs ED Triage Vitals  Enc Vitals Group     BP 07/18/22 1938 135/89     Pulse Rate 07/18/22 1938 65     Resp 07/18/22 1938 16     Temp 07/18/22 1938 98 F (36.7 C)     Temp Source 07/18/22 1938 Oral     SpO2 07/18/22 1938 94 %     Weight --      Height --      Head Circumference --      Peak Flow --      Pain Score 07/18/22 1940 10     Pain Loc --      Pain Edu? --      Excl. in GC? --    No data found.  Updated Vital Signs BP 135/89 (BP Location: Left Arm)   Pulse 65   Temp 98 F (36.7 C) (Oral)   Resp 16   SpO2 94%   Visual Acuity Right Eye Distance:   Left Eye Distance:   Bilateral Distance:    Right Eye Near:   Left Eye Near:    Bilateral Near:     Physical Exam Constitutional:      General: She is in acute distress.     Appearance: She is obese. She is not ill-appearing, toxic-appearing or diaphoretic.  HENT:     Head: Normocephalic and atraumatic.     Nose: Nose normal.     Mouth/Throat:     Mouth: Mucous membranes are moist.  Comments: Missing teeth Cardiovascular:     Rate and Rhythm: Normal rate.     Pulses: Normal pulses.  Pulmonary:     Effort: Pulmonary effort is normal.  Abdominal:     Comments: Obese  Musculoskeletal:        General: Normal range of motion.     Cervical back: Normal range of motion.  Skin:    General: Skin is warm and dry.     Capillary Refill: Capillary refill takes less than 2 seconds.  Neurological:     General: No focal deficit present.     Mental Status: She is alert and oriented to person, place, and time.      UC Treatments / Results  Labs (all labs ordered are listed, but only abnormal results are displayed) Labs Reviewed - No data to display  EKG   Radiology No results found.  Procedures Procedures (including critical care time)  Medications Ordered in UC Medications  ketorolac (TORADOL) injection 60 mg (60 mg Intramuscular Given 07/18/22 2035)    Initial Impression / Assessment and Plan / UC Course  I have reviewed the triage vital signs and the nursing notes.  Pertinent labs & imaging results that were available during my care of the patient were reviewed by me and considered in my medical decision making (see chart for details).  Declined work excuse    headache Final Clinical Impressions(s) / UC Diagnoses   Final diagnoses:  Migraine without aura and without status migrainosus, not intractable     Discharge Instructions      You were given a Toradol 60 mg IM injection for your pain. The recommendation is for you to go to the ER to be further evaluated due theses headaches to be stronger and different.       ED Prescriptions   None    PDMP not reviewed this encounter.   Dionisio David [place], [place] 07/18/22 2052

## 2022-07-18 NOTE — ED Triage Notes (Signed)
Pt states headache to the front of her head for about an hour. States she took motrin but then she vomited.

## 2022-07-22 ENCOUNTER — Emergency Department (HOSPITAL_COMMUNITY)
Admission: EM | Admit: 2022-07-22 | Discharge: 2022-07-22 | Disposition: A | Discharge status: Home / Self Care | Payer: Commercial Managed Care - HMO | Attending: Emergency Medicine | Admitting: Emergency Medicine

## 2022-07-22 ENCOUNTER — Encounter (HOSPITAL_COMMUNITY): Payer: Self-pay | Admitting: Emergency Medicine

## 2022-07-22 DIAGNOSIS — H5712 Ocular pain, left eye: Secondary | ICD-10-CM | POA: Diagnosis not present

## 2022-07-22 DIAGNOSIS — J45909 Unspecified asthma, uncomplicated: Secondary | ICD-10-CM | POA: Insufficient documentation

## 2022-07-22 DIAGNOSIS — E119 Type 2 diabetes mellitus without complications: Secondary | ICD-10-CM | POA: Insufficient documentation

## 2022-07-22 DIAGNOSIS — F17200 Nicotine dependence, unspecified, uncomplicated: Secondary | ICD-10-CM | POA: Insufficient documentation

## 2022-07-22 DIAGNOSIS — Z7951 Long term (current) use of inhaled steroids: Secondary | ICD-10-CM | POA: Insufficient documentation

## 2022-07-22 DIAGNOSIS — H538 Other visual disturbances: Secondary | ICD-10-CM | POA: Insufficient documentation

## 2022-07-22 DIAGNOSIS — R519 Headache, unspecified: Secondary | ICD-10-CM | POA: Insufficient documentation

## 2022-07-22 MED ORDER — ERYTHROMYCIN 5 MG/GM OP OINT
TOPICAL_OINTMENT | Freq: Four times a day (QID) | OPHTHALMIC | Status: DC
  Administered 2022-07-22: 1 via OPHTHALMIC
  Filled 2022-07-22: qty 3.5

## 2022-07-22 MED ORDER — HYDROCODONE-ACETAMINOPHEN 5-325 MG PO TABS: 1.0000 | ORAL_TABLET | Freq: Four times a day (QID) | ORAL | 0 refills | Status: DC | PRN

## 2022-07-22 MED ORDER — HYDROCODONE-ACETAMINOPHEN 5-325 MG PO TABS
1.0000 | ORAL_TABLET | Freq: Once | ORAL | Status: AC
  Administered 2022-07-22: 1 via ORAL
  Filled 2022-07-22: qty 1

## 2022-07-22 MED ORDER — TETRACAINE HCL 0.5 % OP SOLN
2.0000 [drp] | Freq: Once | OPHTHALMIC | Status: AC
  Administered 2022-07-22: 2 [drp] via OPHTHALMIC
  Filled 2022-07-22: qty 4

## 2022-07-22 MED ORDER — FLUORESCEIN SODIUM 1 MG OP STRP
1.0000 | ORAL_STRIP | Freq: Once | OPHTHALMIC | Status: AC
  Administered 2022-07-22: 1 via OPHTHALMIC
  Filled 2022-07-22: qty 1

## 2022-07-22 NOTE — Discharge Instructions (Signed)
Make an appointment to see Dr. Stacie Glaze today.  It is important that you have a thorough eye examination as we only have limited abilities to evaluate eye disease in the emergency department.

## 2022-07-22 NOTE — ED Triage Notes (Signed)
Pt here with c/o pain and redness to her left eye feels like something is in it and hurts to blink

## 2022-07-22 NOTE — ED Provider Notes (Signed)
[facility] DEPT Provider Note: Georgena Spurling, MD, FACEP  CSN: [id number] MRN: [id number] ARRIVAL: 07/22/22 at [place]: [facility]   HISTORY OF PRESENT ILLNESS  07/22/22 2:05 AM Monica Hunter is a 42 y.o. female in a migraine headache 2 days ago that was so severe she was "beating myself on the head".  She awakened with pain and blurred vision in her left eye yesterday.  She is not sure if she injured her left eye.  She rates the pain as a 5 out of 10, worse with exposure to light.   Past Medical History:  Diagnosis Date   Asthma    Diabetes mellitus    Morbid obesity ([place])     History reviewed. No pertinent surgical history.  Family History  Problem Relation Age of Onset   Diabetes Mother    Diabetes Father     Social History   Tobacco Use   Smoking status: Every Day   Smokeless tobacco: Never  Substance Use Topics   Alcohol use: No   Drug use: No    Prior to Admission medications   Medication Sig Start Date End Date Taking? Authorizing Provider  HYDROcodone-acetaminophen (NORCO) 5-325 MG tablet Take 1-2 tablets by mouth every 6 (six) hours as needed for severe pain. 07/22/22  Yes Annastyn Silvey, MD  albuterol (VENTOLIN HFA) 108 (90 Base) MCG/ACT inhaler Inhale 1-2 puffs into the lungs every 4 (four) hours as needed. For shortness of breath 05/13/22   Early, Coralee Pesa, NP  Continuous Blood Gluc Receiver (DEXCOM G7 RECEIVER) DEVI Use to check BG 12/03/21   Early, Coralee Pesa, NP  Continuous Blood Gluc Sensor (DEXCOM G7 SENSOR) MISC Apply one sensor to the abdomen every 10 days. 03/15/22   Orma Render, NP  doxycycline (VIBRAMYCIN) 100 MG capsule Take 1 capsule (100 mg total) by mouth 2 (two) times daily. 06/17/22   Leftwich-Kirby, Kathie Dike, CNM  Dulaglutide (TRULICITY) 1.5 OJ/5.0KX SOPN Inject 1.5 mg into the skin once a week. 05/13/22   Orma Render, NP  famotidine (PEPCID) 20 MG tablet Take 1 tablet (20 mg total) by mouth 2 (two) times  daily. Trial this for 2 months for rash 05/13/22   Early, Coralee Pesa, NP  fluconazole (DIFLUCAN) 150 MG tablet Take one tablet every 3 days. 12/03/21   Orma Render, NP  fluoruracil [facility]) 0.5 % cream Apply topically daily. Apply 1 drop of salicylic acid with pea sized amount of fluorouracil to the top of the wart only daily using q-tip. Allow to dry and cover with bandage. 04/15/22   Orma Render, NP  hydrOXYzine (ATARAX) 25 MG tablet Take 1 tablet (25 mg total) by mouth every 8 (eight) hours as needed. 05/13/22   Orma Render, NP  ibuprofen (ADVIL) 800 MG tablet Take by mouth. 12/03/15   [provider]  medroxyPROGESTERone (PROVERA) 10 MG tablet Take 1 tablet (10 mg total) by mouth daily. 06/17/22   Leftwich-Kirby, Kathie Dike, CNM  montelukast (SINGULAIR) 10 MG tablet Take 1 tablet (10 mg total) by mouth at bedtime. 05/13/22   Orma Render, NP  nystatin ointment (MYCOSTATIN) Apply 1 application. topically 2 (two) times daily. 11/29/21   Early, Coralee Pesa, NP  ondansetron (ZOFRAN-ODT) 4 MG disintegrating tablet Take 1 tablet (4 mg total) by mouth every 8 (eight) hours as needed for nausea or vomiting. 10/16/21   Evlyn Courier, PA-C  Salicylic Acid (SALICYLIC ACID WART REMOVER) 27.5 % LIQD  Apply 1 drop of salicylic acid with pea sized amount of fluorouracil to the top of the wart only daily using q-tip. Allow to dry and cover with bandage. 04/15/22   Orma Render, NP  triamcinolone cream (KENALOG) 0.1 % Apply 1 application. topically 2 (two) times daily. To affected area(s) as needed 10/25/21   Early, Coralee Pesa, NP  valACYclovir (VALTREX) 1000 MG tablet Take 1 tablet (1,000 mg total) by mouth daily. 05/13/22   Orma Render, NP  Vitamins A & D (VITAMIN A & D) ointment Apply 1 application. topically as needed for dry skin. Apply twice a day to rectum and vaginal area with nystatin cream. 10/25/21   Early, Coralee Pesa, NP    Allergies Ceftriaxone, Methocarbamol, and Sulfa antibiotics   REVIEW OF SYSTEMS  Negative  except as noted here or in the History of Present Illness.   PHYSICAL EXAMINATION  Initial Vital Signs Blood pressure (!) 183/101, pulse 84, temperature 98.2 F (36.8 C), temperature source Oral, resp. rate 18, height 5\' 8"  (1.727 m), weight (!) 165.6 kg, SpO2 100 %.  Examination General: Well-developed, well-nourished female in no acute distress; appearance consistent with age of record HENT: normocephalic; atraumatic Eyes: pupils equal, round and reactive to light; extraocular muscles intact; left conjunctival injection; relief of eye pain with tetracaine drops; no fluorescein uptake seen; IOP left 22 Neck: supple Heart: regular rate and rhythm Lungs: clear to auscultation bilaterally Abdomen: soft; nondistended; nontender; bowel sounds present Extremities: No deformity; full range of motion Neurologic: Awake, alert and oriented; motor function intact in all extremities and symmetric; no facial droop Skin: Warm and dry Psychiatric: Normal mood and affect   RESULTS  Summary of this visit's results, reviewed and interpreted by myself:   EKG Interpretation  Date/Time:    Ventricular Rate:    PR Interval:    QRS Duration:   QT Interval:    QTC Calculation:   R Axis:     Text Interpretation:         Laboratory Studies: No results found for this or any previous visit (from the past 24 hour(s)). Imaging Studies: No results found.  ED COURSE and MDM  Nursing notes, initial and subsequent vitals signs, including pulse oximetry, reviewed and interpreted by myself.  Vitals:   07/22/22 0154  BP: (!) 183/101  Pulse: 84  Resp: 18  Temp: 98.2 F (36.8 C)  TempSrc: Oral  SpO2: 100%  Weight: (!) 165.6 kg  Height: 5\' 8"  (1.727 m)   Medications  HYDROcodone-acetaminophen (NORCO/VICODIN) 5-325 MG per tablet 1 tablet (has no administration in time range)  erythromycin ophthalmic ointment (has no administration in time range)  tetracaine (PONTOCAINE) 0.5 % ophthalmic  solution 2 drop (2 drops Left Eye Given by Other 07/22/22 0228)  fluorescein ophthalmic strip 1 strip (1 strip Left Eye Given by Other 07/22/22 0229)   The patient's eye pain was relieved with tetracaine drops.  This suggests a superficial injury but no corneal abrasion was seen on fluorescein instillation.  No foreign body was seen.  We will treat with topical erythromycin ointment and hydrocodone and refer to ophthalmology later this morning.  Her intraocular pressure was slightly elevated and it is hard to tell if this is artifactual because it was a difficult examination she was advised that it is important she follow-up with ophthalmology as she needs a thorough eye examination for both her acute pain as well as the possibility of glaucoma.   PROCEDURES  Procedures   ED  DIAGNOSES     ICD-10-CM   1. Pain of left eye  H57.12          Roniqua Kintz, Jonny Ruiz, MD 07/22/22 [phone]

## 2022-07-24 ENCOUNTER — Ambulatory Visit (INDEPENDENT_AMBULATORY_CARE_PROVIDER_SITE_OTHER): Payer: Commercial Managed Care - HMO | Admitting: Allergy

## 2022-07-24 ENCOUNTER — Encounter: Payer: Self-pay | Admitting: Allergy

## 2022-07-24 VITALS — BP 126/68 | HR 80 | Temp 98.3°F | Resp 16 | Ht 68.0 in | Wt 365.0 lb

## 2022-07-24 DIAGNOSIS — L508 Other urticaria: Secondary | ICD-10-CM

## 2022-07-24 DIAGNOSIS — L299 Pruritus, unspecified: Secondary | ICD-10-CM

## 2022-07-24 DIAGNOSIS — J4540 Moderate persistent asthma, uncomplicated: Secondary | ICD-10-CM | POA: Diagnosis not present

## 2022-07-24 DIAGNOSIS — J310 Chronic rhinitis: Secondary | ICD-10-CM

## 2022-07-24 DIAGNOSIS — H109 Unspecified conjunctivitis: Secondary | ICD-10-CM

## 2022-07-24 DIAGNOSIS — H1013 Acute atopic conjunctivitis, bilateral: Secondary | ICD-10-CM

## 2022-07-24 MED ORDER — OLOPATADINE HCL 0.2 % OP SOLN: 1.0000 [drp] | Freq: Every day | OPHTHALMIC | 2 refills | Status: DC | PRN

## 2022-07-24 MED ORDER — FAMOTIDINE 20 MG PO TABS: 20.0000 mg | ORAL_TABLET | Freq: Two times a day (BID) | ORAL | 2 refills | Status: DC

## 2022-07-24 MED ORDER — ALBUTEROL SULFATE (2.5 MG/3ML) 0.083% IN NEBU: 2.5000 mg | INHALATION_SOLUTION | RESPIRATORY_TRACT | 1 refills | Status: DC | PRN

## 2022-07-24 MED ORDER — AEROCHAMBER PLUS FLO-VU MISC: 1 refills | Status: AC

## 2022-07-24 MED ORDER — MONTELUKAST SODIUM 10 MG PO TABS: 10.0000 mg | ORAL_TABLET | Freq: Every day | ORAL | 2 refills | Status: DC

## 2022-07-24 MED ORDER — BUDESONIDE-FORMOTEROL FUMARATE 160-4.5 MCG/ACT IN AERO: 2.0000 | INHALATION_SPRAY | Freq: Two times a day (BID) | RESPIRATORY_TRACT | 2 refills | Status: DC

## 2022-07-24 MED ORDER — RYALTRIS 665-25 MCG/ACT NA SUSP: NASAL | 2 refills | Status: DC

## 2022-07-24 MED ORDER — ALBUTEROL SULFATE HFA 108 (90 BASE) MCG/ACT IN AERS: 2.0000 | INHALATION_SPRAY | Freq: Four times a day (QID) | RESPIRATORY_TRACT | 1 refills | Status: DC | PRN

## 2022-07-24 NOTE — Patient Instructions (Addendum)
-  at this time etiology of hives and itching is unknown.  Hives can be caused by a variety of different triggers including illness/infection, foods, medications, stings, exercise, pressure, vibrations, extremes of temperature to name a few however majority of the time there is no identifiable trigger.  Your symptoms have been ongoing for >6 weeks making this chronic thus will obtain labwork to evaluate: tryptase, hive panel, environmental panel, alpha-gal panel, inflammatory markers - for hive control recommend you start Allegra 180mg  1 tab twice a day with Pepcid 20mg  1 tab twice a day.  Keep your Singulair daily in evening.  - will consider Xolair monthly injections if above regimen is not effective enough.  Xolair information provided.  - can use your hydroxyzine as needed for breakthrough symptoms  - Daily controller medication(s): Singulair 10mg  daily and Symbicort 160/4.26mcg two puffs twice daily with spacer - Prior to physical activity: albuterol 2 puffs 10-15 minutes before physical activity. - Rescue medications: albuterol 2 puffs or albuterol 1 vial via nebulizer every 4-6 hours as needed - Nebulizer provided today  - Asthma control goals:  * Full participation in all desired activities (may need albuterol before activity) * Albuterol use two time or less a week on average (not counting use with activity) * Cough interfering with sleep two time or less a month * Oral steroids no more than once a year * No hospitalizations  - as above obtaining environmental allergy panel - use Allegra as above for generalized allergy symptoms - for itchy/watery eyes use Pataday 1 drop each eye daily as needed - for nasal congestion/drainage can use Ryaltris 2 sprays each nostril twice a day for congestion or nasal drainage.  Sample provided.    Follow-up in 3 months or sooner if needed

## 2022-07-24 NOTE — Progress Notes (Signed)
New Patient Note  RE: Monica Hunter MRN: [id number] DOB: [birth year] Date of Office Visit: 07/24/2022  Primary care provider: Tollie Eth, NP  Chief Complaint: itching  History of present illness: Monica Hunter is a 42 y.o. female presenting today for evaluation of urticaria and itching.   About 7-8 years ago she had IV rocephin and states she had an allergic reaction to this.  She states since then she has had issues with recurrent hives and itching.  She states the hives and itching recurs every couple days.  She states when she showers with hot water she automatically start itching and will have hives.  She states if her skin is dry the itch is worse.  These are the only triggers she has identified.  She describes the hives as "small bumps that pop up that join together to make a big welt" and sometimes it can be a blotchy flat areas.  If she takes hydroxyzine she gets instant relief.  She does state the hives can burn if she doesn't take the hydroxyzine.  Does not leave bruising if not scratching or breaking the skin. No associated swelling. No joint aches/pains.  No foods make it worse.  No concern for medication making symptoms worse.  No concern for stings/bites.  She tries to avoid scented products.   She uses hydroxyzine and states it does help and does take 1 tab most days.  She has had not it in 3 days however and states her eyes are irritated,  she is itching, she has had migraines and states has had the hives.   She has a neurology appointment later today for the migraines.  She has taken pepcid for reflux as needed before.  She is also on singulair nightly for asthma.  She uses albuterol daily for wheezing.  She states she had recurring bronchitis for years but states has improved.  She states she is currently shortnes of breath as she is congested with her allergy symptoms which include nasal congestion, headaches, itch/watery eyes, sneezing.  She does not take any other  antihistamines besides hydroxyzine.  Not using any nasal sprays or eye drops.   Review of systems: Review of Systems  Constitutional: Negative.   HENT:         See HPI  Eyes:        See HPI  Respiratory:         See HPI  Cardiovascular: Negative.   Gastrointestinal: Negative.   Musculoskeletal: Negative.   Skin:        See HPI  Allergic/Immunologic: Negative.   Neurological: Negative.     All other systems negative unless noted above in HPI  Past medical history: Past Medical History:  Diagnosis Date   Asthma    Cornea abrasion    Diabetes mellitus    Migraines    Morbid obesity (HCC)     Past surgical history: Past Surgical History:  Procedure Laterality Date   CERVICAL POLYPECTOMY     DILATION AND CURETTAGE OF UTERUS      Family history:  Family History  Problem Relation Age of Onset   Allergic rhinitis Mother    Diabetes Mother    Sinusitis Mother    Sinusitis Father    Allergic rhinitis Father    Diabetes Father    Sinusitis Sister    Allergic rhinitis Sister    Sinusitis Brother    Allergic rhinitis Brother    Sinusitis Brother    Asthma Brother  Allergic rhinitis Brother    Asthma Maternal Aunt    COPD Maternal Aunt    Immunodeficiency Neg Hx    Eczema Neg Hx    Angioedema Neg Hx    Atopy Neg Hx    Urticaria Neg Hx     Social history: Lives in a hotel without carpeting with electric heating and central cooling.  Dog in the dwelling.  There are dogs and cats outside.  No concern for water damage, mildew or roaches in the dwelling.  Does smoke cigarettes 1/2 pack/day for past 24 years.   Medication List: Current Outpatient Medications  Medication Sig Dispense Refill   albuterol (VENTOLIN HFA) 108 (90 Base) MCG/ACT inhaler Inhale 1-2 puffs into the lungs every 4 (four) hours as needed. For shortness of breath 18 g 6   Continuous Blood Gluc Receiver (DEXCOM G7 RECEIVER) DEVI Use to check BG 1 each 0   Continuous Blood Gluc Sensor (DEXCOM G7  SENSOR) MISC Apply one sensor to the abdomen every 10 days. 3 each 11   Dulaglutide (TRULICITY) 1.5 SW/5.4OE SOPN Inject 1.5 mg into the skin once a week. 2 mL 1   famotidine (PEPCID) 20 MG tablet Take 1 tablet (20 mg total) by mouth 2 (two) times daily. Trial this for 2 months for rash 60 tablet 2   fluconazole (DIFLUCAN) 150 MG tablet Take one tablet every 3 days. 6 tablet 2   fluoruracil (CARAC) 0.5 % cream Apply topically daily. Apply 1 drop of salicylic acid with pea sized amount of fluorouracil to the top of the wart only daily using q-tip. Allow to dry and cover with bandage. 30 g 0   HYDROcodone-acetaminophen (NORCO) 5-325 MG tablet Take 1-2 tablets by mouth every 6 (six) hours as needed for severe pain. 8 tablet 0   hydrOXYzine (ATARAX) 25 MG tablet Take 1 tablet (25 mg total) by mouth every 8 (eight) hours as needed. 90 tablet 3   medroxyPROGESTERone (PROVERA) 10 MG tablet Take 1 tablet (10 mg total) by mouth daily. 10 tablet 10   montelukast (SINGULAIR) 10 MG tablet Take 1 tablet (10 mg total) by mouth at bedtime. 90 tablet 3   nystatin ointment (MYCOSTATIN) Apply 1 application. topically 2 (two) times daily. 30 g 6   ondansetron (ZOFRAN-ODT) 4 MG disintegrating tablet Take 1 tablet (4 mg total) by mouth every 8 (eight) hours as needed for nausea or vomiting. 20 tablet 0   Salicylic Acid (SALICYLIC ACID WART REMOVER) 27.5 % LIQD Apply 1 drop of salicylic acid with pea sized amount of fluorouracil to the top of the wart only daily using q-tip. Allow to dry and cover with bandage. 10 mL 1   triamcinolone cream (KENALOG) 0.1 % Apply 1 application. topically 2 (two) times daily. To affected area(s) as needed 30 g 3   valACYclovir (VALTREX) 1000 MG tablet Take 1 tablet (1,000 mg total) by mouth daily. 30 tablet 6   Vitamins A & D (VITAMIN A & D) ointment Apply 1 application. topically as needed for dry skin. Apply twice a day to rectum and vaginal area with nystatin cream. 45 g 3   No current  facility-administered medications for this visit.    Known medication allergies: Allergies  Allergen Reactions   Ceftriaxone Hives and Itching   Methocarbamol Itching and Rash   Sulfa Antibiotics Other (See Comments), Hives, Itching and Rash    Reaction unknown     Physical examination: Blood pressure 126/68, pulse 80, temperature 98.3 F (36.8 C),  temperature source Temporal, resp. rate 16, height 5\' 8"  (1.727 m), weight (!) 365 lb (165.6 kg), SpO2 97 %.  General: Alert, interactive, in no acute distress. HEENT: PERRLA, TMs pearly gray, turbinates mildly edematous without discharge, post-pharynx non erythematous. Neck: Supple without lymphadenopathy. Lungs: Clear to auscultation without wheezing, rhonchi or rales. {no increased work of breathing. CV: Normal S1, S2 without murmurs. Abdomen: Nondistended, nontender. Skin: Warm and dry, without lesions or rashes. Extremities:  No clubbing, cyanosis or edema. Neuro:   Grossly intact.  Diagnositics/Labs: Labs:  Sodium 135 - 146 MMOL/L 139  Potassium 3.5 - 5.3 MMOL/L 4.0  Chloride 98 - 110 MMOL/L 106  CO2 23 - 30 MMOL/L 23  BUN 8 - 24 MG/DL 15  Glucose 70 - 99 MG/DL High   Comment: Patients taking eltrombopag at doses >/= 100 mg daily may show falsely elevated values of 10% or greater.  Creatinine 0.50 - 1.50 MG/DL 854  Calcium 8.5 - 6.27 MG/DL 9.1  Total Protein 6.0 - 8.3 G/DL 7.7  Comment: Patients taking eltrombopag at doses >/= 100 mg daily may show falsely elevated values of 10% or greater.  Albumin 3.5 - 5.0 G/DL 3.9  Total Bilirubin 0.1 - 1.2 MG/DL 0.3  Comment: Patients taking eltrombopag at doses >/= 100 mg daily may show falsely elevated values of 10% or greater.  Alkaline Phosphatase 25 - 125 IU/L or U/L 75  AST (SGOT) 5 - 40 IU/L or U/L 17  ALT (SGPT) 5 - 50 IU/L or U/L 19  Anion Gap 4 - 14 MMOL/L 10  Est. GFR >=60 ML/MIN/1.73 M*2 >90   WBC 4.4 - 11.0 x 10*3/uL 7.1  RBC 4.10 - 5.10 x 10*6/uL 4.60   Hemoglobin 12.3 - 15.3 G/DL 03.5  Hematocrit 00.9 - 44.6 % 41.9  MCV 80.0 - 96.0 FL 91.1  MCH 27.5 - 33.2 PG 30.6  MCHC 33.0 - 37.0 G/DL 38.1  RDW 82.9 - 93.7 % 13.9  Platelets 150 - 450 X 10*3/uL 261  MPV 6.8 - 10.2 FL 7.5  Neutrophil % % 65  Lymphocyte % % 27  Monocyte % % 6  Eosinophil % % 1  Basophil % % 1  Neutrophil Absolute 1.8 - 7.8 x 10*3/uL 4.6  Lymphocyte Absolute 1.0 - 4.8 x 10*3/uL 1.9  Monocyte Absolute 0.0 - 0.8 x 10*3/uL 0.4  Eosinophil Absolute 0.0 - 0.5 x 10*3/uL 0.1  Basophil Absolute 0.0 - 0.2 x 10*3/uL 0.1   Component     Latest Ref Rng 05/13/2022  TSH     0.450 - 4.500 uIU/mL 0.867     Spirometry: FEV1: 1.7L 57%, FVC: 2.45L 68% predicted.  S/p xopenex neb she had a 25% increase in FEV1 to 2.13L 72% which is significant change.  Allergy testing: deferred due to ongoing urticaria  Assessment and plan: Chronic urticaria with angioedema  - at this time etiology of hives and itching is unknown.  Hives can be caused by a variety of different triggers including illness/infection, foods, medications, stings, exercise, pressure, vibrations, extremes of temperature to name a few however majority of the time there is no identifiable trigger.  Your symptoms have been ongoing for >6 weeks making this chronic thus will obtain labwork to evaluate: tryptase, hive panel, environmental panel, alpha-gal panel, inflammatory markers - for hive control recommend you start Allegra 180mg  1 tab twice a day with Pepcid 20mg  1 tab twice a day.  Keep your Singulair daily in evening.  - will consider Xolair monthly  injections if above regimen is not effective enough.  Xolair information provided.  - can use your hydroxyzine as needed for breakthrough symptoms  Moderate persistent asthma - not well controlled - Daily controller medication(s): Singulair 10mg  daily and Symbicort 160/4.53mcg two puffs twice daily with spacer - Prior to physical activity: albuterol 2 puffs 10-15 minutes  before physical activity. - Rescue medications: albuterol 2 puffs or albuterol 1 vial via nebulizer every 4-6 hours as needed - Nebulizer provided today  - Asthma control goals:  * Full participation in all desired activities (may need albuterol before activity) * Albuterol use two time or less a week on average (not counting use with activity) * Cough interfering with sleep two time or less a month * Oral steroids no more than once a year * No hospitalizations  Rhinoconjunctivitis - as above obtaining environmental allergy panel - use Allegra as above for generalized allergy symptoms - for itchy/watery eyes use Pataday 1 drop each eye daily as needed - for nasal congestion/drainage can use Ryaltris 2 sprays each nostril twice a day for congestion or nasal drainage.  Sample provided.    Follow-up in 3 months or sooner if needed  I appreciate the opportunity to take part in Monica Hunter's care. Please do not hesitate to contact me with questions.  Sincerely,   Prudy Feeler, MD Allergy/Immunology [facility] -----------------------------------  Addendum - attempted to do labwork however pt has a balance already with [facility] thus labs not obtained today

## 2022-08-05 ENCOUNTER — Ambulatory Visit (INDEPENDENT_AMBULATORY_CARE_PROVIDER_SITE_OTHER): Payer: Commercial Managed Care - HMO | Admitting: Internal Medicine

## 2022-08-05 ENCOUNTER — Encounter: Payer: Self-pay | Admitting: Internal Medicine

## 2022-08-05 VITALS — Ht 68.0 in | Wt 370.0 lb

## 2022-08-05 DIAGNOSIS — Z794 Long term (current) use of insulin: Secondary | ICD-10-CM

## 2022-08-05 DIAGNOSIS — E1165 Type 2 diabetes mellitus with hyperglycemia: Secondary | ICD-10-CM | POA: Diagnosis not present

## 2022-08-05 LAB — POCT GLYCOSYLATED HEMOGLOBIN (HGB A1C): Hemoglobin A1C: 7.5 % — AB (ref 4.0–5.6)

## 2022-08-05 MED ORDER — TRULICITY 1.5 MG/0.5ML ~~LOC~~ SOAJ: 1.5000 mg | SUBCUTANEOUS | 3 refills | Status: DC

## 2022-08-05 NOTE — Progress Notes (Signed)
Name: Celine Dishman  MRN/ DOB: [id number], May 03, 1981   Age/ Sex: 42 y.o., female    PCP: Early, Coralee Pesa, NP   Reason for Endocrinology Evaluation: Type 2 Diabetes Mellitus     Date of Initial Endocrinology Visit: 08/05/2022     PATIENT IDENTIFIER: Ms. Catheline Hixon is a 42 y.o. female with a past medical history of DM and Adthma . The patient presented for initial endocrinology clinic visit on 08/05/2022 for consultative assistance with her diabetes management.    HPI: Ms. Woodburn was    Diagnosed with DM years  Prior Medications tried/Intolerance: Metformin-diarrhea , Ozempic - vomiting  Currently checking blood sugars multiple x / day, through dexcom  Hypoglycemia episodes :no            Hemoglobin A1c has ranged from 6.9% in 2023, peaking at 14.2% in 2023.  Has hx of DKA's in the past , last admissionc 09/2021  In terms of diet, the patient eats 2 meals a day.   Has been out of trulicity for 3 months   Has migraine headaches that are worseing in the past 3 weeks, associated with dizziness  Three weeks ago she had diarrhea for 2 days but has resolved  She is on glucocorticoids for headaches  Has overactive bladder with incontinence   HOME DIABETES REGIMEN: Trulicity 1.5 mg weekly - not taking    Statin: no ACE-I/ARB: no   CONTINUOUS GLUCOSE MONITORING RECORD INTERPRETATION    Dates of Recording: 1/23-08/05/2021  Sensor description:dexcom   Results statistics:   CGM use % of time 21  Average and SD 183/34  Time in range     52   %  % Time Above 180 44  % Time above 250 4  % Time Below target 0   Glycemic patterns summary: Bgs high at night and fluctuate during the day   Hyperglycemic episodes  post prandial   Hypoglycemic episodes occurred n/a  Overnight periods: stable     DIABETIC COMPLICATIONS: Microvascular complications:   Denies: CKD, retinopathic  Last eye exam: Completed 2023  Macrovascular complications:   Denies: CAD, PVD,  CVA   PAST HISTORY: Past Medical History:  Past Medical History:  Diagnosis Date   Asthma    Cornea abrasion    Diabetes mellitus    Migraines    Morbid obesity ([place])    Past Surgical History:  Past Surgical History:  Procedure Laterality Date   CERVICAL POLYPECTOMY     DILATION AND CURETTAGE OF UTERUS      Social History:  reports that she has been smoking cigarettes. She has been smoking an average of .5 packs per day. She has been exposed to tobacco smoke. She has never used smokeless tobacco. She reports that she does not drink alcohol and does not use drugs. Family History:  Family History  Problem Relation Age of Onset   Allergic rhinitis Mother    Diabetes Mother    Sinusitis Mother    Sinusitis Father    Allergic rhinitis Father    Diabetes Father    Sinusitis Sister    Allergic rhinitis Sister    Sinusitis Brother    Allergic rhinitis Brother    Sinusitis Brother    Asthma Brother    Allergic rhinitis Brother    Asthma Maternal Aunt    COPD Maternal Aunt    Immunodeficiency Neg Hx    Eczema Neg Hx    Angioedema Neg Hx    Atopy Neg Hx  Urticaria Neg Hx      HOME MEDICATIONS: Allergies as of 08/05/2022       Reactions   Ceftriaxone Hives, Itching   Methocarbamol Itching, Rash   Sulfa Antibiotics Other (See Comments), Hives, Itching, Rash   Reaction unknown        Medication List        Accurate as of August 05, 2022  2:23 PM. If you have any questions, ask your nurse or doctor.          STOP taking these medications    budesonide-formoterol 160-4.5 MCG/ACT inhaler Commonly known as: Symbicort Stopped by: Dorita Sciara, MD   HYDROcodone-acetaminophen 5-325 MG tablet Commonly known as: Norco Stopped by: Dorita Sciara, MD   Olopatadine HCl 0.2 % Soln Commonly known as: Pataday Stopped by: Dorita Sciara, MD       TAKE these medications    aerochamber plus with mask inhaler Use as directed with Metered  Dose Inhaler   albuterol 108 (90 Base) MCG/ACT inhaler Commonly known as: VENTOLIN HFA Inhale 1-2 puffs into the lungs every 4 (four) hours as needed. For shortness of breath   albuterol 108 (90 Base) MCG/ACT inhaler Commonly known as: VENTOLIN HFA Inhale 2 puffs into the lungs every 6 (six) hours as needed for wheezing or shortness of breath.   albuterol (2.5 MG/3ML) 0.083% nebulizer solution Commonly known as: PROVENTIL Take 3 mLs (2.5 mg total) by nebulization every 4 (four) hours as needed for wheezing or shortness of breath.   Dexcom G7 Receiver Devi Use to check BG   Dexcom G7 Sensor Misc Apply one sensor to the abdomen every 10 days.   famotidine 20 MG tablet Commonly known as: PEPCID Take 1 tablet (20 mg total) by mouth 2 (two) times daily. Trial this for 2 months for rash   famotidine 20 MG tablet Commonly known as: Pepcid Take 1 tablet (20 mg total) by mouth 2 (two) times daily.   fluconazole 150 MG tablet Commonly known as: DIFLUCAN Take one tablet every 3 days.   fluoruracil 0.5 % cream Commonly known as: CARAC Apply topically daily. Apply 1 drop of salicylic acid with pea sized amount of fluorouracil to the top of the wart only daily using q-tip. Allow to dry and cover with bandage.   hydrOXYzine 25 MG tablet Commonly known as: ATARAX Take 1 tablet (25 mg total) by mouth every 8 (eight) hours as needed.   medroxyPROGESTERone 10 MG tablet Commonly known as: PROVERA Take 1 tablet (10 mg total) by mouth daily.   montelukast 10 MG tablet Commonly known as: SINGULAIR Take 1 tablet (10 mg total) by mouth at bedtime.   montelukast 10 MG tablet Commonly known as: Singulair Take 1 tablet (10 mg total) by mouth at bedtime.   nystatin ointment Commonly known as: MYCOSTATIN Apply 1 application. topically 2 (two) times daily.   ondansetron 4 MG disintegrating tablet Commonly known as: ZOFRAN-ODT Take 1 tablet (4 mg total) by mouth every 8 (eight) hours as  needed for nausea or vomiting.   Ryaltris 371-06 MCG/ACT Susp Generic drug: Olopatadine-Mometasone 2 sprays 2 times daily for congestion or nasal drainage.   Salicylic Acid Wart Remover 27.5 % Liqd Generic drug: Salicylic Acid Apply 1 drop of salicylic acid with pea sized amount of fluorouracil to the top of the wart only daily using q-tip. Allow to dry and cover with bandage.   triamcinolone cream 0.1 % Commonly known as: KENALOG Apply 1 application. topically 2 (two)  times daily. To affected area(s) as needed   Trulicity 1.5 BD/5.3GD Sopn Generic drug: Dulaglutide Inject 1.5 mg into the skin once a week.   valACYclovir 1000 MG tablet Commonly known as: VALTREX Take 1 tablet (1,000 mg total) by mouth daily.   vitamin A & D ointment Apply 1 application. topically as needed for dry skin. Apply twice a day to rectum and vaginal area with nystatin cream.         ALLERGIES: Allergies  Allergen Reactions   Ceftriaxone Hives and Itching   Methocarbamol Itching and Rash   Sulfa Antibiotics Other (See Comments), Hives, Itching and Rash    Reaction unknown     REVIEW OF SYSTEMS: A comprehensive ROS was conducted with the patient and is negative except as per HPI     OBJECTIVE:   VITAL SIGNS: Ht 5\' 8"  (1.727 m)   Wt (!) 370 lb (167.8 kg)   BMI 56.26 kg/m    PHYSICAL EXAM:  General: Pt appears well and is in NAD  Neck: General: Supple without adenopathy or carotid bruits. Thyroid: Thyroid size normal.  No goiter or nodules appreciated.   Lungs: Clear with good BS bilat with no rales, rhonchi, or wheezes  Heart: RRR   Abdomen:  soft, nontender  Extremities:  Lower extremities - No pretibial edema. No lesions.  Neuro: MS is good with appropriate affect, pt is alert and Ox3    DM foot exam: 08/05/2022  The skin of the feet is intact without sores or ulcerations. The pedal pulses are 2+ on right and 2+ on left. The sensation is intact to a screening 5.07, 10 gram  monofilament bilaterally    DATA REVIEWED:  Lab Results  Component Value Date   HGBA1C 7.5 (A) 08/05/2022   HGBA1C 6.9 (H) 04/17/2022   HGBA1C 12.2 (H) 02/12/2022      ASSESSMENT / PLAN / RECOMMENDATIONS:   1) Type 2 Diabetes Mellitus, Sub-Optimally controlled, Without complications - Most recent A1c of 7.5 %. Goal A1c < 7.0 %.    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, given history of multiple DKA's in the past, and currently without any glycemic agents for the past 3 months with an A1c of 7.5% -She is having difficulty obtaining Trulicity through the pharmacy, something regarding private versus Medicaid insurance -I have sent a prescription for Trulicity 1.5 mg weekly -The patient prefers Ozempic as she noted better weight loss, but she also endorses vomiting with eating certain foods such as eggs while on Ozempic -She was provided with a sample of Ozempic pen today, to start using until her insurance issue is sorted out -Here lately she has been noted with hyperglycemia, this is attributed to glucocorticoid use, we discussed the effect of glucocorticoids and increasing carbohydrate sensitivity, she was advised to limit as much CHO while on glucocorticoids   MEDICATIONS: Restart Trulicity 1.5 mg weekly  EDUCATION / INSTRUCTIONS: BG monitoring instructions: Patient is instructed to check her blood sugars 3 times a day. Call [facility] if: BG persistently < 70  I reviewed the Rule of 15 for the treatment of hypoglycemia in detail with the patient. Literature supplied.   2) Diabetic complications:  Eye: Does not have known diabetic retinopathy.  Neuro/ Feet: Does not have known diabetic peripheral neuropathy. Renal: Patient does not have known baseline CKD. She is not on an ACEI/ARB at present.      Signed electronically by: Mack Guise, MD  [facility]  [facility] [address] [place],  [place] [address] Phone: [phone] FAX: [phone]   CC: Tollie Eth, NP [address] [place] [address] Phone: [phone]  Fax: [phone]    Return to Endocrinology clinic as below: Future Appointments  Date Time Provider Department Center  08/13/2022 10:00 AM Early, Sung Amabile, NP [facility]  09/05/2022  3:15 PM Jerene Bears, MD [facility]  10/23/2022 10:20 AM Marcelyn Bruins, MD [facility] None

## 2022-08-05 NOTE — Patient Instructions (Signed)
Restart Trulicity 1.5 mg weekly    HOW TO TREAT LOW BLOOD SUGARS (Blood sugar LESS THAN 70 MG/DL) Please follow the RULE OF 15 for the treatment of hypoglycemia treatment (when your (blood sugars are less than 70 mg/dL)   STEP 1: Take 15 grams of carbohydrates when your blood sugar is low, which includes:  3-4 GLUCOSE TABS  OR 3-4 OZ OF JUICE OR REGULAR SODA OR ONE TUBE OF GLUCOSE GEL    STEP 2: RECHECK blood sugar in 15 MINUTES STEP 3: If your blood sugar is still low at the 15 minute recheck --> then, go back to STEP 1 and treat AGAIN with another 15 grams of carbohydrates.

## 2022-08-06 ENCOUNTER — Encounter: Payer: Self-pay | Admitting: Internal Medicine

## 2022-08-13 ENCOUNTER — Ambulatory Visit (INDEPENDENT_AMBULATORY_CARE_PROVIDER_SITE_OTHER): Payer: Medicaid Other | Admitting: Nurse Practitioner

## 2022-08-13 ENCOUNTER — Encounter: Payer: Self-pay | Admitting: Nurse Practitioner

## 2022-08-13 VITALS — BP 128/86 | HR 95 | Ht 68.0 in | Wt 370.0 lb

## 2022-08-13 DIAGNOSIS — Z6841 Body Mass Index (BMI) 40.0 and over, adult: Secondary | ICD-10-CM

## 2022-08-13 DIAGNOSIS — M5481 Occipital neuralgia: Secondary | ICD-10-CM

## 2022-08-13 DIAGNOSIS — J4520 Mild intermittent asthma, uncomplicated: Secondary | ICD-10-CM

## 2022-08-13 DIAGNOSIS — E11620 Type 2 diabetes mellitus with diabetic dermatitis: Secondary | ICD-10-CM

## 2022-08-13 DIAGNOSIS — G8929 Other chronic pain: Secondary | ICD-10-CM

## 2022-08-13 DIAGNOSIS — Z794 Long term (current) use of insulin: Secondary | ICD-10-CM

## 2022-08-13 DIAGNOSIS — M545 Low back pain, unspecified: Secondary | ICD-10-CM

## 2022-08-13 HISTORY — DX: Occipital neuralgia: M54.81

## 2022-08-13 MED ORDER — METHYLPREDNISOLONE ACETATE 80 MG/ML IJ SUSP
80.0000 mg | Freq: Once | INTRAMUSCULAR | Status: AC
  Administered 2022-08-13: 80 mg via INTRAMUSCULAR

## 2022-08-13 MED ORDER — GABAPENTIN 300 MG PO CAPS: 300.0000 mg | ORAL_CAPSULE | Freq: Every day | ORAL | 3 refills | Status: DC

## 2022-08-13 NOTE — Assessment & Plan Note (Signed)
A chiropractic evaluation has revealed degenerative disc disease, mild scoliosis, and hip imbalance. She is experiencing worsening lumbar pain.  Plan: A referral to a spine specialist is placed for further evaluation and management. Number provided for patient to contact them to set up att.  Consideration of a cortisone injection for neck and back pain relief is suggested.

## 2022-08-13 NOTE — Patient Instructions (Addendum)
I have sent the referral in for physical therapy for both your back and your neck.   I have sent in the gabapentin to help with your pain.   Call the spinal specialist and set up an appointment.

## 2022-08-13 NOTE — Assessment & Plan Note (Signed)
The patient's A1C level has increased to 7.4. There is difficulty obtaining Trulicity, and the patient is currently using an Ozempic sample from endocrinology. She is also using dexcom for monitoring.  Plan: Continue to collaborate with endocrinology for management.

## 2022-08-13 NOTE — Progress Notes (Signed)
Monica Keeler, DNP, AGNP-c [facility]  [address] [place], [place] [address] [phone]  ESTABLISHED PATIENT- Chronic Health and/or Follow-Up Visit  Blood pressure 128/86, pulse 95, height 5' 8"$  (1.727 m), weight (!) 370 lb (167.8 kg), SpO2 97 %.    Labrittany Hunter is a 42 y.o. year old female presenting today for evaluation and management of the following:  Monica Hunter presents today with concerns regarding her severe migraines that have persisted for the past three to four weeks. She characterizes the headaches as originating at the base of her skull and radiating to encompass her entire head, with a throbbing sensation akin to a brain freeze. Accompanying symptoms during these episodes include dizziness, shortness of breath, difficulty speaking, and impaired concentration. She has sought medical attention multiple times, resulting in three hospital visits and one hospitalization due to exacerbation. Despite consulting a neurologist, coverage issues with her insurance led to her only receiving prescriptions for prednisone, Imitrex, and another medication whose name begins with "A" or "L." The imitrex and prednisone have helped, but she still feels the pain in her neck.    An endocrinologist visit resulted in a sample of Ozempic to substitute for Trulicity, which has been out of stock at her pharmacy, contributing to an increase in her A1C to 7.4 after a three-month absence of Trulicity.  Monica Hunter also experiences neck pain and stiffness, which she suspects may be contributing to her migraines. She has been diagnosed with occipital neuralgia and possesses a chiropractic report indicating degenerative disc disease, mild scoliosis on the left side, and a discrepancy in hip height, with her left hip being 24 millimeters lower than the right. Although she has not consulted a back specialist, these findings may be relevant to her condition.  Currently, Monica Hunter is  utilizing a breathing treatment machine twice daily due to compromised lung function and is actively attempting to quit smoking. The use of prednisone has led to fluctuations in her blood sugar levels. She has been prescribed a rescue inhaler but has been unable to acquire the necessary spacer due to its cost.  All ROS negative with exception of what is listed above.   PHYSICAL EXAM Physical Exam Vitals and nursing note reviewed.  Constitutional:      Appearance: Normal appearance. She is obese.  HENT:     Head: Normocephalic.  Eyes:     Pupils: Pupils are equal, round, and reactive to light.  Cardiovascular:     Rate and Rhythm: Normal rate and regular rhythm.     Pulses: Normal pulses.  Pulmonary:     Effort: Pulmonary effort is normal.  Musculoskeletal:        General: Normal range of motion.     Cervical back: Tenderness present.     Right lower leg: Edema present.     Left lower leg: Edema present.  Skin:    General: Skin is warm and dry.     Capillary Refill: Capillary refill takes less than 2 seconds.  Neurological:     General: No focal deficit present.     Mental Status: She is alert and oriented to person, place, and time.  Psychiatric:        Mood and Affect: Mood normal.     PLAN Problem List Items Addressed This Visit     Asthma    The patient describes having the lung capacity of a "42 year old woman", uses a breathing treatment machine, and has difficulty obtaining a spacer for their inhaler. She is followed with  pulmonology.  Plan: Strongly encourage smoking cessation to improve respiratory health. Will work to see if there are low cost spacer options.       Type 2 diabetes mellitus with diabetic dermatitis, with long-term current use of insulin (HCC)    The patient's A1C level has increased to 7.4. There is difficulty obtaining Trulicity, and the patient is currently using an Ozempic sample from endocrinology. She is also using dexcom for monitoring.   Plan: Continue to collaborate with endocrinology for management.       Relevant Medications   Semaglutide (OZEMPIC, 0.25 OR 0.5 MG/DOSE, [place])   Chronic bilateral low back pain without sciatica    A chiropractic evaluation has revealed degenerative disc disease, mild scoliosis, and hip imbalance. She is experiencing worsening lumbar pain.  Plan: A referral to a spine specialist is placed for further evaluation and management. Number provided for patient to contact them to set up att.  Consideration of a cortisone injection for neck and back pain relief is suggested.      Relevant Medications   diclofenac (VOLTAREN) 50 MG EC tablet   gabapentin (NEURONTIN) 300 MG capsule   Other Relevant Orders   Ambulatory referral to Physical Therapy   Bilateral occipital neuralgia - Primary    The patient reports experiencing severe migraine-like headaches characterized by throbbing pain, dizziness, and difficulty concentrating. The headaches originate from the base of the skull and she also has tension in the neck.  Plan: Continue administration of Imitrex as needed for migraine management. Regular monitoring of headache frequency and severity is advised. A referral to a different neurologist should be considered if headaches persist or worsen. A referral for PT has been placed to help with neck spasms  Neck stretches recommended at home Trial gabapentin to see if this is helpful with some of the pain.       Relevant Medications   diclofenac (VOLTAREN) 50 MG EC tablet   SUMAtriptan (IMITREX) 100 MG tablet   gabapentin (NEURONTIN) 300 MG capsule   Other Relevant Orders   Ambulatory referral to Physical Therapy   Class 3 severe obesity due to excess calories without serious comorbidity with body mass index (BMI) of 50.0 to 59.9 in adult [facility])   Relevant Medications   Semaglutide (OZEMPIC, 0.25 OR 0.5 MG/DOSE, [place])    Return in about 6 months (around 02/11/2023).   Monica Keeler, DNP,  AGNP-c 08/13/2022  9:47 AM

## 2022-08-13 NOTE — Assessment & Plan Note (Signed)
The patient reports experiencing severe migraine-like headaches characterized by throbbing pain, dizziness, and difficulty concentrating. The headaches originate from the base of the skull and she also has tension in the neck.  Plan: Continue administration of Imitrex as needed for migraine management. Regular monitoring of headache frequency and severity is advised. A referral to a different neurologist should be considered if headaches persist or worsen. A referral for PT has been placed to help with neck spasms  Neck stretches recommended at home Trial gabapentin to see if this is helpful with some of the pain.

## 2022-08-13 NOTE — Assessment & Plan Note (Signed)
The patient describes having the lung capacity of a "42 year old woman", uses a breathing treatment machine, and has difficulty obtaining a spacer for their inhaler. She is followed with pulmonology.  Plan: Strongly encourage smoking cessation to improve respiratory health. Will work to see if there are low cost spacer options.

## 2022-08-28 NOTE — Therapy (Unsigned)
OUTPATIENT PHYSICAL THERAPY THORACOLUMBAR EVALUATION   Patient Name: Monica Hunter MRN: [id number] DOB:1981-04-12, 42 y.o., female Today's Date: 08/29/2022  END OF SESSION:  PT End of Session - 08/29/22 1302     Visit Number 1    Number of Visits 7    Date for PT Re-Evaluation 10/24/22    Authorization Type [employer]    PT Start Time 1218    PT Stop Time 1310    PT Time Calculation (min) 52 min    Activity Tolerance Patient tolerated treatment well    Behavior During Therapy [facility] for tasks assessed/performed             Past Medical History:  Diagnosis Date   Asthma    Candidal dermatitis 11/04/2021   Cornea abrasion    Diabetes mellitus    Migraines    Morbid obesity ([place])    Past Surgical History:  Procedure Laterality Date   CERVICAL POLYPECTOMY     DILATION AND CURETTAGE OF UTERUS     Patient Active Problem List   Diagnosis Date Noted   Bilateral occipital neuralgia 08/13/2022   Chronic bilateral low back pain without sciatica 03/17/2022   Overflow incontinence of urine 12/03/2021   Type 2 diabetes mellitus with diabetic dermatitis, with long-term current use of insulin ([place]) 11/04/2021   Amenorrhea 11/04/2021   Gastroesophageal reflux disease 11/04/2021   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   Asthma 02/14/2018   Class 3 severe obesity due to excess calories without serious comorbidity with body mass index (BMI) of 50.0 to 59.9 in adult ([place]) 02/14/2018   Hidradenitis suppurativa 02/14/2018    PCP: Orma Render, NP  REFERRING PROVIDER: Orma Render, NP  REFERRING DIAG: M54.81 (ICD-10-CM) - Bilateral occipital neuralgia M54.50,G89.29 (ICD-10-CM) - Chronic bilateral low back pain without sciatica  Rationale for Evaluation and Treatment: Rehabilitation  THERAPY DIAG: M54.81 (ICD-10-CM) - Bilateral occipital neuralgia M54.50,G89.29 (ICD-10-CM) - Chronic bilateral low back pain without sciatica   ONSET DATE: chronic  SUBJECTIVE:                                                                                                                                                                                            SUBJECTIVE STATEMENT: Monica Hunter is a 42 y.o. year old female presenting today for evaluation and management of the following:   Monica Hunter presents today with concerns regarding her severe migraines that have persisted for the past three to four weeks. She characterizes the headaches as originating at the base of her skull and radiating to encompass her entire head, with a throbbing sensation akin to a brain freeze. Accompanying symptoms during  these episodes include dizziness, shortness of breath, difficulty speaking, and impaired concentration. She has sought medical attention multiple times, resulting in three hospital visits and one hospitalization due to exacerbation. Despite consulting a neurologist, coverage issues with her insurance led to her only receiving prescriptions for prednisone, Imitrex, and another medication whose name begins with "A" or "L." The imitrex and prednisone have helped, but she still feels the pain in her neck.   PERTINENT HISTORY:  Monica Hunter also experiences neck pain and stiffness, which she suspects may be contributing to her migraines. She has been diagnosed with occipital neuralgia and possesses a chiropractic report indicating degenerative disc disease, mild scoliosis on the left side, and a discrepancy in hip height, with her left hip being 24 millimeters lower than the right. Although she has not consulted a back specialist, these findings may be relevant to her condition.  PAIN:  Are you having pain? Yes: NPRS scale: 8/10 Pain location: HA and low back pain centrally Pain description: ache Aggravating factors: activity Relieving factors: meds 6/10 low back pain  PRECAUTIONS: None  WEIGHT BEARING RESTRICTIONS: No  FALLS:  Has patient fallen in last 6 months? 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  PLOF: Independent  PATIENT GOALS: To manage my symptoms  NEXT MD VISIT: 6 mo f/u with neurology  OBJECTIVE:   DIAGNOSTIC FINDINGS:  None available  PATIENT SURVEYS:  FOTO 36(51 predicted)  SCREENING FOR RED FLAGS: negative  MUSCLE LENGTH: Hamstrings: Right 60 deg; Left 60 deg  POSTURE: rounded shoulders and forward head  PALPATION: Deferred due to body habitus  CERVICAL ROM:   Active ROM A/PROM (deg) 08/29/2022  Flexion 100%  Extension 75%  Right lateral flexion 75%  Left lateral flexion 90%  Right rotation 90%  Left rotation 100%   (Blank rows = not tested)  UE ROM: WNL  Active ROM Right 08/29/2022 Left 08/29/2022  Shoulder flexion    Shoulder extension    Shoulder abduction    Shoulder adduction    Shoulder extension    Shoulder internal rotation    Shoulder external rotation    Elbow flexion    Elbow extension    Wrist flexion    Wrist extension    Wrist ulnar deviation    Wrist radial deviation    Wrist pronation    Wrist supination     (Blank rows = not tested)  UE MMT: WNL  MMT Right 08/29/2022 Left 08/29/2022  Shoulder flexion    Shoulder extension    Shoulder abduction    Shoulder adduction    Shoulder extension    Shoulder internal rotation    Shoulder external rotation    Middle trapezius    Lower trapezius    Elbow flexion    Elbow extension    Wrist flexion    Wrist extension    Wrist ulnar deviation    Wrist radial deviation    Wrist pronation    Wrist supination    Grip strength     (Blank rows = not tested)  CERVICAL SPECIAL TESTS:  Neck flexor muscle endurance test: 24s   FUNCTIONAL TESTS:  5 times sit to stand: 16s with UE Support   LUMBAR ROM:   AROM eval  Flexion   Extension   Right lateral flexion   Left lateral flexion   Right rotation 90%  Left rotation 90%   (Blank rows = not tested)  LOWER EXTREMITY ROM:   WFL  Active  Right eval Left eval  Hip flexion    Hip extension    Hip  abduction    Hip adduction    Hip internal rotation    Hip external rotation    Knee flexion    Knee extension    Ankle dorsiflexion    Ankle plantarflexion    Ankle inversion    Ankle eversion     (Blank rows = not tested)  LOWER EXTREMITY MMT:    MMT Right eval Left eval  Hip flexion 4- 4-  Hip extension 4- 4-  Hip abduction 4- 4-  Hip adduction    Hip internal rotation    Hip external rotation    Knee flexion    Knee extension 4- 4-  Ankle dorsiflexion    Ankle plantarflexion 4- 4-  Ankle inversion    Ankle eversion     (Blank rows = not tested)  LUMBAR SPECIAL TESTS:  Straight leg raise test: Negative, Slump test: Negative, and FABER test: deferred  GAIT: Distance walked: 53fx2 Assistive device utilized: None Level of assistance: Complete Independence Comments: unremarkable  TODAY'S TREATMENT:                                                                                                                              DATE: 08/29/22 Eval     PATIENT EDUCATION:  Education details: Discussed eval findings, rehab rationale and POC and patient is in agreement  Person educated: Patient Education method: Explanation Education comprehension: verbalized understanding and needs further education  HOME EXERCISE PROGRAM: Access Code: [email or web address] URL: [email or web address] Date: 08/29/2022 Prepared by: JSharlynn Oliphant Exercises - Seated Cervical Retraction  - 3 x daily - 5 x weekly - 2 sets - 10 reps - 3s hold - Supine Chin Tuck  - 3 x daily - 5 x weekly - 2 sets - 10 reps - 3s hold - Supine Bridge  - 3 x daily - 5 x weekly - 2 sets - 10 reps - 3s hold - Sit to Stand with Arms Crossed  - 1 x daily - 5 x weekly - 1 sets - 5 reps  ASSESSMENT:  CLINICAL IMPRESSION: Patient is a 42y.o. female who was seen today for physical therapy evaluation and treatment for suboccipital neuralgia and low back pain w/o sciatica.  Patient presents with weakness in LE's  and unable to stand w/o UE support.  Cervical mobility slightly limited in upper cervical spine.  BUE ROM and strength WNL, deep neck flexor strength functional.  Patient demonstrates a rounded shoulder and forward head posture impinging suboccipital nerves.  OBJECTIVE IMPAIRMENTS: decreased activity tolerance, decreased knowledge of condition, decreased mobility, decreased ROM, decreased strength, impaired perceived functional ability, impaired flexibility, postural dysfunction, obesity, and pain.   ACTIVITY LIMITATIONS: carrying, lifting, bending, sitting, standing, squatting, and sleeping  PERSONAL FACTORS: Fitness and 1 comorbidity: diabetes  are also affecting patient's functional outcome.   REHAB POTENTIAL: Good  CLINICAL DECISION MAKING: Evolving/moderate complexity  EVALUATION COMPLEXITY:  Moderate   GOALS: Goals reviewed with patient? Yes  SHORT TERM GOALS: Target date: 09/19/2022   Patient to demonstrate independence in HEP  Baseline: [id number] Goal status: INITIAL  2.  Patient to perform single sit to stand w/o UE support Baseline: Requires UE support Goal status: INITIAL  3.  Decrease HA intensity to 6/10 Baseline: 8/10 Goal status: INITIAL    LONG TERM GOALS: Target date: 10/10/2022    Increase B LE strength to 4/5 Baseline:  MMT Right eval Left eval  Hip flexion 4- 4-  Hip extension 4- 4-  Hip abduction 4- 4-  Hip adduction    Hip internal rotation    Hip external rotation    Knee flexion    Knee extension 4- 4-  Ankle dorsiflexion    Ankle plantarflexion 4- 4-   Goal status: INITIAL  2.  Increase FOTO score to 51 Baseline: 36 Goal status: INITIAL  3.  Increase cervical AROM to 90% in deficit areas Baseline:  Active ROM A/PROM (deg) 08/29/2022  Flexion 100%  Extension 75%  Right lateral flexion 75%  Left lateral flexion 90%  Right rotation 90%  Left rotation 100%   Goal status: INITIAL  4.  Decrease HA intensity to 4/10 Baseline: 8/10  intensity Goal status: INITIAL  5.  70d SLR Bil Baseline: 60d SLR Bil Goal status: INITIAL    PLAN:  PT FREQUENCY: 1x/week  PT DURATION: 6 weeks  PLANNED INTERVENTIONS: Therapeutic exercises, Therapeutic activity, Neuromuscular re-education, Balance training, Gait training, Patient/Family education, Self Care, Joint mobilization, Dry Needling, Electrical stimulation, Spinal mobilization, Manual therapy, and Re-evaluation.  PLAN FOR NEXT SESSION: HEP review, LE flexibility tasks, core strength, postural exercises, aerobic work, manual as needed   Lanice Shirts, PT 08/29/2022, 1:03 PM

## 2022-08-29 ENCOUNTER — Ambulatory Visit: Payer: Medicaid Other | Attending: Nurse Practitioner

## 2022-08-29 ENCOUNTER — Other Ambulatory Visit: Payer: Self-pay

## 2022-08-29 DIAGNOSIS — M5481 Occipital neuralgia: Secondary | ICD-10-CM | POA: Diagnosis present

## 2022-08-29 DIAGNOSIS — G8929 Other chronic pain: Secondary | ICD-10-CM | POA: Insufficient documentation

## 2022-08-29 DIAGNOSIS — M6281 Muscle weakness (generalized): Secondary | ICD-10-CM | POA: Insufficient documentation

## 2022-08-29 DIAGNOSIS — M545 Low back pain, unspecified: Secondary | ICD-10-CM | POA: Insufficient documentation

## 2022-08-29 DIAGNOSIS — M5459 Other low back pain: Secondary | ICD-10-CM | POA: Diagnosis present

## 2022-09-05 ENCOUNTER — Ambulatory Visit (INDEPENDENT_AMBULATORY_CARE_PROVIDER_SITE_OTHER): Payer: Medicare Other | Admitting: Obstetrics & Gynecology

## 2022-09-05 ENCOUNTER — Encounter (HOSPITAL_BASED_OUTPATIENT_CLINIC_OR_DEPARTMENT_OTHER): Payer: Self-pay | Admitting: Obstetrics & Gynecology

## 2022-09-05 VITALS — BP 132/86 | HR 89 | Wt 372.4 lb

## 2022-09-05 DIAGNOSIS — Z6841 Body Mass Index (BMI) 40.0 and over, adult: Secondary | ICD-10-CM | POA: Diagnosis not present

## 2022-09-05 DIAGNOSIS — N911 Secondary amenorrhea: Secondary | ICD-10-CM

## 2022-09-05 DIAGNOSIS — Z794 Long term (current) use of insulin: Secondary | ICD-10-CM

## 2022-09-05 DIAGNOSIS — E11620 Type 2 diabetes mellitus with diabetic dermatitis: Secondary | ICD-10-CM | POA: Diagnosis not present

## 2022-09-05 MED ORDER — NORETHINDRONE 0.35 MG PO TABS: 1.0000 | ORAL_TABLET | Freq: Every day | ORAL | 1 refills | Status: AC

## 2022-09-06 ENCOUNTER — Other Ambulatory Visit (HOSPITAL_BASED_OUTPATIENT_CLINIC_OR_DEPARTMENT_OTHER): Payer: Self-pay

## 2022-09-09 ENCOUNTER — Encounter: Payer: Self-pay | Admitting: Nurse Practitioner

## 2022-09-10 ENCOUNTER — Encounter (HOSPITAL_BASED_OUTPATIENT_CLINIC_OR_DEPARTMENT_OTHER): Payer: Self-pay | Admitting: Obstetrics & Gynecology

## 2022-09-10 NOTE — Progress Notes (Signed)
GYNECOLOGY  VISIT  CC:   recheck, h/o secondary oligomenorrhea   HPI: 42 y.o. G0P0000 Single Black or [place] American female here for recheck.  Pt has hx of skipping cycles and when she does cycle, can have significant cramping.  She was started on micronor in October and did have cycles each mont with this.  She continues to desire pregnancy.  hbA1c is much better with most recent test results at 7.5 which is down from 12.2.  She has been on ozympic which caused nausea and emesis with certain foods.  This was switched to trulicity.  Not taking right now as has changed insurance.  Strongly encouraged her to contact pharmacy and possibly PCP to ensure she is continuing her treatment for diabetes.  She is still smoking.  She does understand that her obesity is partly the cause of her issues with pregnancy as well as age at this point.  Given risks for endometrial abnormalities if does not do anything to treat irregular bleeding, she is ok with restarting the micronor daily.  Rx will be sent to pharmacy.       Past Medical History:  Diagnosis Date   Asthma    Candidal dermatitis 11/04/2021   Cornea abrasion    Diabetes mellitus    Migraines    Morbid obesity ([place])     MEDS:   Current Outpatient Medications on File Prior to Visit  Medication Sig Dispense Refill   albuterol (PROVENTIL) (2.5 MG/3ML) 0.083% nebulizer solution Take 3 mLs (2.5 mg total) by nebulization every 4 (four) hours as needed for wheezing or shortness of breath. 75 mL 1   albuterol (VENTOLIN HFA) 108 (90 Base) MCG/ACT inhaler Inhale 2 puffs into the lungs every 6 (six) hours as needed for wheezing or shortness of breath. 18 g 1   Continuous Blood Gluc Receiver (DEXCOM G7 RECEIVER) DEVI Use to check BG 1 each 0   Continuous Blood Gluc Sensor (DEXCOM G7 SENSOR) MISC Apply one sensor to the abdomen every 10 days. 3 each 11   diclofenac (VOLTAREN) 50 MG EC tablet Take 50 mg by mouth as needed.     famotidine (PEPCID) 20 MG  tablet Take 1 tablet (20 mg total) by mouth 2 (two) times daily. 60 tablet 2   fluconazole (DIFLUCAN) 150 MG tablet Take one tablet every 3 days. 6 tablet 2   fluoruracil (CARAC) 0.5 % cream Apply topically daily. Apply 1 drop of salicylic acid with pea sized amount of fluorouracil to the top of the wart only daily using q-tip. Allow to dry and cover with bandage. 30 g 0   gabapentin (NEURONTIN) 300 MG capsule Take 1 capsule (300 mg total) by mouth at bedtime. 30 capsule 3   hydrOXYzine (ATARAX) 25 MG tablet Take 1 tablet (25 mg total) by mouth every 8 (eight) hours as needed. 90 tablet 3   montelukast (SINGULAIR) 10 MG tablet Take 1 tablet (10 mg total) by mouth at bedtime. 30 tablet 2   nystatin ointment (MYCOSTATIN) Apply 1 application. topically 2 (two) times daily. 30 g 6   ondansetron (ZOFRAN-ODT) 4 MG disintegrating tablet Take 1 tablet (4 mg total) by mouth every 8 (eight) hours as needed for nausea or vomiting. 20 tablet 0   Salicylic Acid (SALICYLIC ACID WART REMOVER) 27.5 % LIQD Apply 1 drop of salicylic acid with pea sized amount of fluorouracil to the top of the wart only daily using q-tip. Allow to dry and cover with bandage. 10 mL 1  Semaglutide (OZEMPIC, 0.25 OR 0.5 MG/DOSE, [place]) Inject 0.5 mg into the skin once a week.     SUMAtriptan (IMITREX) 100 MG tablet Take 100 mg by mouth as needed for migraine. May repeat in 2 hours if headache persists or recurs.     triamcinolone cream (KENALOG) 0.1 % Apply 1 application. topically 2 (two) times daily. To affected area(s) as needed 30 g 3   valACYclovir (VALTREX) 1000 MG tablet Take 1 tablet (1,000 mg total) by mouth daily. 30 tablet 6   Vitamins A & D (VITAMIN A & D) ointment Apply 1 application. topically as needed for dry skin. Apply twice a day to rectum and vaginal area with nystatin cream. 45 g 3   Spacer/Aero-Holding Chambers (AEROCHAMBER PLUS WITH MASK) inhaler Use as directed with Metered Dose Inhaler (Patient not taking: Reported on  09/05/2022) 1 each 1   No current facility-administered medications on file prior to visit.    ALLERGIES: Ceftriaxone, Methocarbamol, and Sulfa antibiotics  SH:  single, smoker  Review of Systems  Constitutional: Negative.   Genitourinary: Negative.     PHYSICAL EXAMINATION:    BP (!) 132/90 (BP Location: Left Arm, Patient Position: Sitting, Cuff Size: Large)   Pulse 89   Wt (!) 372 lb 6.4 oz (168.9 kg)   BMI 56.62 kg/m     General appearance: alert, cooperative and appears stated age  Assessment/Plan: 1. Secondary amenorrhea - will restart micronor. Rx will be sent to pharamcy. - norethindrone (MICRONOR) 0.35 MG tablet; Take 1 tablet (0.35 mg total) by mouth daily.  Dispense: 84 tablet; Refill: 1  2. Class 3 severe obesity due to excess calories without serious comorbidity with body mass index (BMI) of 50.0 to 59.9 in adult ([place])  3. Type 2 diabetes mellitus with diabetic dermatitis, with long-term current use of insulin ([place]) - encouraged pt to contact pharmacy and/or PCP to ensure has correct prescriptions that are covered by her insurance.

## 2022-09-13 ENCOUNTER — Ambulatory Visit: Payer: Medicaid Other

## 2022-09-13 NOTE — Therapy (Deleted)
OUTPATIENT PHYSICAL THERAPY TREATMENT NOTE   Patient Name: Monica Hunter MRN: [id number] DOB:03/07/1981, 42 y.o., female Today's Date: 09/13/2022  PCP: Orma Render, NP  REFERRING PROVIDER: Orma Render, NP   END OF SESSION:    Past Medical History:  Diagnosis Date   Asthma    Candidal dermatitis 11/04/2021   Cornea abrasion    Diabetes mellitus    Migraines    Morbid obesity ([place])    Past Surgical History:  Procedure Laterality Date   CERVICAL POLYPECTOMY     DILATION AND CURETTAGE OF UTERUS     Patient Active Problem List   Diagnosis Date Noted   Bilateral occipital neuralgia 08/13/2022   Chronic bilateral low back pain without sciatica 03/17/2022   Overflow incontinence of urine 12/03/2021   Type 2 diabetes mellitus with diabetic dermatitis, with long-term current use of insulin ([place]) 11/04/2021   Oligomenorrhea 11/04/2021   Gastroesophageal reflux disease 11/04/2021   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 She has been diagnosed with occipital neuralgia and possesses a chiropractic report indicating degenerative disc disease, mild scoliosis on the left side, and a discrepancy in hip height, with her left hip being 24 millimeters lower than the right. Although she has not consulted a back specialist, these findings may be relevant to her condition.    PRECAUTIONS: None   SUBJECTIVE:                                                                                                                                                                                       SUBJECTIVE STATEMENT:  ***   PAIN:  Are you having pain? {[id number]}   OBJECTIVE: (objective measures completed at initial evaluation unless otherwise dated)   DIAGNOSTIC FINDINGS:  None available   PATIENT SURVEYS:  FOTO 36(51 predicted)   SCREENING FOR RED FLAGS: negative   MUSCLE LENGTH: Hamstrings: Right 60 deg; Left 60 deg   POSTURE: rounded shoulders and forward head   PALPATION: Deferred due to body habitus   CERVICAL ROM:    Active ROM A/PROM (deg) 08/29/2022  Flexion 100%  Extension 75%  Right lateral flexion  75%  Left lateral flexion 90%  Right rotation 90%  Left rotation 100%   (Blank rows = not tested)   UE ROM: WNL   Active ROM Right 08/29/2022 Left 08/29/2022  Shoulder flexion      Shoulder extension      Shoulder abduction      Shoulder adduction      Shoulder extension      Shoulder internal rotation      Shoulder external rotation      Elbow flexion      Elbow extension      Wrist flexion      Wrist extension      Wrist ulnar deviation      Wrist radial deviation      Wrist pronation      Wrist supination       (Blank rows = not tested)   UE MMT: WNL   MMT Right 08/29/2022 Left 08/29/2022  Shoulder flexion      Shoulder extension      Shoulder abduction      Shoulder adduction      Shoulder extension      Shoulder internal rotation      Shoulder external rotation      Middle trapezius      Lower trapezius      Elbow flexion      Elbow extension      Wrist flexion      Wrist extension      Wrist ulnar deviation      Wrist radial deviation      Wrist pronation      Wrist supination      Grip strength       (Blank rows = not tested)   CERVICAL SPECIAL TESTS:  Neck flexor muscle endurance test: 24s     FUNCTIONAL TESTS:  5 times sit to stand: 16s  with UE Support     LUMBAR ROM:    AROM eval  Flexion    Extension    Right lateral flexion    Left lateral flexion    Right rotation 90%  Left rotation 90%   (Blank rows = not tested)   LOWER EXTREMITY ROM:   WFL   Active  Right eval Left eval  Hip flexion      Hip extension      Hip abduction      Hip adduction      Hip internal rotation      Hip external rotation      Knee flexion      Knee extension      Ankle dorsiflexion      Ankle plantarflexion      Ankle inversion      Ankle eversion       (Blank rows = not tested)   LOWER EXTREMITY MMT:     MMT Right eval Left eval  Hip flexion 4- 4-  Hip extension 4- 4-  Hip abduction 4- 4-  Hip adduction      Hip internal rotation      Hip external rotation      Knee flexion      Knee extension 4- 4-  Ankle dorsiflexion      Ankle plantarflexion 4- 4-  Ankle inversion      Ankle eversion       (Blank rows = not tested)   LUMBAR SPECIAL TESTS:  Straight leg raise test: Negative, Slump test: Negative, and FABER test: deferred  GAIT: Distance walked: 25ftx2 Assistive device utilized: None Level of assistance: Complete Independence Comments: unremarkable   TODAY'S TREATMENT:                                                                                                                              DATE: 08/29/22 Eval       PATIENT EDUCATION:  Education details: Discussed eval findings, rehab rationale and POC and patient is in agreement  Person educated: Patient Education method: Explanation Education comprehension: verbalized understanding and needs further education   HOME EXERCISE PROGRAM: Access Code: [email or web address] URL: [email or web address] Date: 08/29/2022 Prepared by: Sharlynn Oliphant   Exercises - Seated Cervical Retraction  - 3 x daily - 5 x weekly - 2 sets - 10 reps - 3s hold - Supine Chin Tuck  - 3 x daily - 5 x weekly - 2 sets - 10 reps - 3s hold - Supine Bridge  - 3 x daily -  5 x weekly - 2 sets - 10 reps - 3s hold - Sit to Stand with Arms Crossed  - 1 x daily - 5 x weekly - 1 sets - 5 reps   ASSESSMENT:   CLINICAL IMPRESSION: Patient is a 42 y.o. female who was seen today for physical therapy evaluation and treatment for suboccipital neuralgia and low back pain w/o sciatica.  Patient presents with weakness in LE's and unable to stand w/o UE support.  Cervical mobility slightly limited in upper cervical spine.  BUE ROM and strength WNL, deep neck flexor strength functional.  Patient demonstrates a rounded shoulder and forward head posture impinging suboccipital nerves.   OBJECTIVE IMPAIRMENTS: decreased activity tolerance, decreased knowledge of condition, decreased mobility, decreased ROM, decreased strength, impaired perceived functional ability, impaired flexibility, postural dysfunction, obesity, and pain.    ACTIVITY LIMITATIONS: carrying, lifting, bending, sitting, standing, squatting, and sleeping   PERSONAL FACTORS: Fitness and 1 comorbidity: diabetes  are also affecting patient's functional outcome.    REHAB POTENTIAL: Good   CLINICAL DECISION MAKING: Evolving/moderate complexity   EVALUATION COMPLEXITY: Moderate     GOALS: Goals reviewed with patient? Yes   SHORT TERM GOALS: Target date: 09/19/2022   Patient to demonstrate independence in HEP  Baseline: [id number] Goal status: INITIAL   2.  Patient to perform single sit to stand w/o UE support Baseline: Requires UE support Goal status: INITIAL   3.  Decrease HA intensity to 6/10 Baseline: 8/10 Goal status: INITIAL       LONG TERM GOALS: Target date: 10/10/2022     Increase B LE strength to 4/5 Baseline:  MMT Right eval Left eval  Hip flexion 4- 4-  Hip extension 4- 4-  Hip abduction 4- 4-  Hip adduction      Hip internal rotation      Hip external rotation      Knee flexion      Knee extension 4- 4-  Ankle dorsiflexion  Ankle plantarflexion 4- 4-    Goal status:  INITIAL   2.  Increase FOTO score to 51 Baseline: 36 Goal status: INITIAL   3.  Increase cervical AROM to 90% in deficit areas Baseline:  Active ROM A/PROM (deg) 08/29/2022  Flexion 100%  Extension 75%  Right lateral flexion 75%  Left lateral flexion 90%  Right rotation 90%  Left rotation 100%    Goal status: INITIAL   4.  Decrease HA intensity to 4/10 Baseline: 8/10 intensity Goal status: INITIAL   5.  70d SLR Bil Baseline: 60d SLR Bil Goal status: INITIAL       PLAN:   PT FREQUENCY: 1x/week   PT DURATION: 6 weeks   PLANNED INTERVENTIONS: Therapeutic exercises, Therapeutic activity, Neuromuscular re-education, Balance training, Gait training, Patient/Family education, Self Care, Joint mobilization, Dry Needling, Electrical stimulation, Spinal mobilization, Manual therapy, and Re-evaluation.   PLAN FOR NEXT SESSION: HEP review, LE flexibility tasks, core strength, postural exercises, aerobic work, manual as needed   Lanice Shirts, PT 09/13/2022, 12:11 PM

## 2022-09-19 NOTE — Therapy (Signed)
OUTPATIENT PHYSICAL THERAPY TREATMENT NOTE   Patient Name: Monica Hunter MRN: [id number] DOB:16-Oct-1980, 42 y.o., female Today's Date: 09/20/2022  PCP: Orma Render, NP  REFERRING PROVIDER: Orma Render, NP   END OF SESSION:   PT End of Session - 09/20/22 1258     Visit Number 2    Number of Visits 7    Date for PT Re-Evaluation 10/24/22    Authorization Type [employer]    PT Start Time 1300    PT Stop Time 1340    PT Time Calculation (min) 40 min    Activity Tolerance Patient tolerated treatment well    Behavior During Therapy [facility] for tasks assessed/performed             Past Medical History:  Diagnosis Date   Asthma    Candidal dermatitis 11/04/2021   Cornea abrasion    Diabetes mellitus    Migraines    Morbid obesity ([place])    Past Surgical History:  Procedure Laterality Date   CERVICAL POLYPECTOMY     DILATION AND CURETTAGE OF UTERUS     Patient Active Problem List   Diagnosis Date Noted   Bilateral occipital neuralgia 08/13/2022   Chronic bilateral low back pain without sciatica 03/17/2022   Overflow incontinence of urine 12/03/2021   Type 2 diabetes mellitus with diabetic dermatitis, with long-term current use of insulin ([place]) 11/04/2021   Oligomenorrhea 11/04/2021   Gastroesophageal reflux disease 11/04/2021   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   Asthma 02/14/2018   Class 3 severe obesity due to excess calories without serious comorbidity with body mass index (BMI) of 50.0 to 59.9 in adult ([place]) 02/14/2018   Hidradenitis suppurativa 02/14/2018    REFERRING DIAG: M54.81 (ICD-10-CM) - Bilateral occipital neuralgia M54.50,G89.29 (ICD-10-CM) - Chronic bilateral low back pain without sciatica   THERAPY DIAG: M54.81 (ICD-10-CM) - Bilateral occipital neuralgia M54.50,G89.29 (ICD-10-CM) - Chronic bilateral low back pain without sciatica    Rationale for Evaluation and Treatment Rehabilitation  PERTINENT HISTORY: Monica Hunter also experiences neck pain and  stiffness, which she suspects may be contributing to her migraines. She has been diagnosed with occipital neuralgia and possesses a chiropractic report indicating degenerative disc disease, mild scoliosis on the left side, and a discrepancy in hip height, with her left hip being 24 millimeters lower than the right. Although she has not consulted a back specialist, these findings may be relevant to her condition.   PRECAUTIONS: none  SUBJECTIVE:  SUBJECTIVE STATEMENT:  Quit smoking and is tying to get used to it.  Awoke with an increase in low back pain w/o aggravating factors.    PAIN:  Are you having pain? Yes: NPRS scale: 6-8/10 Pain location: neck and back Pain description: ache Aggravating factors: prolonged positioning Relieving factors: position changes   OBJECTIVE: (objective measures completed at initial evaluation unless otherwise dated)   DIAGNOSTIC FINDINGS:  None available   PATIENT SURVEYS:  FOTO 36(51 predicted)   SCREENING FOR RED FLAGS: negative   MUSCLE LENGTH: Hamstrings: Right 60 deg; Left 60 deg   POSTURE: rounded shoulders and forward head   PALPATION: Deferred due to body habitus   CERVICAL ROM:    Active ROM A/PROM (deg) 08/29/2022  Flexion 100%  Extension 75%  Right lateral flexion 75%  Left lateral flexion 90%  Right rotation 90%  Left rotation 100%   (Blank rows = not tested)   UE ROM: WNL   Active ROM Right 08/29/2022 Left 08/29/2022  Shoulder flexion      Shoulder extension      Shoulder abduction      Shoulder adduction      Shoulder extension      Shoulder internal rotation      Shoulder external rotation      Elbow flexion      Elbow extension      Wrist flexion      Wrist extension      Wrist ulnar deviation      Wrist radial deviation       Wrist pronation      Wrist supination       (Blank rows = not tested)   UE MMT: WNL   MMT Right 08/29/2022 Left 08/29/2022  Shoulder flexion      Shoulder extension      Shoulder abduction      Shoulder adduction      Shoulder extension      Shoulder internal rotation      Shoulder external rotation      Middle trapezius      Lower trapezius      Elbow flexion      Elbow extension      Wrist flexion      Wrist extension      Wrist ulnar deviation      Wrist radial deviation      Wrist pronation      Wrist supination      Grip strength       (Blank rows = not tested)   CERVICAL SPECIAL TESTS:  Neck flexor muscle endurance test: 24s     FUNCTIONAL TESTS:  5 times sit to stand: 16s with UE Support     LUMBAR ROM:    AROM eval  Flexion    Extension    Right lateral flexion    Left lateral flexion    Right rotation 90%  Left rotation 90%   (Blank rows = not tested)   LOWER EXTREMITY ROM:   WFL   Active  Right eval Left eval  Hip flexion      Hip extension      Hip abduction      Hip adduction      Hip internal rotation      Hip external rotation      Knee flexion      Knee extension      Ankle dorsiflexion      Ankle plantarflexion  Ankle inversion      Ankle eversion       (Blank rows = not tested)   LOWER EXTREMITY MMT:     MMT Right eval Left eval  Hip flexion 4- 4-  Hip extension 4- 4-  Hip abduction 4- 4-  Hip adduction      Hip internal rotation      Hip external rotation      Knee flexion      Knee extension 4- 4-  Ankle dorsiflexion      Ankle plantarflexion 4- 4-  Ankle inversion      Ankle eversion       (Blank rows = not tested)   LUMBAR SPECIAL TESTS:  Straight leg raise test: Negative, Slump test: Negative, and FABER test: deferred   GAIT: Distance walked: 23ftx2 Assistive device utilized: None Level of assistance: Complete Independence Comments: unremarkable   TODAY'S TREATMENT:        [facility] Adult PT  Treatment:                                                DATE: 09/20/22 Therapeutic Exercise: Seated hamstring stretch 30s x2 Seated sciatic stretch/glide 10/10 Supine march 15/15 Supine bridge 15x Supine shoulder flexion 15/15 Supine SLR 15/15  Open book 10/10 Supine hor abduction YTB 15x Supine QL 30s x2 Bil Supine hip fallouts RTB 15x Bil, 15/15 unilaterally S/L clams RTB 15/15 Supine chin tucks 10x2 3s holdover 1/2 roll Supine chin tuck and rotations 10/10  90/90 30sx2                                                                                                                      DATE: 08/29/22 Eval       PATIENT EDUCATION:  Education details: Discussed eval findings, rehab rationale and POC and patient is in agreement  Person educated: Patient Education method: Explanation Education comprehension: verbalized understanding and needs further education   HOME EXERCISE PROGRAM: Access Code: [email or web address] URL: [email or web address] Date: 08/29/2022 Prepared by: Sharlynn Oliphant   Exercises - Seated Cervical Retraction  - 3 x daily - 5 x weekly - 2 sets - 10 reps - 3s hold - Supine Chin Tuck  - 3 x daily - 5 x weekly - 2 sets - 10 reps - 3s hold - Supine Bridge  - 3 x daily - 5 x weekly - 2 sets - 10 reps - 3s hold - Sit to Stand with Arms Crossed  - 1 x daily - 5 x weekly - 1 sets - 5 reps   ASSESSMENT:   CLINICAL IMPRESSION: Patient returns for first f/u session.  HEP reviewed.  Instructed in additional core strength and stretch tasks.  Encouraged patient to pace and breathe.  Incorporated hip strengthening and stabilization tasks using theraband.  Added postural training and cervical ROM tasks in supine.  Difficulty finding comfortable supine position.  EVAL: Patient is a 42 y.o. female who was seen today for physical therapy evaluation and treatment for suboccipital neuralgia and low back pain w/o sciatica.  Patient presents with weakness in LE's and unable to  stand w/o UE support.  Cervical mobility slightly limited in upper cervical spine.  BUE ROM and strength WNL, deep neck flexor strength functional.  Patient demonstrates a rounded shoulder and forward head posture impinging suboccipital nerves.   OBJECTIVE IMPAIRMENTS: decreased activity tolerance, decreased knowledge of condition, decreased mobility, decreased ROM, decreased strength, impaired perceived functional ability, impaired flexibility, postural dysfunction, obesity, and pain.    ACTIVITY LIMITATIONS: carrying, lifting, bending, sitting, standing, squatting, and sleeping   PERSONAL FACTORS: Fitness and 1 comorbidity: diabetes  are also affecting patient's functional outcome.    REHAB POTENTIAL: Good   CLINICAL DECISION MAKING: Evolving/moderate complexity   EVALUATION COMPLEXITY: Moderate     GOALS: Goals reviewed with patient? Yes   SHORT TERM GOALS: Target date: 09/19/2022   Patient to demonstrate independence in HEP  Baseline: [email or web address] Goal status: INITIAL   2.  Patient to perform single sit to stand w/o UE support Baseline: Requires UE support Goal status: INITIAL   3.  Decrease HA intensity to 6/10 Baseline: 8/10 Goal status: INITIAL       LONG TERM GOALS: Target date: 10/10/2022     Increase B LE strength to 4/5 Baseline:  MMT Right eval Left eval  Hip flexion 4- 4-  Hip extension 4- 4-  Hip abduction 4- 4-  Hip adduction      Hip internal rotation      Hip external rotation      Knee flexion      Knee extension 4- 4-  Ankle dorsiflexion      Ankle plantarflexion 4- 4-    Goal status: INITIAL   2.  Increase FOTO score to 51 Baseline: 36 Goal status: INITIAL   3.  Increase cervical AROM to 90% in deficit areas Baseline:  Active ROM A/PROM (deg) 08/29/2022  Flexion 100%  Extension 75%  Right lateral flexion 75%  Left lateral flexion 90%  Right rotation 90%  Left rotation 100%    Goal status: INITIAL   4.  Decrease HA intensity to  4/10 Baseline: 8/10 intensity Goal status: INITIAL   5.  70d SLR Bil Baseline: 60d SLR Bil Goal status: INITIAL       PLAN:   PT FREQUENCY: 1x/week   PT DURATION: 6 weeks   PLANNED INTERVENTIONS: Therapeutic exercises, Therapeutic activity, Neuromuscular re-education, Balance training, Gait training, Patient/Family education, Self Care, Joint mobilization, Dry Needling, Electrical stimulation, Spinal mobilization, Manual therapy, and Re-evaluation.   PLAN FOR NEXT SESSION: HEP review, LE flexibility tasks, core strength, postural exercises, aerobic work, manual as needed   Lanice Shirts, PT 09/20/2022, 1:43 PM

## 2022-09-20 ENCOUNTER — Ambulatory Visit: Payer: Medicaid Other | Attending: Nurse Practitioner

## 2022-09-20 DIAGNOSIS — M6281 Muscle weakness (generalized): Secondary | ICD-10-CM | POA: Diagnosis present

## 2022-09-20 DIAGNOSIS — M5481 Occipital neuralgia: Secondary | ICD-10-CM | POA: Insufficient documentation

## 2022-09-20 DIAGNOSIS — M5459 Other low back pain: Secondary | ICD-10-CM | POA: Diagnosis present

## 2022-09-26 ENCOUNTER — Ambulatory Visit: Payer: Medicaid Other

## 2022-10-02 NOTE — Therapy (Unsigned)
OUTPATIENT PHYSICAL THERAPY TREATMENT NOTE   Patient Name: Monica Hunter MRN: [id number] DOB:[birth year], 42 y.o., female Today's Date: 10/03/2022  PCP: Orma Render, NP  REFERRING PROVIDER: Orma Render, NP   END OF SESSION:   PT End of Session - 10/03/22 1320     Visit Number 3    Number of Visits 7    Date for PT Re-Evaluation 10/24/22    Authorization Type [employer]    PT Start Time 1320    PT Stop Time [id number]    PT Time Calculation (min) 38 min    Activity Tolerance Patient tolerated treatment well    Behavior During Therapy WFL for tasks assessed/performed             Past Medical History:  Diagnosis Date   Asthma    Candidal dermatitis 11/04/2021   Cornea abrasion    Diabetes mellitus    Migraines    Morbid obesity    Past Surgical History:  Procedure Laterality Date   CERVICAL POLYPECTOMY     DILATION AND CURETTAGE OF UTERUS     Patient Active Problem List   Diagnosis Date Noted   Bilateral occipital neuralgia 08/13/2022   Chronic bilateral low back pain without sciatica 03/17/2022   Overflow incontinence of urine 12/03/2021   Type 2 diabetes mellitus with diabetic dermatitis, with long-term current use of insulin 11/04/2021   Oligomenorrhea 11/04/2021   Gastroesophageal reflux disease 11/04/2021   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   Asthma 02/14/2018   Class 3 severe obesity due to excess calories without serious comorbidity with body mass index (BMI) of 50.0 to 59.9 in adult 02/14/2018   Hidradenitis suppurativa 02/14/2018    REFERRING DIAG: M54.81 (ICD-10-CM) - Bilateral occipital neuralgia M54.50,G89.29 (ICD-10-CM) - Chronic bilateral low back pain without sciatica   THERAPY DIAG: M54.81 (ICD-10-CM) - Bilateral occipital neuralgia M54.50,G89.29 (ICD-10-CM) - Chronic bilateral low back pain without sciatica    Rationale for Evaluation and Treatment Rehabilitation  PERTINENT HISTORY: Monica Hunter also experiences neck pain and stiffness, which she  suspects may be contributing to her migraines. She has been diagnosed with occipital neuralgia and possesses a chiropractic report indicating degenerative disc disease, mild scoliosis on the left side, and a discrepancy in hip height, with her left hip being 24 millimeters lower than the right. Although she has not consulted a back specialist, these findings may be relevant to her condition.   PRECAUTIONS: none  SUBJECTIVE:                                                                                                                                                                                      SUBJECTIVE STATEMENT:  Overslept her last appointment due to side effects of Gabapentin.  Reports 1 day of soreness following last session.  Had some concerns about a "spine specialist" referral and provided phone number on referral in chart   PAIN:  Are you having pain? Yes: NPRS scale: 6-8/10 Pain location: neck and back Pain description: ache Aggravating factors: prolonged positioning Relieving factors: position changes   OBJECTIVE: (objective measures completed at initial evaluation unless otherwise dated)   DIAGNOSTIC FINDINGS:  None available   PATIENT SURVEYS:  FOTO 36(51 predicted)   SCREENING FOR RED FLAGS: negative   MUSCLE LENGTH: Hamstrings: Right 60 deg; Left 60 deg   POSTURE: rounded shoulders and forward head   PALPATION: Deferred due to body habitus   CERVICAL ROM:    Active ROM A/PROM (deg) 08/29/2022  Flexion 100%  Extension 75%  Right lateral flexion 75%  Left lateral flexion 90%  Right rotation 90%  Left rotation 100%   (Blank rows = not tested)   UE ROM: WNL   Active ROM Right 08/29/2022 Left 08/29/2022  Shoulder flexion      Shoulder extension      Shoulder abduction      Shoulder adduction      Shoulder extension      Shoulder internal rotation      Shoulder external rotation      Elbow flexion      Elbow extension      Wrist flexion       Wrist extension      Wrist ulnar deviation      Wrist radial deviation      Wrist pronation      Wrist supination       (Blank rows = not tested)   UE MMT: WNL   MMT Right 08/29/2022 Left 08/29/2022  Shoulder flexion      Shoulder extension      Shoulder abduction      Shoulder adduction      Shoulder extension      Shoulder internal rotation      Shoulder external rotation      Middle trapezius      Lower trapezius      Elbow flexion      Elbow extension      Wrist flexion      Wrist extension      Wrist ulnar deviation      Wrist radial deviation      Wrist pronation      Wrist supination      Grip strength       (Blank rows = not tested)   CERVICAL SPECIAL TESTS:  Neck flexor muscle endurance test: 24s     FUNCTIONAL TESTS:  5 times sit to stand: 16s with UE Support     LUMBAR ROM:    AROM eval  Flexion    Extension    Right lateral flexion    Left lateral flexion    Right rotation 90%  Left rotation 90%   (Blank rows = not tested)   LOWER EXTREMITY ROM:   WFL   Active  Right eval Left eval  Hip flexion      Hip extension      Hip abduction      Hip adduction      Hip internal rotation      Hip external rotation      Knee flexion      Knee extension      Ankle dorsiflexion  Ankle plantarflexion      Ankle inversion      Ankle eversion       (Blank rows = not tested)   LOWER EXTREMITY MMT:     MMT Right eval Left eval  Hip flexion 4- 4-  Hip extension 4- 4-  Hip abduction 4- 4-  Hip adduction      Hip internal rotation      Hip external rotation      Knee flexion      Knee extension 4- 4-  Ankle dorsiflexion      Ankle plantarflexion 4- 4-  Ankle inversion      Ankle eversion       (Blank rows = not tested)   LUMBAR SPECIAL TESTS:  Straight leg raise test: Negative, Slump test: Negative, and FABER test: deferred   GAIT: Distance walked: 5ftx2 Assistive device utilized: None Level of assistance: Complete  Independence Comments: unremarkable   TODAY'S TREATMENT:    [facility] Adult PT Treatment:                                                DATE: 10/03/22 Therapeutic Exercise: Nustep L2 8 min Seated hamstring stretch 30s x2 Seated sciatic stretch/glide (asymptomatic) Supine march 15/15 Supine bridge 15x Supine shoulder flexion 15/15 1# Supine SLR 15/15  Open book 10/10 90/90 30sx2     [facility] Adult PT Treatment:                                                DATE: 09/20/22 Therapeutic Exercise: Seated hamstring stretch 30s x2 Seated sciatic stretch/glide 10/10 Supine march 15/15 Supine bridge 15x Supine shoulder flexion 15/15 Supine SLR 15/15  Open book 10/10 Supine hor abduction YTB 15x Supine QL 30s x2 Bil Supine hip fallouts RTB 15x Bil, 15/15 unilaterally S/L clams RTB 15/15 Supine chin tucks 10x2 3s holdover 1/2 roll Supine chin tuck and rotations 10/10  90/90 30sx2                                                                                                                      DATE: 08/29/22 Eval       PATIENT EDUCATION:  Education details: Discussed eval findings, rehab rationale and POC and patient is in agreement  Person educated: Patient Education method: Explanation Education comprehension: verbalized understanding and needs further education   HOME EXERCISE PROGRAM: Access Code: [email or web address] URL: [email or web address] Date: 08/29/2022 Prepared by: Sharlynn Oliphant   Exercises - Seated Cervical Retraction  - 3 x daily - 5 x weekly - 2 sets - 10 reps - 3s hold - Supine Chin Tuck  - 3 x daily - 5 x weekly - 2 sets - 10 reps - 3s hold -  Supine Bridge  - 3 x daily - 5 x weekly - 2 sets - 10 reps - 3s hold - Sit to Stand with Arms Crossed  - 1 x daily - 5 x weekly - 1 sets - 5 reps   ASSESSMENT:   CLINICAL IMPRESSION: 24 hrs of soreness following last session but then symptoms resolved.  Able to initiate aerobic training as well as adding resistance to tasks  as noted.  Cued to pace and breathe during exercises.  HA symptoms marked improved regarding intensity and frequency.  Able to complete all tasks despite discomfort.  STGs addressed.  EVAL: Patient is a 42 y.o. female who was seen today for physical therapy evaluation and treatment for suboccipital neuralgia and low back pain w/o sciatica.  Patient presents with weakness in LE's and unable to stand w/o UE support.  Cervical mobility slightly limited in upper cervical spine.  BUE ROM and strength WNL, deep neck flexor strength functional.  Patient demonstrates a rounded shoulder and forward head posture impinging suboccipital nerves.   OBJECTIVE IMPAIRMENTS: decreased activity tolerance, decreased knowledge of condition, decreased mobility, decreased ROM, decreased strength, impaired perceived functional ability, impaired flexibility, postural dysfunction, obesity, and pain.    ACTIVITY LIMITATIONS: carrying, lifting, bending, sitting, standing, squatting, and sleeping   PERSONAL FACTORS: Fitness and 1 comorbidity: diabetes  are also affecting patient's functional outcome.    REHAB POTENTIAL: Good   CLINICAL DECISION MAKING: Evolving/moderate complexity   EVALUATION COMPLEXITY: Moderate     GOALS: Goals reviewed with patient? Yes   SHORT TERM GOALS: Target date: 09/19/2022   Patient to demonstrate independence in HEP  Baseline: [email or web address] Goal status: INITIAL   2.  Patient to perform single sit to stand w/o UE support Baseline: Requires UE support; 10/03/22 STS arms crossed 1x Goal status: Met   3.  Decrease HA intensity to 6/10 Baseline: 8/10; 10/03/22 4/10 Goal status: Met       LONG TERM GOALS: Target date: 10/10/2022     Increase B LE strength to 4/5 Baseline:  MMT Right eval Left eval  Hip flexion 4- 4-  Hip extension 4- 4-  Hip abduction 4- 4-  Hip adduction      Hip internal rotation      Hip external rotation      Knee flexion      Knee extension 4- 4-  Ankle  dorsiflexion      Ankle plantarflexion 4- 4-    Goal status: INITIAL   2.  Increase FOTO score to 51 Baseline: 36 Goal status: INITIAL   3.  Increase cervical AROM to 90% in deficit areas Baseline:  Active ROM A/PROM (deg) 08/29/2022  Flexion 100%  Extension 75%  Right lateral flexion 75%  Left lateral flexion 90%  Right rotation 90%  Left rotation 100%    Goal status: INITIAL   4.  Decrease HA intensity to 4/10 Baseline: 8/10 intensity Goal status: INITIAL   5.  70d SLR Bil Baseline: 60d SLR Bil Goal status: INITIAL       PLAN:   PT FREQUENCY: 1x/week   PT DURATION: 6 weeks   PLANNED INTERVENTIONS: Therapeutic exercises, Therapeutic activity, Neuromuscular re-education, Balance training, Gait training, Patient/Family education, Self Care, Joint mobilization, Dry Needling, Electrical stimulation, Spinal mobilization, Manual therapy, and Re-evaluation.   PLAN FOR NEXT SESSION: HEP review, LE flexibility tasks, core strength, postural exercises, aerobic work, manual as needed   Lanice Shirts, PT 10/03/2022, 1:59 PM

## 2022-10-03 ENCOUNTER — Ambulatory Visit: Payer: Medicare Other | Attending: Nurse Practitioner

## 2022-10-03 DIAGNOSIS — M6281 Muscle weakness (generalized): Secondary | ICD-10-CM | POA: Insufficient documentation

## 2022-10-03 DIAGNOSIS — M5481 Occipital neuralgia: Secondary | ICD-10-CM | POA: Insufficient documentation

## 2022-10-03 DIAGNOSIS — M5459 Other low back pain: Secondary | ICD-10-CM | POA: Diagnosis present

## 2022-10-09 NOTE — Therapy (Unsigned)
OUTPATIENT PHYSICAL THERAPY TREATMENT NOTE   Patient Name: Monica Hunter MRN: [id number] DOB:[birth year], 42 y.o., female Today's Date: 10/03/2022  PCP: Orma Render, NP  REFERRING PROVIDER: Orma Render, NP   END OF SESSION:   PT End of Session - 10/03/22 1320     Visit Number 3    Number of Visits 7    Date for PT Re-Evaluation 10/24/22    Authorization Type [employer]    PT Start Time 1320    PT Stop Time [id number]    PT Time Calculation (min) 38 min    Activity Tolerance Patient tolerated treatment well    Behavior During Therapy WFL for tasks assessed/performed             Past Medical History:  Diagnosis Date   Asthma    Candidal dermatitis 11/04/2021   Cornea abrasion    Diabetes mellitus    Migraines    Morbid obesity    Past Surgical History:  Procedure Laterality Date   CERVICAL POLYPECTOMY     DILATION AND CURETTAGE OF UTERUS     Patient Active Problem List   Diagnosis Date Noted   Bilateral occipital neuralgia 08/13/2022   Chronic bilateral low back pain without sciatica 03/17/2022   Overflow incontinence of urine 12/03/2021   Type 2 diabetes mellitus with diabetic dermatitis, with long-term current use of insulin 11/04/2021   Oligomenorrhea 11/04/2021   Gastroesophageal reflux disease 11/04/2021   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   Asthma 02/14/2018   Class 3 severe obesity due to excess calories without serious comorbidity with body mass index (BMI) of 50.0 to 59.9 in adult 02/14/2018   Hidradenitis suppurativa 02/14/2018    REFERRING DIAG: M54.81 (ICD-10-CM) - Bilateral occipital neuralgia M54.50,G89.29 (ICD-10-CM) - Chronic bilateral low back pain without sciatica   THERAPY DIAG: M54.81 (ICD-10-CM) - Bilateral occipital neuralgia M54.50,G89.29 (ICD-10-CM) - Chronic bilateral low back pain without sciatica    Rationale for Evaluation and Treatment Rehabilitation  PERTINENT HISTORY: Monica Hunter also experiences neck pain and stiffness, which she  suspects may be contributing to her migraines. She has been diagnosed with occipital neuralgia and possesses a chiropractic report indicating degenerative disc disease, mild scoliosis on the left side, and a discrepancy in hip height, with her left hip being 24 millimeters lower than the right. Although she has not consulted a back specialist, these findings may be relevant to her condition.   PRECAUTIONS: none  SUBJECTIVE:                                                                                                                                                                                      SUBJECTIVE STATEMENT:  Overslept her last appointment due to side effects of Gabapentin.  Reports 1 day of soreness following last session.  Had some concerns about a "spine specialist" referral and provided phone number on referral in chart   PAIN:  Are you having pain? Yes: NPRS scale: 6-8/10 Pain location: neck and back Pain description: ache Aggravating factors: prolonged positioning Relieving factors: position changes   OBJECTIVE: (objective measures completed at initial evaluation unless otherwise dated)   DIAGNOSTIC FINDINGS:  None available   PATIENT SURVEYS:  FOTO 36(51 predicted)   SCREENING FOR RED FLAGS: negative   MUSCLE LENGTH: Hamstrings: Right 60 deg; Left 60 deg   POSTURE: rounded shoulders and forward head   PALPATION: Deferred due to body habitus   CERVICAL ROM:    Active ROM A/PROM (deg) 08/29/2022  Flexion 100%  Extension 75%  Right lateral flexion 75%  Left lateral flexion 90%  Right rotation 90%  Left rotation 100%   (Blank rows = not tested)   UE ROM: WNL   Active ROM Right 08/29/2022 Left 08/29/2022  Shoulder flexion      Shoulder extension      Shoulder abduction      Shoulder adduction      Shoulder extension      Shoulder internal rotation      Shoulder external rotation      Elbow flexion      Elbow extension      Wrist flexion       Wrist extension      Wrist ulnar deviation      Wrist radial deviation      Wrist pronation      Wrist supination       (Blank rows = not tested)   UE MMT: WNL   MMT Right 08/29/2022 Left 08/29/2022  Shoulder flexion      Shoulder extension      Shoulder abduction      Shoulder adduction      Shoulder extension      Shoulder internal rotation      Shoulder external rotation      Middle trapezius      Lower trapezius      Elbow flexion      Elbow extension      Wrist flexion      Wrist extension      Wrist ulnar deviation      Wrist radial deviation      Wrist pronation      Wrist supination      Grip strength       (Blank rows = not tested)   CERVICAL SPECIAL TESTS:  Neck flexor muscle endurance test: 24s     FUNCTIONAL TESTS:  5 times sit to stand: 16s with UE Support     LUMBAR ROM:    AROM eval  Flexion    Extension    Right lateral flexion    Left lateral flexion    Right rotation 90%  Left rotation 90%   (Blank rows = not tested)   LOWER EXTREMITY ROM:   WFL   Active  Right eval Left eval  Hip flexion      Hip extension      Hip abduction      Hip adduction      Hip internal rotation      Hip external rotation      Knee flexion      Knee extension      Ankle dorsiflexion  Ankle plantarflexion      Ankle inversion      Ankle eversion       (Blank rows = not tested)   LOWER EXTREMITY MMT:     MMT Right eval Left eval  Hip flexion 4- 4-  Hip extension 4- 4-  Hip abduction 4- 4-  Hip adduction      Hip internal rotation      Hip external rotation      Knee flexion      Knee extension 4- 4-  Ankle dorsiflexion      Ankle plantarflexion 4- 4-  Ankle inversion      Ankle eversion       (Blank rows = not tested)   LUMBAR SPECIAL TESTS:  Straight leg raise test: Negative, Slump test: Negative, and FABER test: deferred   GAIT: Distance walked: 5ftx2 Assistive device utilized: None Level of assistance: Complete  Independence Comments: unremarkable   TODAY'S TREATMENT:    [facility] Adult PT Treatment:                                                DATE: 10/03/22 Therapeutic Exercise: Nustep L2 8 min Seated hamstring stretch 30s x2 Seated sciatic stretch/glide (asymptomatic) Supine march 15/15 Supine bridge 15x Supine shoulder flexion 15/15 1# Supine SLR 15/15  Open book 10/10 90/90 30sx2     [facility] Adult PT Treatment:                                                DATE: 09/20/22 Therapeutic Exercise: Seated hamstring stretch 30s x2 Seated sciatic stretch/glide 10/10 Supine march 15/15 Supine bridge 15x Supine shoulder flexion 15/15 Supine SLR 15/15  Open book 10/10 Supine hor abduction YTB 15x Supine QL 30s x2 Bil Supine hip fallouts RTB 15x Bil, 15/15 unilaterally S/L clams RTB 15/15 Supine chin tucks 10x2 3s holdover 1/2 roll Supine chin tuck and rotations 10/10  90/90 30sx2                                                                                                                      DATE: 08/29/22 Eval       PATIENT EDUCATION:  Education details: Discussed eval findings, rehab rationale and POC and patient is in agreement  Person educated: Patient Education method: Explanation Education comprehension: verbalized understanding and needs further education   HOME EXERCISE PROGRAM: Access Code: [email or web address] URL: [email or web address] Date: 08/29/2022 Prepared by: Sharlynn Oliphant   Exercises - Seated Cervical Retraction  - 3 x daily - 5 x weekly - 2 sets - 10 reps - 3s hold - Supine Chin Tuck  - 3 x daily - 5 x weekly - 2 sets - 10 reps - 3s hold -  Supine Bridge  - 3 x daily - 5 x weekly - 2 sets - 10 reps - 3s hold - Sit to Stand with Arms Crossed  - 1 x daily - 5 x weekly - 1 sets - 5 reps   ASSESSMENT:   CLINICAL IMPRESSION: 24 hrs of soreness following last session but then symptoms resolved.  Able to initiate aerobic training as well as adding resistance to tasks  as noted.  Cued to pace and breathe during exercises.  HA symptoms marked improved regarding intensity and frequency.  Able to complete all tasks despite discomfort.  STGs addressed.  EVAL: Patient is a 42 y.o. female who was seen today for physical therapy evaluation and treatment for suboccipital neuralgia and low back pain w/o sciatica.  Patient presents with weakness in LE's and unable to stand w/o UE support.  Cervical mobility slightly limited in upper cervical spine.  BUE ROM and strength WNL, deep neck flexor strength functional.  Patient demonstrates a rounded shoulder and forward head posture impinging suboccipital nerves.   OBJECTIVE IMPAIRMENTS: decreased activity tolerance, decreased knowledge of condition, decreased mobility, decreased ROM, decreased strength, impaired perceived functional ability, impaired flexibility, postural dysfunction, obesity, and pain.    ACTIVITY LIMITATIONS: carrying, lifting, bending, sitting, standing, squatting, and sleeping   PERSONAL FACTORS: Fitness and 1 comorbidity: diabetes  are also affecting patient's functional outcome.    REHAB POTENTIAL: Good   CLINICAL DECISION MAKING: Evolving/moderate complexity   EVALUATION COMPLEXITY: Moderate     GOALS: Goals reviewed with patient? Yes   SHORT TERM GOALS: Target date: 09/19/2022   Patient to demonstrate independence in HEP  Baseline: [email or web address] Goal status: INITIAL   2.  Patient to perform single sit to stand w/o UE support Baseline: Requires UE support; 10/03/22 STS arms crossed 1x Goal status: Met   3.  Decrease HA intensity to 6/10 Baseline: 8/10; 10/03/22 4/10 Goal status: Met       LONG TERM GOALS: Target date: 10/10/2022     Increase B LE strength to 4/5 Baseline:  MMT Right eval Left eval  Hip flexion 4- 4-  Hip extension 4- 4-  Hip abduction 4- 4-  Hip adduction      Hip internal rotation      Hip external rotation      Knee flexion      Knee extension 4- 4-  Ankle  dorsiflexion      Ankle plantarflexion 4- 4-    Goal status: INITIAL   2.  Increase FOTO score to 51 Baseline: 36 Goal status: INITIAL   3.  Increase cervical AROM to 90% in deficit areas Baseline:  Active ROM A/PROM (deg) 08/29/2022  Flexion 100%  Extension 75%  Right lateral flexion 75%  Left lateral flexion 90%  Right rotation 90%  Left rotation 100%    Goal status: INITIAL   4.  Decrease HA intensity to 4/10 Baseline: 8/10 intensity Goal status: INITIAL   5.  70d SLR Bil Baseline: 60d SLR Bil Goal status: INITIAL       PLAN:   PT FREQUENCY: 1x/week   PT DURATION: 6 weeks   PLANNED INTERVENTIONS: Therapeutic exercises, Therapeutic activity, Neuromuscular re-education, Balance training, Gait training, Patient/Family education, Self Care, Joint mobilization, Dry Needling, Electrical stimulation, Spinal mobilization, Manual therapy, and Re-evaluation.   PLAN FOR NEXT SESSION: HEP review, LE flexibility tasks, core strength, postural exercises, aerobic work, manual as needed   Lanice Shirts, PT 10/03/2022, 1:59 PM

## 2022-10-10 ENCOUNTER — Ambulatory Visit: Payer: Medicare Other

## 2022-10-10 DIAGNOSIS — M5459 Other low back pain: Secondary | ICD-10-CM

## 2022-10-10 DIAGNOSIS — M5481 Occipital neuralgia: Secondary | ICD-10-CM | POA: Diagnosis not present

## 2022-10-10 DIAGNOSIS — M6281 Muscle weakness (generalized): Secondary | ICD-10-CM

## 2022-10-17 ENCOUNTER — Ambulatory Visit: Payer: Medicare Other

## 2022-10-17 DIAGNOSIS — M6281 Muscle weakness (generalized): Secondary | ICD-10-CM

## 2022-10-17 DIAGNOSIS — M5481 Occipital neuralgia: Secondary | ICD-10-CM

## 2022-10-17 DIAGNOSIS — M5459 Other low back pain: Secondary | ICD-10-CM

## 2022-10-17 NOTE — Therapy (Signed)
OUTPATIENT PHYSICAL THERAPY TREATMENT NOTE   Patient Name: Monica Hunter MRN: [id number] DOB:1980/08/22, 42 y.o., female Today's Date: 10/17/2022  PCP: Tollie Eth, NP  REFERRING PROVIDER: Tollie Eth, NP   END OF SESSION:   PT End of Session - 10/17/22 1317     Visit Number 5    Number of Visits 7    Date for PT Re-Evaluation 10/24/22    Authorization Type [employer]    PT Start Time 1315    PT Stop Time 1355    PT Time Calculation (min) 40 min    Activity Tolerance Patient tolerated treatment well    Behavior During Therapy [facility] for tasks assessed/performed             Past Medical History:  Diagnosis Date   Asthma    Candidal dermatitis 11/04/2021   Cornea abrasion    Diabetes mellitus    Migraines    Morbid obesity    Past Surgical History:  Procedure Laterality Date   CERVICAL POLYPECTOMY     DILATION AND CURETTAGE OF UTERUS     Patient Active Problem List   Diagnosis Date Noted   Bilateral occipital neuralgia 08/13/2022   Chronic bilateral low back pain without sciatica 03/17/2022   Overflow incontinence of urine 12/03/2021   Type 2 diabetes mellitus with diabetic dermatitis, with long-term current use of insulin 11/04/2021   Oligomenorrhea 11/04/2021   Gastroesophageal reflux disease 11/04/2021   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   Asthma 02/14/2018   Class 3 severe obesity due to excess calories without serious comorbidity with body mass index (BMI) of 50.0 to 59.9 in adult 02/14/2018   Hidradenitis suppurativa 02/14/2018    REFERRING DIAG: M54.81 (ICD-10-CM) - Bilateral occipital neuralgia M54.50,G89.29 (ICD-10-CM) - Chronic bilateral low back pain without sciatica   THERAPY DIAG: M54.81 (ICD-10-CM) - Bilateral occipital neuralgia M54.50,G89.29 (ICD-10-CM) - Chronic bilateral low back pain without sciatica    Rationale for Evaluation and Treatment Rehabilitation  PERTINENT HISTORY: Monica Hunter also experiences neck pain and stiffness, which she  suspects may be contributing to her migraines. She has been diagnosed with occipital neuralgia and possesses a chiropractic report indicating degenerative disc disease, mild scoliosis on the left side, and a discrepancy in hip height, with her left hip being 24 millimeters lower than the right. Although she has not consulted a back specialist, these findings may be relevant to her condition.   PRECAUTIONS: none  SUBJECTIVE:                                                                                                                                                                                      SUBJECTIVE STATEMENT:  Feels neck is much better, no issues to address.  Low back is just sore.  Low back symptoms onset after prolonged activity.   PAIN:  Are you having pain? Yes: NPRS scale: 6-8/10 Pain location: neck and back Pain description: ache Aggravating factors: prolonged positioning Relieving factors: position changes   OBJECTIVE: (objective measures completed at initial evaluation unless otherwise dated)   DIAGNOSTIC FINDINGS:  None available   PATIENT SURVEYS:  FOTO 36(51 predicted)   SCREENING FOR RED FLAGS: negative   MUSCLE LENGTH: Hamstrings: Right 60 deg; Left 60 deg   POSTURE: rounded shoulders and forward head   PALPATION: Deferred due to body habitus   CERVICAL ROM:    Active ROM A/PROM (deg) 08/29/2022 4//11/24  Flexion 100% 90%  Extension 75% 90%  Right lateral flexion 75% 90%  Left lateral flexion 90% 90%  Right rotation 90% 75%  Left rotation 100% 100%   (Blank rows = not tested)   UE ROM: WNL   Active ROM Right 08/29/2022 Left 08/29/2022  Shoulder flexion      Shoulder extension      Shoulder abduction      Shoulder adduction      Shoulder extension      Shoulder internal rotation      Shoulder external rotation      Elbow flexion      Elbow extension      Wrist flexion      Wrist extension      Wrist ulnar deviation      Wrist  radial deviation      Wrist pronation      Wrist supination       (Blank rows = not tested)   UE MMT: WNL   MMT Right 08/29/2022 Left 08/29/2022  Shoulder flexion      Shoulder extension      Shoulder abduction      Shoulder adduction      Shoulder extension      Shoulder internal rotation      Shoulder external rotation      Middle trapezius      Lower trapezius      Elbow flexion      Elbow extension      Wrist flexion      Wrist extension      Wrist ulnar deviation      Wrist radial deviation      Wrist pronation      Wrist supination      Grip strength       (Blank rows = not tested)   CERVICAL SPECIAL TESTS:  Neck flexor muscle endurance test: 24s     FUNCTIONAL TESTS:  5 times sit to stand: 16s with UE Support     LUMBAR ROM:    AROM eval  Flexion    Extension    Right lateral flexion    Left lateral flexion    Right rotation 90%  Left rotation 90%   (Blank rows = not tested)   LOWER EXTREMITY ROM:   WFL   Active  Right eval Left eval  Hip flexion      Hip extension      Hip abduction      Hip adduction      Hip internal rotation      Hip external rotation      Knee flexion      Knee extension      Ankle dorsiflexion      Ankle plantarflexion  Ankle inversion      Ankle eversion       (Blank rows = not tested)   LOWER EXTREMITY MMT:     MMT Right eval Left eval  Hip flexion 4- 4-  Hip extension 4- 4-  Hip abduction 4- 4-  Hip adduction      Hip internal rotation      Hip external rotation      Knee flexion      Knee extension 4- 4-  Ankle dorsiflexion      Ankle plantarflexion 4- 4-  Ankle inversion      Ankle eversion       (Blank rows = not tested)   LUMBAR SPECIAL TESTS:  Straight leg raise test: Negative, Slump test: Negative, and FABER test: deferred   GAIT: Distance walked: 48ftx2 Assistive device utilized: None Level of assistance: Complete Independence Comments: unremarkable   TODAY'S TREATMENT:    [facility]  Adult PT Treatment:                                                DATE: 10/17/22 Therapeutic Exercise: Nustep L4 8 min Seated hamstring stretch 30s x2 PPT 3s hold 10x Supine march 15/15 with PPT Supine bridge 15x Supine SLR 15/15  Open book 10/10 Seated latissimus press 3s hold 10x Seated latissimus press with LAQs alt. 10/10    [facility] Adult PT Treatment:                                                DATE: 10/10/22 Therapeutic Exercise: Nustep L3 8 min Seated hamstring stretch 30s x2 Curl ups L1 15x PPT 3s hold 10x Supine march 10/10 with PPT Supine bridge 15x Supine shoulder flexion 15/15 1# Supine SLR 15/15  Open book 10/10 B cervical rotation with towel 10/10 along with extension 10x   [facility] Adult PT Treatment:                                                DATE: 10/03/22 Therapeutic Exercise: Nustep L2 8 min Seated hamstring stretch 30s x2 Seated sciatic stretch/glide (asymptomatic) Supine march 15/15 Supine bridge 15x Supine shoulder flexion 15/15 1# Supine SLR 15/15  Open book 10/10 90/90 30sx2     [facility] Adult PT Treatment:                                                DATE: 09/20/22 Therapeutic Exercise: Seated hamstring stretch 30s x2 Seated sciatic stretch/glide 10/10 Supine march 15/15 Supine bridge 15x Supine shoulder flexion 15/15 Supine SLR 15/15  Open book 10/10 Supine hor abduction YTB 15x Supine QL 30s x2 Bil Supine hip fallouts RTB 15x Bil, 15/15 unilaterally S/L clams RTB 15/15 Supine chin tucks 10x2 3s holdover 1/2 roll Supine chin tuck and rotations 10/10  90/90 30sx2  DATE: 08/29/22 Eval       PATIENT EDUCATION:  Education details: Discussed eval findings, rehab rationale and POC and patient is in agreement  Person educated: Patient Education method: Explanation Education comprehension: verbalized understanding and needs  further education   HOME EXERCISE PROGRAM: Access Code: [email or web address] URL: [email or web address] Date: 10/17/2022 Prepared by: Gustavus Bryant  Exercises - Supine Chin Tuck  - 2 x daily - 5 x weekly - 2 sets - 10 reps - 3s hold - Supine Bridge  - 2 x daily - 5 x weekly - 2 sets - 10 reps - 3s hold - Sit to Stand with Arms Crossed  - 2 x daily - 5 x weekly - 1 sets - 5 reps - Curl Up with Reach  - 2 x daily - 5 x weekly - 2 sets - 10 reps - Supine March  - 2 x daily - 5 x weekly - 2 sets - 10 reps   ASSESSMENT:   CLINICAL IMPRESSION: Neck and cervical symptom have resolved essentially.  Low back only onsets after prolonged activities suggesting endurance and strength deficits in core.  Incorporated additional abdominal exercises and increased reps. Introduced seated core tasks to address trunk weakness.  EVAL: Patient is a 42 y.o. female who was seen today for physical therapy evaluation and treatment for suboccipital neuralgia and low back pain w/o sciatica.  Patient presents with weakness in LE's and unable to stand w/o UE support.  Cervical mobility slightly limited in upper cervical spine.  BUE ROM and strength WNL, deep neck flexor strength functional.  Patient demonstrates a rounded shoulder and forward head posture impinging suboccipital nerves.   OBJECTIVE IMPAIRMENTS: decreased activity tolerance, decreased knowledge of condition, decreased mobility, decreased ROM, decreased strength, impaired perceived functional ability, impaired flexibility, postural dysfunction, obesity, and pain.    ACTIVITY LIMITATIONS: carrying, lifting, bending, sitting, standing, squatting, and sleeping   PERSONAL FACTORS: Fitness and 1 comorbidity: diabetes  are also affecting patient's functional outcome.    REHAB POTENTIAL: Good   CLINICAL DECISION MAKING: Evolving/moderate complexity   EVALUATION COMPLEXITY: Moderate     GOALS: Goals reviewed with patient? Yes   SHORT TERM GOALS:  Target date: 09/19/2022   Patient to demonstrate independence in HEP  Baseline: [email or web address] Goal status: Met   2.  Patient to perform single sit to stand w/o UE support Baseline: Requires UE support; 10/03/22 STS arms crossed 1x Goal status: Met   3.  Decrease HA intensity to 6/10 Baseline: 8/10; 10/03/22 4/10 Goal status: Met       LONG TERM GOALS: Target date: 10/10/2022     Increase B LE strength to 4/5 Baseline:  MMT Right eval Left eval  Hip flexion 4- 4-  Hip extension 4- 4-  Hip abduction 4- 4-  Hip adduction      Hip internal rotation      Hip external rotation      Knee flexion      Knee extension 4- 4-  Ankle dorsiflexion      Ankle plantarflexion 4- 4-    Goal status: INITIAL   2.  Increase FOTO score to 51 Baseline: 36 Goal status: INITIAL   3.  Increase cervical AROM to 90% in deficit areas Baseline:  Active ROM A/PROM (deg) 08/29/2022  Flexion 100%  Extension 75%  Right lateral flexion 75%  Left lateral flexion 90%  Right rotation 90%  Left rotation 100%    Goal status: INITIAL   4.  Decrease HA intensity to  4/10 Baseline: 8/10 intensity Goal status: INITIAL   5.  70d SLR Bil Baseline: 60d SLR Bil Goal status: INITIAL       PLAN:   PT FREQUENCY: 1x/week   PT DURATION: 6 weeks   PLANNED INTERVENTIONS: Therapeutic exercises, Therapeutic activity, Neuromuscular re-education, Balance training, Gait training, Patient/Family education, Self Care, Joint mobilization, Dry Needling, Electrical stimulation, Spinal mobilization, Manual therapy, and Re-evaluation.   PLAN FOR NEXT SESSION: HEP review, LE flexibility tasks, core strength, postural exercises, aerobic work, manual as needed   Hildred Laser, PT 10/17/2022, 1:56 PM Check all possible CPT codes: 11914 - PT Re-evaluation, 97110- Therapeutic Exercise, [phone]- Neuro Re-education, [phone] - Gait Training, [phone] - Manual Therapy, [phone] - Therapeutic Activities, and [phone] - Self Care    Check  all conditions that are expected to impact treatment: Morbid obesity, Respiratory disorders, and Diabetes mellitus   If treatment provided at initial evaluation, no treatment charged due to lack of authorization.

## 2022-10-23 ENCOUNTER — Encounter: Payer: Self-pay | Admitting: Allergy

## 2022-10-23 ENCOUNTER — Encounter: Payer: Self-pay | Admitting: Nurse Practitioner

## 2022-10-23 ENCOUNTER — Ambulatory Visit (INDEPENDENT_AMBULATORY_CARE_PROVIDER_SITE_OTHER): Payer: Medicare Other | Admitting: Allergy

## 2022-10-23 VITALS — BP 122/72 | HR 86 | Temp 98.2°F | Resp 18

## 2022-10-23 DIAGNOSIS — L508 Other urticaria: Secondary | ICD-10-CM | POA: Diagnosis not present

## 2022-10-23 DIAGNOSIS — L299 Pruritus, unspecified: Secondary | ICD-10-CM | POA: Diagnosis not present

## 2022-10-23 DIAGNOSIS — J4540 Moderate persistent asthma, uncomplicated: Secondary | ICD-10-CM | POA: Diagnosis not present

## 2022-10-23 DIAGNOSIS — H1013 Acute atopic conjunctivitis, bilateral: Secondary | ICD-10-CM

## 2022-10-23 DIAGNOSIS — L2481 Irritant contact dermatitis due to metals: Secondary | ICD-10-CM

## 2022-10-23 DIAGNOSIS — J310 Chronic rhinitis: Secondary | ICD-10-CM | POA: Diagnosis not present

## 2022-10-23 MED ORDER — ALBUTEROL SULFATE HFA 108 (90 BASE) MCG/ACT IN AERS: 2.0000 | INHALATION_SPRAY | Freq: Four times a day (QID) | RESPIRATORY_TRACT | 1 refills | Status: DC | PRN

## 2022-10-23 MED ORDER — BUDESONIDE-FORMOTEROL FUMARATE 160-4.5 MCG/ACT IN AERO: 2.0000 | INHALATION_SPRAY | Freq: Two times a day (BID) | RESPIRATORY_TRACT | 5 refills | Status: DC

## 2022-10-23 MED ORDER — ALBUTEROL SULFATE (2.5 MG/3ML) 0.083% IN NEBU: 2.5000 mg | INHALATION_SOLUTION | RESPIRATORY_TRACT | 1 refills | Status: DC | PRN

## 2022-10-23 MED ORDER — LEVOCETIRIZINE DIHYDROCHLORIDE 5 MG PO TABS: 5.0000 mg | ORAL_TABLET | Freq: Every evening | ORAL | 5 refills | Status: DC

## 2022-10-23 MED ORDER — MONTELUKAST SODIUM 10 MG PO TABS: 10.0000 mg | ORAL_TABLET | Freq: Every day | ORAL | 2 refills | Status: DC

## 2022-10-23 MED ORDER — FAMOTIDINE 20 MG PO TABS: 20.0000 mg | ORAL_TABLET | Freq: Two times a day (BID) | ORAL | 2 refills | Status: DC

## 2022-10-23 MED ORDER — AZELASTINE-FLUTICASONE 137-50 MCG/ACT NA SUSP: 1.0000 | Freq: Two times a day (BID) | NASAL | 5 refills | Status: DC | PRN

## 2022-10-23 MED ORDER — CROMOLYN SODIUM 4 % OP SOLN: 2.0000 [drp] | Freq: Four times a day (QID) | OPHTHALMIC | 5 refills | Status: DC | PRN

## 2022-10-23 MED ORDER — HYDROXYZINE HCL 25 MG PO TABS: 25.0000 mg | ORAL_TABLET | Freq: Three times a day (TID) | ORAL | 3 refills | Status: DC | PRN

## 2022-10-23 NOTE — Patient Instructions (Addendum)
-   at this time etiology of hives and itching is spontaneous.  Hives can be caused by a variety of different triggers including illness/infection, foods, medications, stings, exercise, pressure, vibrations, extremes of temperature to name a few however majority of the time there is no identifiable trigger.    - when able recommend getting labs done for hives: tryptase, hive panel, environmental panel, alpha-gal panel, inflammatory markers - for hive control recommend you start Xyzal  1 tab twice a day with Pepcid  1 tab twice a day.   Keep your Singulair daily.  - consider Xolair monthly injections if above regimen is not effective enough.  Xolair information provided.  - can use your hydroxyzine as needed for breakthrough symptoms  - Daily controller medication(s): Singulair  daily and Symbicort 160/4.72mcg two puffs twice daily with spacer - Prior to physical activity: albuterol 2 puffs 10-15 minutes before physical activity. - Rescue medications: albuterol 2 puffs or albuterol 1 vial via nebulizer every 4-6 hours as needed  - Asthma control goals:  * Full participation in all desired activities (may need albuterol before activity) * Albuterol use two time or less a week on average (not counting use with activity) * Cough interfering with sleep two time or less a month * Oral steroids no more than once a year * No hospitalizations  - as above obtaining environmental allergy panel - use Xyzal as above for generalized allergy symptoms - for itchy/watery eyes use Cromolyn 1 drop each eye up to 4 times a day as needed - for nasal congestion/drainage can use Dymista 1 sprays each nostril twice a day for congestion or nasal drainage.  Sample provided.   - avoid jewelry that cause rash.  - continue Hydrocortisone twice a day to neck area until resolved - keep skin moisturized.    Follow-up in 3-4 months or sooner if needed

## 2022-10-23 NOTE — Progress Notes (Signed)
Follow-up Note  RE: Monica Hunter MRN: [id number] DOB: 1981-03-10 Date of Office Visit: 10/23/2022   History of present illness: Monica Hunter is a 42 y.o. female presenting today for follow-up of urticaria with angioedema, asthma and rhinoconjunctivitis.  She was last seen in the office on 07/24/22 by myself.  She has had a change in her insurance and also switched pharmacies. With all of this she states a lot of the medications I recommended at last visit she did not get.   In regards to the hives she continues to have hives but states mostly occurring in particular areas and not full body.  She still uses hydroxyzine which does help.  She did not get the allegra or pepcid to use.  She is taking singulair daily.  For her asthma she continues to have symptoms daily and is needed her rescue medications either by inhaler or nebulizer daily if not multiple times a day.   She did not get the Symbicort.  The spacer she states via the pharmacy was too expensive thus not able to get this either.   With her allergies she is still struggling with congestion, headaches, itchy/watery eyes, sneezing.   She states her friend put a silver necklace around her neck  about 3 weeks ago.  She took it off relatively quickly but she started developing a rash that started in one side of neck and is spreading around the neck.  Rash is itchy.  She uses aloe vera as well as applying HC.   She has not been able to get the labwork done as she has a outstanding bill with [facility].  Review of systems: Review of Systems  Constitutional: Negative.   HENT:         See HPI  Eyes:        See HPI  Respiratory:         See HPI  Cardiovascular: Negative.   Gastrointestinal: Negative.   Musculoskeletal: Negative.   Skin:        See HPI  Allergic/Immunologic: Negative.   Neurological: Negative.      All other systems negative unless noted above in HPI  Past medical/social/surgical/family history have been  reviewed and are unchanged unless specifically indicated below.  No changes  Medication List: Current Outpatient Medications  Medication Sig Dispense Refill   budesonide-formoterol (SYMBICORT) 160-4.5 MCG/ACT inhaler Inhale 2 puffs into the lungs 2 (two) times daily. 1 each 5   diclofenac (VOLTAREN) 50 MG EC tablet Take 50 mg by mouth as needed.     fluconazole (DIFLUCAN) 150 MG tablet Take one tablet every 3 days. 6 tablet 2   fluoruracil (CARAC) 0.5 % cream Apply topically daily. Apply 1 drop of salicylic acid with pea sized amount of fluorouracil to the top of the wart only daily using q-tip. Allow to dry and cover with bandage. 30 g 0   gabapentin (NEURONTIN) 300 MG capsule Take 1 capsule (300 mg total) by mouth at bedtime. 30 capsule 3   norethindrone (MICRONOR) 0.35 MG tablet Take 1 tablet (0.35 mg total) by mouth daily. 84 tablet 1   nystatin ointment (MYCOSTATIN) Apply 1 application. topically 2 (two) times daily. 30 g 6   ondansetron (ZOFRAN-ODT) 4 MG disintegrating tablet Take 1 tablet (4 mg total) by mouth every 8 (eight) hours as needed for nausea or vomiting. 20 tablet 0   Salicylic Acid (SALICYLIC ACID WART REMOVER) 27.5 % LIQD Apply 1 drop of salicylic acid with pea sized amount  of fluorouracil to the top of the wart only daily using q-tip. Allow to dry and cover with bandage. 10 mL 1   SUMAtriptan (IMITREX) 100 MG tablet Take 100 mg by mouth as needed for migraine. May repeat in 2 hours if headache persists or recurs.     triamcinolone cream (KENALOG) 0.1 % Apply 1 application. topically 2 (two) times daily. To affected area(s) as needed 30 g 3   valACYclovir (VALTREX) 1000 MG tablet Take 1 tablet (1,000 mg total) by mouth daily. 30 tablet 6   Vitamins A & D (VITAMIN A & D) ointment Apply 1 application. topically as needed for dry skin. Apply twice a day to rectum and vaginal area with nystatin cream. 45 g 3   albuterol (PROVENTIL) (2.5 MG/3ML) 0.083% nebulizer solution Take 3 mLs  (2.5 mg total) by nebulization every 4 (four) hours as needed for wheezing or shortness of breath. 75 mL 1   albuterol (VENTOLIN HFA) 108 (90 Base) MCG/ACT inhaler Inhale 2 puffs into the lungs every 6 (six) hours as needed for wheezing or shortness of breath. 18 g 1   Continuous Blood Gluc Receiver (DEXCOM G7 RECEIVER) DEVI Use to check BG (Patient not taking: Reported on 10/23/2022) 1 each 0   Continuous Blood Gluc Sensor (DEXCOM G7 SENSOR) MISC Apply one sensor to the abdomen every 10 days. (Patient not taking: Reported on 10/23/2022) 3 each 11   famotidine (PEPCID) 20 MG tablet Take 1 tablet (20 mg total) by mouth 2 (two) times daily. 60 tablet 2   hydrOXYzine (ATARAX) 25 MG tablet Take 1 tablet (25 mg total) by mouth every 8 (eight) hours as needed. 90 tablet 3   montelukast (SINGULAIR) 10 MG tablet Take 1 tablet (10 mg total) by mouth at bedtime. 30 tablet 2   Semaglutide (OZEMPIC, 0.25 OR 0.5 MG/DOSE, [place]) Inject 0.5 mg into the skin once a week. (Patient not taking: Reported on 10/23/2022)     Spacer/Aero-Holding Chambers (AEROCHAMBER PLUS WITH MASK) inhaler Use as directed with Metered Dose Inhaler (Patient not taking: Reported on 09/05/2022) 1 each 1   No current facility-administered medications for this visit.     Known medication allergies: Allergies  Allergen Reactions   Ceftriaxone Hives and Itching   Methocarbamol Itching and Rash   Sulfa Antibiotics Other (See Comments), Hives, Itching and Rash    Reaction unknown     Physical examination: Blood pressure 122/72, pulse 86, temperature 98.2 F (36.8 C), temperature source Temporal, resp. rate 18, SpO2 97 %.  General: Alert, interactive, in no acute distress. HEENT: PERRLA, TMs pearly gray, turbinates moderately edematous without discharge, post-pharynx non erythematous. Neck: Supple without lymphadenopathy. Lungs: Mildly decreased breath sounds bilaterally without wheezing, rhonchi or rales. {no increased work of  breathing. CV: Normal S1, S2 without murmurs. Abdomen: Nondistended, nontender. Skin: Hyperpigmented macules in patches around the anterior neck . Extremities:  No clubbing, cyanosis or edema. Neuro:   Grossly intact.  Diagnositics/Labs: None today   Assessment and plan: Asthma Chronic urticaria with angioedema Rhinoconjunctivitis Contact dermatitis    - at this time etiology of hives and itching is spontaneous.  Hives can be caused by a variety of different triggers including illness/infection, foods, medications, stings, exercise, pressure, vibrations, extremes of temperature to name a few however majority of the time there is no identifiable trigger.    - when able recommend getting labs done for hives: tryptase, hive panel, environmental panel, alpha-gal panel, inflammatory markers - for hive control recommend you start  Xyzal 5mg  1 tab twice a day with Pepcid 20mg  1 tab twice a day.   Keep your Singulair daily.  - consider Xolair monthly injections if above regimen is not effective enough.  Xolair information provided.  - can use your hydroxyzine as needed for breakthrough symptoms  - Daily controller medication(s): Singulair 10mg  daily and Symbicort 160/4.71mcg two puffs twice daily with spacer - Prior to physical activity: albuterol 2 puffs 10-15 minutes before physical activity. - Rescue medications: albuterol 2 puffs or albuterol 1 vial via nebulizer every 4-6 hours as needed - Nebulizer provided 07/24/2022 - Spacer provided today  - Asthma control goals:  * Full participation in all desired activities (may need albuterol before activity) * Albuterol use two time or less a week on average (not counting use with activity) * Cough interfering with sleep two time or less a month * Oral steroids no more than once a year * No hospitalizations  - as above obtaining environmental allergy panel - use Xyzal as above for generalized allergy symptoms - for itchy/watery eyes use  Cromolyn 1 drop each eye up to 4 times a day as needed - for nasal congestion/drainage can use Dymista 1 sprays each nostril twice a day for congestion or nasal drainage.  Sample provided.   - avoid jewelry that cause rash.  - continue Hydrocortisone twice a day to neck area until resolved - keep skin moisturized.    Follow-up in 3-4 months or sooner if needed  I appreciate the opportunity to take part in Monica Hunter's care. Please do not hesitate to contact me with questions.  Sincerely,   Margo Aye, MD Allergy/Immunology [facility]

## 2022-10-24 ENCOUNTER — Ambulatory Visit: Payer: Medicare Other

## 2022-10-24 DIAGNOSIS — M5481 Occipital neuralgia: Secondary | ICD-10-CM

## 2022-10-24 DIAGNOSIS — M6281 Muscle weakness (generalized): Secondary | ICD-10-CM

## 2022-10-24 DIAGNOSIS — M5459 Other low back pain: Secondary | ICD-10-CM

## 2022-10-24 NOTE — Therapy (Signed)
OUTPATIENT PHYSICAL THERAPY TREATMENT NOTE   Patient Name: Monica Hunter MRN: [id number] DOB:[birth year], 42 y.o., female Today's Date: 10/24/2022  PCP: Tollie Eth, NP  REFERRING PROVIDER: Tollie Eth, NP   END OF SESSION:   PT End of Session - 10/24/22 1327     Visit Number 6    Number of Visits 7    Date for PT Re-Evaluation 10/24/22    Authorization Type [employer]    PT Start Time 1327   patient arrived late   PT Stop Time 1357    PT Time Calculation (min) 30 min    Activity Tolerance Patient tolerated treatment well    Behavior During Therapy [facility] for tasks assessed/performed              Past Medical History:  Diagnosis Date   Asthma    Candidal dermatitis 11/04/2021   Cornea abrasion    Diabetes mellitus    Migraines    Morbid obesity    Past Surgical History:  Procedure Laterality Date   CERVICAL POLYPECTOMY     DILATION AND CURETTAGE OF UTERUS     Patient Active Problem List   Diagnosis Date Noted   Bilateral occipital neuralgia 08/13/2022   Chronic bilateral low back pain without sciatica 03/17/2022   Overflow incontinence of urine 12/03/2021   Type 2 diabetes mellitus with diabetic dermatitis, with long-term current use of insulin 11/04/2021   Oligomenorrhea 11/04/2021   Gastroesophageal reflux disease 11/04/2021   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   Asthma 02/14/2018   Class 3 severe obesity due to excess calories without serious comorbidity with body mass index (BMI) of 50.0 to 59.9 in adult 02/14/2018   Hidradenitis suppurativa 02/14/2018    REFERRING DIAG: M54.81 (ICD-10-CM) - Bilateral occipital neuralgia M54.50,G89.29 (ICD-10-CM) - Chronic bilateral low back pain without sciatica   THERAPY DIAG: M54.81 (ICD-10-CM) - Bilateral occipital neuralgia M54.50,G89.29 (ICD-10-CM) - Chronic bilateral low back pain without sciatica    Rationale for Evaluation and Treatment Rehabilitation  PERTINENT HISTORY: Monica Hunter also experiences neck pain and  stiffness, which she suspects may be contributing to her migraines. She has been diagnosed with occipital neuralgia and possesses a chiropractic report indicating degenerative disc disease, mild scoliosis on the left side, and a discrepancy in hip height, with her left hip being 24 millimeters lower than the right. Although she has not consulted a back specialist, these findings may be relevant to her condition.   PRECAUTIONS: none  SUBJECTIVE:  SUBJECTIVE STATEMENT:  Neck and back symptoms have been minimal.  Main concern is shoulder and chest wall pain following recent breathing treatment    PAIN:  Are you having pain? Yes: NPRS scale: 6-8/10 Pain location: neck and back Pain description: ache Aggravating factors: prolonged positioning Relieving factors: position changes   OBJECTIVE: (objective measures completed at initial evaluation unless otherwise dated)   DIAGNOSTIC FINDINGS:  None available   PATIENT SURVEYS:  FOTO 36(51 predicted)   SCREENING FOR RED FLAGS: negative   MUSCLE LENGTH: Hamstrings: Right 60 deg; Left 60 deg   POSTURE: rounded shoulders and forward head   PALPATION: Deferred due to body habitus   CERVICAL ROM:    Active ROM A/PROM (deg) 08/29/2022 4//11/24  Flexion 100% 90%  Extension 75% 90%  Right lateral flexion 75% 90%  Left lateral flexion 90% 90%  Right rotation 90% 75%  Left rotation 100% 100%   (Blank rows = not tested)   UE ROM: WNL   Active ROM Right 08/29/2022 Left 08/29/2022  Shoulder flexion      Shoulder extension      Shoulder abduction      Shoulder adduction      Shoulder extension      Shoulder internal rotation      Shoulder external rotation      Elbow flexion      Elbow extension      Wrist flexion      Wrist extension      Wrist ulnar  deviation      Wrist radial deviation      Wrist pronation      Wrist supination       (Blank rows = not tested)   UE MMT: WNL   MMT Right 08/29/2022 Left 08/29/2022  Shoulder flexion      Shoulder extension      Shoulder abduction      Shoulder adduction      Shoulder extension      Shoulder internal rotation      Shoulder external rotation      Middle trapezius      Lower trapezius      Elbow flexion      Elbow extension      Wrist flexion      Wrist extension      Wrist ulnar deviation      Wrist radial deviation      Wrist pronation      Wrist supination      Grip strength       (Blank rows = not tested)   CERVICAL SPECIAL TESTS:  Neck flexor muscle endurance test: 24s     FUNCTIONAL TESTS:  5 times sit to stand: 16s with UE Support     LUMBAR ROM:    AROM eval  Flexion    Extension    Right lateral flexion    Left lateral flexion    Right rotation 90%  Left rotation 90%   (Blank rows = not tested)   LOWER EXTREMITY ROM:   WFL   Active  Right eval Left eval  Hip flexion      Hip extension      Hip abduction      Hip adduction      Hip internal rotation      Hip external rotation      Knee flexion      Knee extension      Ankle dorsiflexion      Ankle plantarflexion  Ankle inversion      Ankle eversion       (Blank rows = not tested)   LOWER EXTREMITY MMT:     MMT Right eval Left eval  Hip flexion 4- 4-  Hip extension 4- 4-  Hip abduction 4- 4-  Hip adduction      Hip internal rotation      Hip external rotation      Knee flexion      Knee extension 4- 4-  Ankle dorsiflexion      Ankle plantarflexion 4- 4-  Ankle inversion      Ankle eversion       (Blank rows = not tested)   LUMBAR SPECIAL TESTS:  Straight leg raise test: Negative, Slump test: Negative, and FABER test: deferred   GAIT: Distance walked: 94ftx2 Assistive device utilized: None Level of assistance: Complete Independence Comments: unremarkable   TODAY'S  TREATMENT:    [facility] Adult PT Treatment:                                                DATE: 10/24/22 Therapeutic Exercise: Nustep L4 8 min Seated hamstring stretch 30s x2 Curl up with reach 15x Supine march 15/15 with PPT 2.5# Supine SLR 15/15 2.5# Open book 10/10 Seated latissimus press with LAQs alt. 10/10  [facility] Adult PT Treatment:                                                DATE: 10/17/22 Therapeutic Exercise: Nustep L4 8 min Seated hamstring stretch 30s x2 PPT 3s hold 10x Supine march 15/15 with PPT Supine bridge 15x Supine SLR 15/15  Open book 10/10 Seated latissimus press 3s hold 10x Seated latissimus press with LAQs alt. 10/10    [facility] Adult PT Treatment:                                                DATE: 10/10/22 Therapeutic Exercise: Nustep L3 8 min Seated hamstring stretch 30s x2 Curl ups L1 15x PPT 3s hold 10x Supine march 10/10 with PPT Supine bridge 15x Supine shoulder flexion 15/15 1# Supine SLR 15/15  Open book 10/10 B cervical rotation with towel 10/10 along with extension 10x   [facility] Adult PT Treatment:                                                DATE: 10/03/22 Therapeutic Exercise: Nustep L2 8 min Seated hamstring stretch 30s x2 Seated sciatic stretch/glide (asymptomatic) Supine march 15/15 Supine bridge 15x Supine shoulder flexion 15/15 1# Supine SLR 15/15  Open book 10/10 90/90 30sx2     [facility] Adult PT Treatment:                                                DATE: 09/20/22 Therapeutic Exercise:  Seated hamstring stretch 30s x2 Seated sciatic stretch/glide 10/10 Supine march 15/15 Supine bridge 15x Supine shoulder flexion 15/15 Supine SLR 15/15  Open book 10/10 Supine hor abduction YTB 15x Supine QL 30s x2 Bil Supine hip fallouts RTB 15x Bil, 15/15 unilaterally S/L clams RTB 15/15 Supine chin tucks 10x2 3s holdover 1/2 roll Supine chin tuck and rotations 10/10  90/90 30sx2                                                                                                                       DATE: 08/29/22 Eval       PATIENT EDUCATION:  Education details: Discussed eval findings, rehab rationale and POC and patient is in agreement  Person educated: Patient Education method: Explanation Education comprehension: verbalized understanding and needs further education   HOME EXERCISE PROGRAM: Access Code: [email or web address] URL: [email or web address] Date: 10/17/2022 Prepared by: Gustavus Bryant  Exercises - Supine Chin Tuck  - 2 x daily - 5 x weekly - 2 sets - 10 reps - 3s hold - Supine Bridge  - 2 x daily - 5 x weekly - 2 sets - 10 reps - 3s hold - Sit to Stand with Arms Crossed  - 2 x daily - 5 x weekly - 1 sets - 5 reps - Curl Up with Reach  - 2 x daily - 5 x weekly - 2 sets - 10 reps - Supine March  - 2 x daily - 5 x weekly - 2 sets - 10 reps   ASSESSMENT:   CLINICAL IMPRESSION: Overall much improved, feel she is ready to transition to self care following next treatment.  Pain goals met.  Added additional core tasks today to strengthen abdominals. Added weights and resistance as noted.   EVAL: Patient is a 42 y.o. female who was seen today for physical therapy evaluation and treatment for suboccipital neuralgia and low back pain w/o sciatica.  Patient presents with weakness in LE's and unable to stand w/o UE support.  Cervical mobility slightly limited in upper cervical spine.  BUE ROM and strength WNL, deep neck flexor strength functional.  Patient demonstrates a rounded shoulder and forward head posture impinging suboccipital nerves.   OBJECTIVE IMPAIRMENTS: decreased activity tolerance, decreased knowledge of condition, decreased mobility, decreased ROM, decreased strength, impaired perceived functional ability, impaired flexibility, postural dysfunction, obesity, and pain.    ACTIVITY LIMITATIONS: carrying, lifting, bending, sitting, standing, squatting, and sleeping   PERSONAL FACTORS: Fitness and 1  comorbidity: diabetes  are also affecting patient's functional outcome.    REHAB POTENTIAL: Good   CLINICAL DECISION MAKING: Evolving/moderate complexity   EVALUATION COMPLEXITY: Moderate     GOALS: Goals reviewed with patient? Yes   SHORT TERM GOALS: Target date: 09/19/2022   Patient to demonstrate independence in HEP  Baseline: [id number] Goal status: Met   2.  Patient to perform single sit to stand w/o UE support Baseline: Requires UE support; 10/03/22 STS arms crossed 1x Goal status: Met   3.  Decrease  HA intensity to 6/10 Baseline: 8/10; 10/03/22 4/10 Goal status: Met       LONG TERM GOALS: Target date: 10/10/2022     Increase B LE strength to 4/5 Baseline:  MMT Right eval Left eval  Hip flexion 4- 4-  Hip extension 4- 4-  Hip abduction 4- 4-  Hip adduction      Hip internal rotation      Hip external rotation      Knee flexion      Knee extension 4- 4-  Ankle dorsiflexion      Ankle plantarflexion 4- 4-    Goal status: INITIAL   2.  Increase FOTO score to 51 Baseline: 36 Goal status: INITIAL   3.  Increase cervical AROM to 90% in deficit areas Baseline:  Active ROM A/PROM (deg) 08/29/2022 4//11/24  Flexion 100% 90%  Extension 75% 90%  Right lateral flexion 75% 90%  Left lateral flexion 90% 90%  Right rotation 90% 75%  Left rotation 100% 100%    Goal status: Ongoing   4.  Decrease HA intensity to 4/10 Baseline: 8/10 intensity; 10/24/22 1/10 Goal status: Met   5.  70d SLR Bil Baseline: 60d SLR Bil Goal status: INITIAL       PLAN:   PT FREQUENCY: 1x/week   PT DURATION: 6 weeks   PLANNED INTERVENTIONS: Therapeutic exercises, Therapeutic activity, Neuromuscular re-education, Balance training, Gait training, Patient/Family education, Self Care, Joint mobilization, Dry Needling, Electrical stimulation, Spinal mobilization, Manual therapy, and Re-evaluation.   PLAN FOR NEXT SESSION: HEP review, LE flexibility tasks, core strength, postural  exercises, aerobic work, manual as needed   Hildred Laser, PT 10/24/2022, 2:00 PM Check all possible CPT codes: 16109 - PT Re-evaluation, 97110- Therapeutic Exercise, [phone]- Neuro Re-education, [phone] - Gait Training, [phone] - Manual Therapy, [phone] - Therapeutic Activities, and [phone] - Self Care    Check all conditions that are expected to impact treatment: Morbid obesity, Respiratory disorders, and Diabetes mellitus   If treatment provided at initial evaluation, no treatment charged due to lack of authorization.

## 2022-10-25 ENCOUNTER — Telehealth: Payer: Self-pay

## 2022-10-25 NOTE — Telephone Encounter (Signed)
PA request received via CMM for Azelastine-Fluticasone 137-50MCG/ACT suspension  PA not submitted due to [employer] being covered under [employer] at this time

## 2022-10-27 ENCOUNTER — Other Ambulatory Visit: Payer: Self-pay | Admitting: Nurse Practitioner

## 2022-10-27 DIAGNOSIS — Z794 Long term (current) use of insulin: Secondary | ICD-10-CM

## 2022-10-27 MED ORDER — OZEMPIC (0.25 OR 0.5 MG/DOSE) 2 MG/3ML ~~LOC~~ SOPN
0.5000 mg | PEN_INJECTOR | SUBCUTANEOUS | 0 refills | Status: DC
Start: 2022-10-27 — End: 2022-12-26

## 2022-10-31 ENCOUNTER — Ambulatory Visit: Payer: Medicare Other | Attending: Nurse Practitioner

## 2022-10-31 ENCOUNTER — Other Ambulatory Visit: Payer: Self-pay | Admitting: Allergy

## 2022-10-31 DIAGNOSIS — M5481 Occipital neuralgia: Secondary | ICD-10-CM | POA: Diagnosis present

## 2022-10-31 DIAGNOSIS — M6281 Muscle weakness (generalized): Secondary | ICD-10-CM | POA: Insufficient documentation

## 2022-10-31 DIAGNOSIS — M5459 Other low back pain: Secondary | ICD-10-CM | POA: Insufficient documentation

## 2022-10-31 NOTE — Therapy (Signed)
OUTPATIENT PHYSICAL THERAPY TREATMENT NOTE/DC SUMMARY   Patient Name: Monica Hunter MRN: [id number] DOB:1981/01/12, 42 y.o., female Today's Date: 10/31/2022  PCP: Tollie Eth, NP  REFERRING PROVIDER: Tollie Eth, NP  PHYSICAL THERAPY DISCHARGE SUMMARY  Visits from Start of Care: 7  Current functional level related to goals / functional outcomes: Goals met   Remaining deficits: Mild pain   Education / Equipment: HEP   Patient agrees to discharge. Patient goals were met. Patient is being discharged due to being pleased with the current functional level.  END OF SESSION:   PT End of Session - 10/31/22 1505     Visit Number 7    Number of Visits 7    Date for PT Re-Evaluation 10/24/22    Authorization Type [employer]    PT Start Time 1450    PT Stop Time 1530    PT Time Calculation (min) 40 min    Activity Tolerance Patient tolerated treatment well    Behavior During Therapy WFL for tasks assessed/performed               Past Medical History:  Diagnosis Date   Asthma    Candidal dermatitis 11/04/2021   Cornea abrasion    Diabetes mellitus    Migraines    Morbid obesity (HCC)    Past Surgical History:  Procedure Laterality Date   CERVICAL POLYPECTOMY     DILATION AND CURETTAGE OF UTERUS     Patient Active Problem List   Diagnosis Date Noted   Bilateral occipital neuralgia 08/13/2022   Chronic bilateral low back pain without sciatica 03/17/2022   Overflow incontinence of urine 12/03/2021   Type 2 diabetes mellitus with diabetic dermatitis, with long-term current use of insulin (HCC) 11/04/2021   Oligomenorrhea 11/04/2021   Gastroesophageal reflux disease 11/04/2021   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   Asthma 02/14/2018   Class 3 severe obesity due to excess calories without serious comorbidity with body mass index (BMI) of 50.0 to 59.9 in adult (HCC) 02/14/2018   Hidradenitis suppurativa 02/14/2018    REFERRING DIAG: M54.81 (ICD-10-CM) - Bilateral  occipital neuralgia M54.50,G89.29 (ICD-10-CM) - Chronic bilateral low back pain without sciatica   THERAPY DIAG: M54.81 (ICD-10-CM) - Bilateral occipital neuralgia M54.50,G89.29 (ICD-10-CM) - Chronic bilateral low back pain without sciatica    Rationale for Evaluation and Treatment Rehabilitation  PERTINENT HISTORY: [name] also experiences neck pain and stiffness, which she suspects may be contributing to her migraines. She has been diagnosed with occipital neuralgia and possesses a chiropractic report indicating degenerative disc disease, mild scoliosis on the left side, and a discrepancy in hip height, with her left hip being 24 millimeters lower than the right. Although she has not consulted a back specialist, these findings may be relevant to her condition.   PRECAUTIONS: none  SUBJECTIVE:  SUBJECTIVE STATEMENT:  Able to return to ADLs but notes symptom onset with sudden movements.  Feels she is ready for independent management   PAIN:  Are you having pain? Yes: NPRS scale: 5/10 Pain location: neck and back Pain description: ache Aggravating factors: prolonged positioning Relieving factors: position changes   OBJECTIVE: (objective measures completed at initial evaluation unless otherwise dated)   DIAGNOSTIC FINDINGS:  None available   PATIENT SURVEYS:  FOTO 36(51 predicted); 10/31/22 40%   SCREENING FOR RED FLAGS: negative   MUSCLE LENGTH: Hamstrings: Right 60 deg; Left 60 deg   POSTURE: rounded shoulders and forward head   PALPATION: Deferred due to body habitus   CERVICAL ROM:    Active ROM A/PROM (deg) 08/29/2022 4//11/24  Flexion 100% 90%  Extension 75% 90%  Right lateral flexion 75% 90%  Left lateral flexion 90% 90%  Right rotation 90% 75%  Left rotation 100% 100%   (Blank rows  = not tested)   UE ROM: WNL   Active ROM Right 08/29/2022 Left 08/29/2022  Shoulder flexion      Shoulder extension      Shoulder abduction      Shoulder adduction      Shoulder extension      Shoulder internal rotation      Shoulder external rotation      Elbow flexion      Elbow extension      Wrist flexion      Wrist extension      Wrist ulnar deviation      Wrist radial deviation      Wrist pronation      Wrist supination       (Blank rows = not tested)   UE MMT: WNL   MMT Right 08/29/2022 Left 08/29/2022  Shoulder flexion      Shoulder extension      Shoulder abduction      Shoulder adduction      Shoulder extension      Shoulder internal rotation      Shoulder external rotation      Middle trapezius      Lower trapezius      Elbow flexion      Elbow extension      Wrist flexion      Wrist extension      Wrist ulnar deviation      Wrist radial deviation      Wrist pronation      Wrist supination      Grip strength       (Blank rows = not tested)   CERVICAL SPECIAL TESTS:  Neck flexor muscle endurance test: 24s     FUNCTIONAL TESTS:  5 times sit to stand: 16s with UE Support     LUMBAR ROM:    AROM eval  Flexion    Extension    Right lateral flexion    Left lateral flexion    Right rotation 90%  Left rotation 90%   (Blank rows = not tested)   LOWER EXTREMITY ROM:   WFL   Active  Right eval Left eval  Hip flexion      Hip extension      Hip abduction      Hip adduction      Hip internal rotation      Hip external rotation      Knee flexion      Knee extension      Ankle dorsiflexion      Ankle  plantarflexion      Ankle inversion      Ankle eversion       (Blank rows = not tested)   LOWER EXTREMITY MMT:     MMT Right eval Left eval B 10/31/22  Hip flexion 4- 4- 4  Hip extension 4- 4- 4  Hip abduction 4- 4- 4  Hip adduction       Hip internal rotation       Hip external rotation       Knee flexion       Knee extension 4-  4- 4  Ankle dorsiflexion       Ankle plantarflexion 4- 4- 4  Ankle inversion       Ankle eversion        (Blank rows = not tested)   LUMBAR SPECIAL TESTS:  Straight leg raise test: Negative, Slump test: Negative, and FABER test: deferred   GAIT: Distance walked: 19ftx2 Assistive device utilized: None Level of assistance: Complete Independence Comments: unremarkable   TODAY'S TREATMENT:    [facility] Adult PT Treatment:                                                DATE: 10/31/22 Therapeutic Exercise: Nustep L6 8 min Seated hamstring stretch 30s x2 Curl up with arms crossed 15x Supine march 15/15 with PPT  Supine SLR 15/15 Open book 10/10 90/90 30s x2    [facility] Adult PT Treatment:                                                DATE: 10/24/22 Therapeutic Exercise: Nustep L4 8 min Seated hamstring stretch 30s x2 Curl up with reach 15x Supine march 15/15 with PPT 2.5# Supine SLR 15/15 2.5# Open book 10/10 Seated latissimus press with LAQs alt. 10/10  [facility] Adult PT Treatment:                                                DATE: 10/17/22 Therapeutic Exercise: Nustep L4 8 min Seated hamstring stretch 30s x2 PPT 3s hold 10x Supine march 15/15 with PPT Supine bridge 15x Supine SLR 15/15  Open book 10/10 Seated latissimus press 3s hold 10x Seated latissimus press with LAQs alt. 10/10                                                                                                                         DATE: 08/29/22 Eval       PATIENT EDUCATION:  Education details: Discussed eval findings, rehab rationale and POC and patient is in agreement  Person educated: Patient Education method: Explanation Education comprehension: verbalized understanding and needs further education   HOME EXERCISE PROGRAM: Access Code: [email or web address] URL: [email or web address] Date: 10/31/2022 Prepared by: Gustavus Bryant  Exercises - Sit to Stand with Arms Crossed  - 2 x daily - 5 x  weekly - 1 sets - 5 reps - Supine March  - 2 x daily - 5 x weekly - 2 sets - 10 reps - Supine 90/90 Abdominal Bracing  - 2 x daily - 5 x weekly - 1 sets - 2 reps - 30s hold - Curl Up with Arms Crossed  - 2 x daily - 5 x weekly - 1 sets - 15 reps - Supine Quadratus Lumborum Stretch  - 2 x daily - 5 x weekly - 1 sets - 2 reps - 30s hold   ASSESSMENT:   CLINICAL IMPRESSION: Symptoms stabilized.  Patient feels ready for independent management.  Goals partially met. HEP updated to address remaining deficits.  EVAL: Patient is a 42 y.o. female who was seen today for physical therapy evaluation and treatment for suboccipital neuralgia and low back pain w/o sciatica.  Patient presents with weakness in LE's and unable to stand w/o UE support.  Cervical mobility slightly limited in upper cervical spine.  BUE ROM and strength WNL, deep neck flexor strength functional.  Patient demonstrates a rounded shoulder and forward head posture impinging suboccipital nerves.   OBJECTIVE IMPAIRMENTS: decreased activity tolerance, decreased knowledge of condition, decreased mobility, decreased ROM, decreased strength, impaired perceived functional ability, impaired flexibility, postural dysfunction, obesity, and pain.    ACTIVITY LIMITATIONS: carrying, lifting, bending, sitting, standing, squatting, and sleeping   PERSONAL FACTORS: Fitness and 1 comorbidity: diabetes  are also affecting patient's functional outcome.    REHAB POTENTIAL: Good   CLINICAL DECISION MAKING: Evolving/moderate complexity   EVALUATION COMPLEXITY: Moderate     GOALS: Goals reviewed with patient? Yes   SHORT TERM GOALS: Target date: 09/19/2022   Patient to demonstrate independence in HEP  Baseline: [id number] Goal status: Met   2.  Patient to perform single sit to stand w/o UE support Baseline: Requires UE support; 10/03/22 STS arms crossed 1x Goal status: Met   3.  Decrease HA intensity to 6/10 Baseline: 8/10; 10/03/22 4/10 Goal  status: Met       LONG TERM GOALS: Target date: 10/10/2022     Increase B LE strength to 4/5 Baseline:  MMT Right eval Left eval B 10/31/22  Hip flexion 4- 4- 4  Hip extension 4- 4- 4  Hip abduction 4- 4- 4  Hip adduction       Hip internal rotation       Hip external rotation       Knee flexion       Knee extension 4- 4- 4  Ankle dorsiflexion       Ankle plantarflexion 4- 4- 4  Ankle inversion       Ankle eversion         Goal status: Met   2.  Increase FOTO score to 51 Baseline: 36; 10/31/22 Goal status: Partially met   3.  Increase cervical AROM to 90% in deficit areas Baseline:  Active ROM A/PROM (deg) 08/29/2022 4//11/24  Flexion 100% 90%  Extension 75% 90%  Right lateral flexion 75% 90%  Left lateral flexion 90% 90%  Right rotation 90% 75%  Left rotation 100% 100%    Goal status: Met   4.  Decrease HA intensity to 4/10 Baseline: 8/10 intensity; 10/24/22  1/10 Goal status: Met   5.  70d SLR Bil Baseline: 60d SLR Bil; 10/31/22 70d B Goal status: Met       PLAN:   PT FREQUENCY: 1x/week   PT DURATION: 6 weeks   PLANNED INTERVENTIONS: Therapeutic exercises, Therapeutic activity, Neuromuscular re-education, Balance training, Gait training, Patient/Family education, Self Care, Joint mobilization, Dry Needling, Electrical stimulation, Spinal mobilization, Manual therapy, and Re-evaluation.   PLAN FOR NEXT SESSION: HEP review, LE flexibility tasks, core strength, postural exercises, aerobic work, manual as needed   Hildred Laser, PT 10/31/2022, 3:57 PM Check all possible CPT codes: 82956 - PT Re-evaluation, 97110- Therapeutic Exercise, [phone]- Neuro Re-education, [phone] - Gait Training, [phone] - Manual Therapy, [phone] - Therapeutic Activities, and [phone] - Self Care    Check all conditions that are expected to impact treatment: Morbid obesity, Respiratory disorders, and Diabetes mellitus   If treatment provided at initial evaluation, no treatment charged due to  lack of authorization.

## 2022-11-06 ENCOUNTER — Ambulatory Visit: Payer: Medicare Other

## 2022-11-07 ENCOUNTER — Telehealth: Payer: Self-pay | Admitting: Genetic Counselor

## 2022-11-07 NOTE — Telephone Encounter (Signed)
Interested in genetic counsleing appt.  Strong family history of cancer.  Grandmother w/ metastatic cancer in 36s.  Limited info about cancer family history.  Scheduled genetics appt 6/27 at 10am.

## 2022-11-08 ENCOUNTER — Other Ambulatory Visit: Payer: Self-pay

## 2022-11-08 DIAGNOSIS — M545 Low back pain, unspecified: Secondary | ICD-10-CM

## 2022-11-15 ENCOUNTER — Other Ambulatory Visit: Payer: Self-pay | Admitting: Allergy

## 2022-11-15 DIAGNOSIS — L2481 Irritant contact dermatitis due to metals: Secondary | ICD-10-CM

## 2022-11-21 ENCOUNTER — Other Ambulatory Visit: Payer: Self-pay | Admitting: Neurosurgery

## 2022-11-21 DIAGNOSIS — M5136 Other intervertebral disc degeneration, lumbar region: Secondary | ICD-10-CM

## 2022-12-12 ENCOUNTER — Other Ambulatory Visit: Payer: Medicare Other

## 2022-12-18 ENCOUNTER — Ambulatory Visit
Admission: RE | Admit: 2022-12-18 | Discharge: 2022-12-18 | Disposition: A | Discharge status: Home / Self Care | Payer: Medicare Other | Source: Ambulatory Visit | Attending: Neurosurgery | Admitting: Neurosurgery

## 2022-12-18 DIAGNOSIS — M5136 Other intervertebral disc degeneration, lumbar region: Secondary | ICD-10-CM

## 2022-12-26 ENCOUNTER — Other Ambulatory Visit (HOSPITAL_BASED_OUTPATIENT_CLINIC_OR_DEPARTMENT_OTHER): Payer: Self-pay | Admitting: Nurse Practitioner

## 2022-12-26 ENCOUNTER — Encounter: Payer: Self-pay | Admitting: Nurse Practitioner

## 2022-12-26 ENCOUNTER — Inpatient Hospital Stay: Payer: Medicare Other

## 2022-12-26 ENCOUNTER — Inpatient Hospital Stay: Payer: Medicare Other | Attending: Genetic Counselor | Admitting: Genetic Counselor

## 2022-12-26 ENCOUNTER — Other Ambulatory Visit: Payer: Self-pay

## 2022-12-26 ENCOUNTER — Other Ambulatory Visit: Payer: Self-pay | Admitting: Genetic Counselor

## 2022-12-26 ENCOUNTER — Other Ambulatory Visit: Payer: Self-pay | Admitting: Nurse Practitioner

## 2022-12-26 DIAGNOSIS — Z803 Family history of malignant neoplasm of breast: Secondary | ICD-10-CM

## 2022-12-26 DIAGNOSIS — Z794 Long term (current) use of insulin: Secondary | ICD-10-CM

## 2022-12-26 LAB — GENETIC SCREENING ORDER

## 2022-12-26 MED ORDER — OZEMPIC (0.25 OR 0.5 MG/DOSE) 2 MG/3ML ~~LOC~~ SOPN
0.5000 mg | PEN_INJECTOR | SUBCUTANEOUS | 0 refills | Status: DC
Start: 2022-12-26 — End: 2023-02-04

## 2022-12-26 MED ORDER — SUMATRIPTAN SUCCINATE 100 MG PO TABS: 100.0000 mg | ORAL_TABLET | ORAL | 0 refills | Status: DC | PRN

## 2022-12-26 MED ORDER — DEXCOM G7 RECEIVER DEVI
0 refills | Status: DC
Start: 2022-12-26 — End: 2023-07-17

## 2022-12-26 NOTE — Addendum Note (Signed)
Addended by: Christiane Ha on: 12/26/2022 10:36 AM   Modules accepted: Orders

## 2022-12-30 ENCOUNTER — Encounter (HOSPITAL_BASED_OUTPATIENT_CLINIC_OR_DEPARTMENT_OTHER): Payer: Self-pay

## 2022-12-30 ENCOUNTER — Ambulatory Visit (INDEPENDENT_AMBULATORY_CARE_PROVIDER_SITE_OTHER): Payer: Medicare HMO | Admitting: Obstetrics & Gynecology

## 2022-12-30 ENCOUNTER — Encounter (HOSPITAL_BASED_OUTPATIENT_CLINIC_OR_DEPARTMENT_OTHER): Payer: Self-pay | Admitting: Obstetrics & Gynecology

## 2022-12-30 ENCOUNTER — Encounter: Payer: Self-pay | Admitting: Nurse Practitioner

## 2022-12-30 DIAGNOSIS — B372 Candidiasis of skin and nail: Secondary | ICD-10-CM | POA: Diagnosis not present

## 2022-12-30 MED ORDER — FLUCONAZOLE 150 MG PO TABS
ORAL_TABLET | ORAL | 2 refills | Status: DC
Start: 2022-12-30 — End: 2023-02-04

## 2022-12-30 NOTE — Progress Notes (Signed)
REFERRING PROVIDER: No referring provider defined for this encounter.  PRIMARY PROVIDER:  Tollie Eth, NP  PRIMARY REASON FOR VISIT:  1. Family history of breast cancer     HISTORY OF PRESENT ILLNESS:   Ms. Monica Hunter, a 42 y.o. female, was seen for a [facility] cancer genetics consultation due to a family history of cancer.  Ms. Monica Hunter presents to clinic today to discuss the possibility of a hereditary predisposition to cancer, to discuss genetic testing, and to further clarify her future cancer risks, as well as potential cancer risks for family members.   Ms. Monica Hunter is a 42 y.o. female with no personal history of cancer.    CANCER HISTORY:  Oncology History   No history exists.     RISK FACTORS:  Mammogram within the last year: no Number of breast biopsies: 0. Colonoscopy: no; not examined. Hysterectomy: no.  Ovaries intact: yes.  Menarche was at age 79.  Nulliparous. OCP use for approximately  10 or 15  years.    Past Medical History:  Diagnosis Date   Asthma    Candidal dermatitis 11/04/2021   Cornea abrasion    Diabetes mellitus    Migraines    Morbid obesity (HCC)     Past Surgical History:  Procedure Laterality Date   CERVICAL POLYPECTOMY     DILATION AND CURETTAGE OF UTERUS        FAMILY HISTORY:  We obtained a detailed, 4-generation family history.  Significant diagnoses are listed below: Family History  Problem Relation Age of Onset   Cancer Maternal Grandmother 44       unknown type, metastatic   Breast cancer Maternal Aunt 30 - 35       unilat. mast.   Cancer Other        unknown type; one dx in 44s     Ms. Monica Hunter is unaware of previous family history of genetic testing for hereditary cancer risks. Other relatives/affected relatives are unavailable for genetic testing.   There is no reported Ashkenazi Jewish ancestry. Her parents are second cousins.   GENETIC COUNSELING ASSESSMENT: Ms. Monica Hunter is a 42 y.o. female with a family history  of breast cancer which is somewhat suggestive of a hereditary cancer syndrome given her aunt's age of breast cancer diagnosis. We, therefore, discussed and recommended the following at today's visit.   DISCUSSION: We discussed that 5 - 10% of cancer is hereditary, with most cases of hereditary breast cancer associated with mutations in BRCA1/2.  There are other genes that can be associated with hereditary breast cancer syndromes.  We discussed that testing is beneficial for several reasons, including knowing about other cancer risks, identifying potential screening and risk-reduction options that may be appropriate, and to understanding if other family members could be at risk for cancer and allowing them to undergo genetic testing.  We reviewed the characteristics, features and inheritance patterns of hereditary cancer syndromes. We also discussed genetic testing, including the appropriate family members to test, the process of testing, insurance coverage and turn-around-time for results. We discussed the implications of a negative, positive, and variant of uncertain significant result. We discussed that negative results would be uninformative given that Ms. Monica Hunter does not have a personal history of cancer. We recommended Ms. Monica Hunter pursue genetic testing for a panel that contains genes associated with breast cancer and other cancers.  The Multi-Cancer + RNA Panel offered by [facility] includes sequencing and/or deletion/duplication analysis of the following 70 genes:  AIP*, ALK, APC*, ATM*,  AXIN2*, BAP1*, BARD1*, BLM*, BMPR1A*, BRCA1*, BRCA2*, BRIP1*, CDC73*, CDH1*, CDK4, CDKN1B*, CDKN2A, CHEK2*, CTNNA1*, DICER1*, EPCAM (del/dup only), EGFR, FH*, FLCN*, GREM1 (promoter dup only), HOXB13, KIT, LZTR1, MAX*, MBD4, MEN1*, MET, MITF, MLH1*, MSH2*, MSH3*, MSH6*, MUTYH*, NF1*, NF2*, NTHL1*, PALB2*, PDGFRA, PMS2*, POLD1*, POLE*, POT1*, PRKAR1A*, PTCH1*, PTEN*, RAD51C*, RAD51D*, RB1*, RET, SDHA* (sequencing only),  SDHAF2*, SDHB*, SDHC*, SDHD*, SMAD4*, SMARCA4*, SMARCB1*, SMARCE1*, STK11*, SUFU*, TMEM127*, TP53*, TSC1*, TSC2*, VHL*. RNA analysis is performed for * genes.  Based on Ms. Monica Hunter's family history of of her maternal aunt with breast cancer in her 30s (unavailable for genetic testing), she meets medical criteria for genetic testing.    We discussed the Genetic Information Non-Discrimination Act (GINA) of 2008, which helps protect individuals against genetic discrimination based on their genetic test results.  It impacts both health insurance and employment.  With health insurance, it protects against genetic test results being used for increased premiums or policy termination. For employment, it protects against hiring, firing and promoting decisions based on genetic test results.  GINA does not apply to those in the [employer], those who work for companies with less than 15 employees, and new life insurance or long-term disability insurance policies.  Health status due to a cancer diagnosis is not protected under GINA.  PLAN: After considering the risks, benefits, and limitations, Ms. Monica Hunter provided informed consent to pursue genetic testing and the blood sample was sent to [facility] for analysis of the Multi-Cancer +RNA Panel. Results should be available within approximately 3 weeks' time, at which point they will be disclosed by telephone to Ms. Monica Hunter, as will any additional recommendations warranted by these results. Ms. Monica Hunter will receive a summary of her genetic counseling visit and a copy of her results once available. This information will also be available in Epic.   Ms. Monica Hunter questions were answered to her satisfaction today. Our contact information was provided should additional questions or concerns arise.  Azion Centrella M. Rennie Plowman, MS, [facility] Genetic Counselor [email or web address] (P) [phone]   The patient was seen for a total of 40 minutes in face-to-face genetic  counseling.  The patient was seen alone.  Drs. Pamelia Hoit and/or Mosetta Putt were available to discuss this case as needed.  _______________________________________________________________________ For Office Staff:  Number of people involved in session: 1 Was an Intern/ student involved with case: no

## 2022-12-31 ENCOUNTER — Other Ambulatory Visit (HOSPITAL_COMMUNITY)
Admission: RE | Admit: 2022-12-31 | Discharge: 2022-12-31 | Disposition: A | Discharge status: Home / Self Care | Payer: Medicare HMO | Source: Ambulatory Visit | Attending: Obstetrics & Gynecology | Admitting: Obstetrics & Gynecology

## 2022-12-31 DIAGNOSIS — B372 Candidiasis of skin and nail: Secondary | ICD-10-CM | POA: Insufficient documentation

## 2022-12-31 NOTE — Telephone Encounter (Signed)
Patient saw obgyn yesterday for this

## 2023-01-01 ENCOUNTER — Encounter (HOSPITAL_BASED_OUTPATIENT_CLINIC_OR_DEPARTMENT_OTHER): Payer: Self-pay | Admitting: Obstetrics & Gynecology

## 2023-01-01 LAB — CERVICOVAGINAL ANCILLARY ONLY
Bacterial Vaginitis (gardnerella): NEGATIVE
Candida Glabrata: NEGATIVE
Candida Vaginitis: POSITIVE — AB
Comment: NEGATIVE
Comment: NEGATIVE
Comment: NEGATIVE

## 2023-01-01 NOTE — Progress Notes (Signed)
GYNECOLOGY  VISIT  CC:   vaginal discharge  HPI: 42 y.o. G0P0000 Single Black or African American female here for complaints of vaginal discharge with itching.  Self swab completed.  Pt has gained weight since I saw her last.  Has been prescribed ozempic and finally able to get it filled.  Seeing Enid Skeens.  Reports blood sugars are not as good right now and most recent hba1c 08/05/2022 was 7.5.  She is still smoking.  Recent chest cold with loss of voice so straining today to talk.  Pt having trouble with a phone account and is on the phone the entire time of visit so does self swab and ultimately needs to leave to handle this issue.   Past Medical History:  Diagnosis Date   Asthma    Candidal dermatitis 11/04/2021   Cornea abrasion    Diabetes mellitus    Migraines    Morbid obesity (HCC)     MEDS:   Current Outpatient Medications on File Prior to Visit  Medication Sig Dispense Refill   albuterol (PROVENTIL) (2.5 MG/3ML) 0.083% nebulizer solution Take 3 mLs (2.5 mg total) by nebulization every 4 (four) hours as needed for wheezing or shortness of breath. 75 mL 1   albuterol (VENTOLIN HFA) 108 (90 Base) MCG/ACT inhaler Inhale 2 puffs into the lungs every 6 (six) hours as needed for wheezing or shortness of breath. 18 g 1   Azelastine-Fluticasone (DYMISTA) 137-50 MCG/ACT SUSP Place 1 spray into the nose 2 (two) times daily as needed (runny/stuffy nose). 23 g 5   budesonide-formoterol (SYMBICORT) 160-4.5 MCG/ACT inhaler Inhale 2 puffs into the lungs 2 (two) times daily. 1 each 5   Continuous Glucose Receiver (DEXCOM G7 RECEIVER) DEVI Use to check BG 1 each 0   cromolyn (OPTICROM) 4 % ophthalmic solution Place 2 drops into both eyes 4 (four) times daily as needed (itchy/watery eyes). 10 mL 5   diclofenac (VOLTAREN) 50 MG EC tablet Take 50 mg by mouth as needed.     famotidine (PEPCID) 20 MG tablet TAKE 1 TABLET BY MOUTH TWICE A DAY 180 tablet 1   fluoruracil (CARAC) 0.5 % cream Apply  topically daily. Apply 1 drop of salicylic acid with pea sized amount of fluorouracil to the top of the wart only daily using q-tip. Allow to dry and cover with bandage. 30 g 0   gabapentin (NEURONTIN) 300 MG capsule Take 1 capsule (300 mg total) by mouth at bedtime. 30 capsule 3   hydrOXYzine (ATARAX) 25 MG tablet TAKE 1 TABLET BY MOUTH EVERY 8 HOURS AS NEEDED. 270 tablet 2   levocetirizine (XYZAL) 5 MG tablet Take 1 tablet (5 mg total) by mouth every evening. 30 tablet 5   montelukast (SINGULAIR) 10 MG tablet TAKE 1 TABLET BY MOUTH EVERYDAY AT BEDTIME 90 tablet 1   norethindrone (MICRONOR) 0.35 MG tablet Take 1 tablet (0.35 mg total) by mouth daily. 84 tablet 1   nystatin ointment (MYCOSTATIN) Apply 1 application. topically 2 (two) times daily. 30 g 6   ondansetron (ZOFRAN-ODT) 4 MG disintegrating tablet Take 1 tablet (4 mg total) by mouth every 8 (eight) hours as needed for nausea or vomiting. 20 tablet 0   Salicylic Acid (SALICYLIC ACID WART REMOVER) 27.5 % LIQD Apply 1 drop of salicylic acid with pea sized amount of fluorouracil to the top of the wart only daily using q-tip. Allow to dry and cover with bandage. 10 mL 1   Semaglutide,0.25 or 0.5MG /DOS, (OZEMPIC, 0.25 OR 0.5  MG/DOSE,) 2 MG/3ML SOPN Inject 0.5 mg into the skin once a week. 3 mL 0   Spacer/Aero-Holding Chambers (AEROCHAMBER PLUS WITH MASK) inhaler Use as directed with Metered Dose Inhaler 1 each 1   SUMAtriptan (IMITREX) 100 MG tablet Take 1 tablet (100 mg total) by mouth as needed for migraine. May repeat in 2 hours if headache persists or recurs. 10 tablet 0   triamcinolone cream (KENALOG) 0.1 % Apply 1 application. topically 2 (two) times daily. To affected area(s) as needed 30 g 3   valACYclovir (VALTREX) 1000 MG tablet Take 1 tablet (1,000 mg total) by mouth daily. 30 tablet 6   Vitamins A & D (VITAMIN A & D) ointment Apply 1 application. topically as needed for dry skin. Apply twice a day to rectum and vaginal area with nystatin  cream. 45 g 3   Continuous Blood Gluc Sensor (DEXCOM G7 SENSOR) MISC Apply one sensor to the abdomen every 10 days. (Patient not taking: Reported on 10/23/2022) 3 each 11   No current facility-administered medications on file prior to visit.    ALLERGIES: Ceftriaxone, Methocarbamol, and Sulfa antibiotics  SH:  single, non smoker  Review of Systems  Constitutional: Negative.   Genitourinary: Negative.     PHYSICAL EXAMINATION:    BP (!) 158/98 (BP Location: Left Arm, Patient Position: Sitting, Cuff Size: Large)   Pulse 92   Ht 5\' 8"  (1.727 m) Comment: Reported  Wt (!) 388 lb 9.6 oz (176.3 kg)   BMI 59.09 kg/m     Physical Exam Constitutional:      Appearance: Normal appearance.  Neurological:     General: No focal deficit present.     Mental Status: She is alert.  Psychiatric:        Mood and Affect: Mood normal.        Behavior: Behavior normal.    Assessment/Plan: 1. Candidal dermatitis - will treat discharge and itching for now as fungal infection.   - fluconazole (DIFLUCAN) 150 MG tablet; Take 1 tablet and repeat in 72 hours  Dispense: 2 tablet; Refill: 2 - Cervicovaginal ancillary only( [place])

## 2023-01-06 ENCOUNTER — Telehealth: Payer: Self-pay | Admitting: Genetic Counselor

## 2023-01-06 ENCOUNTER — Encounter: Payer: Self-pay | Admitting: Genetic Counselor

## 2023-01-06 ENCOUNTER — Ambulatory Visit: Payer: Self-pay | Admitting: Genetic Counselor

## 2023-01-06 DIAGNOSIS — Z6841 Body Mass Index (BMI) 40.0 and over, adult: Secondary | ICD-10-CM | POA: Diagnosis not present

## 2023-01-06 DIAGNOSIS — M5136 Other intervertebral disc degeneration, lumbar region: Secondary | ICD-10-CM | POA: Diagnosis not present

## 2023-01-06 DIAGNOSIS — Z1379 Encounter for other screening for genetic and chromosomal anomalies: Secondary | ICD-10-CM

## 2023-01-06 DIAGNOSIS — Z803 Family history of malignant neoplasm of breast: Secondary | ICD-10-CM

## 2023-01-06 DIAGNOSIS — Z0000 Encounter for general adult medical examination without abnormal findings: Secondary | ICD-10-CM | POA: Insufficient documentation

## 2023-01-06 NOTE — Progress Notes (Signed)
HPI:   Monica Hunter was previously seen in the [facility] due to a family history of breast cancer and concerns regarding a hereditary predisposition to cancer.    Monica Hunter recent genetic test results were disclosed to her by telephone. These results and recommendations are discussed in more detail below.  CANCER HISTORY:  Oncology History   No history exists.    FAMILY HISTORY:  We obtained a detailed, 4-generation family history.  Significant diagnoses are listed below:      Family History  Problem Relation Age of Onset   Cancer Maternal Grandmother 103        unknown type, metastatic   Breast cancer Maternal Aunt 30 - 15        unilat. mast.   Cancer Other          unknown type; one dx in 59s       Monica Hunter is unaware of previous family history of genetic testing for hereditary cancer risks. Other relatives/affected relatives are unavailable for genetic testing.    There is no reported Ashkenazi Jewish ancestry. Her parents are second cousins.   GENETIC TEST RESULTS:  The [facility] Multi-Cancer +RNA Panel found no pathogenic mutations.   The Multi-Cancer + RNA Panel offered by [facility] includes sequencing and/or deletion/duplication analysis of the following 70 genes:  AIP*, ALK, APC*, ATM*, AXIN2*, BAP1*, BARD1*, BLM*, BMPR1A*, BRCA1*, BRCA2*, BRIP1*, CDC73*, CDH1*, CDK4, CDKN1B*, CDKN2A, CHEK2*, CTNNA1*, DICER1*, EPCAM (del/dup only), EGFR, FH*, FLCN*, GREM1 (promoter dup only), HOXB13, KIT, LZTR1, MAX*, MBD4, MEN1*, MET, MITF, MLH1*, MSH2*, MSH3*, MSH6*, MUTYH*, NF1*, NF2*, NTHL1*, PALB2*, PDGFRA, PMS2*, POLD1*, POLE*, POT1*, PRKAR1A*, PTCH1*, PTEN*, RAD51C*, RAD51D*, RB1*, RET, SDHA* (sequencing only), SDHAF2*, SDHB*, SDHC*, SDHD*, SMAD4*, SMARCA4*, SMARCB1*, SMARCE1*, STK11*, SUFU*, TMEM127*, TP53*, TSC1*, TSC2*, VHL*. RNA analysis is performed for * genes.  The test report has been scanned into EPIC and is located under the Molecular Pathology  section of the Results Review tab.  A portion of the result report is included below for reference. Genetic testing reported out on January 05, 2023.      Even though a pathogenic variant was not identified, possible explanations for the cancer in the family may include: There may be no hereditary risk for cancer in the family. The cancers in Monica Hunter family may be sporadic/familial or due to other genetic and environmental factors. There may be a gene mutation in one of these genes that current testing methods cannot detect but that chance is small. There could be another gene that has not yet been discovered, or that we have not yet tested, that is responsible for the cancer diagnoses in the family.  It is also possible there is a hereditary cause for the cancer in the family that Monica Hunter did not inherit.   Therefore, it is important to remain in touch with cancer genetics in the future so that we can continue to offer Monica Hunter the most up to date genetic testing.     ADDITIONAL GENETIC TESTING:   Monica Hunter genetic testing was fairly extensive.  If there are additional relevant genes identified to increase cancer risk that can be analyzed in the future, we would be happy to discuss and coordinate this testing at that time.     CANCER SCREENING RECOMMENDATIONS:  Monica Hunter test result is considered negative (normal).  This means that we have not identified a hereditary cause for her family history of breast cancer at this time.  An individual's cancer risk and medical management are not determined by genetic test results alone. Overall cancer risk assessment incorporates additional factors, including personal medical history, family history, and any available genetic information that may result in a personalized plan for cancer prevention and surveillance. Therefore, it is recommended she continue to follow the cancer management and screening guidelines provided by her   primary healthcare provider.  Her lifetime risk for breast cancer based on Tyrer-Cuzick risk model and reported personal/family history is 17.5%.  This is elevated compared to the general population (10.8%) but not in the 'high risk for breast cancer' category per NCCN guidelines (>20%).  This risk estimate can change over time and may be repeated to reflect new information in her personal or family history in the future.        RECOMMENDATIONS FOR FAMILY MEMBERS:   Individuals in this family might be at some increased risk of developing cancer, over the general population risk, due to the family history of cancer.  Individuals in the family should notify their providers of the family history of cancer. We recommend women in this family have a yearly mammogram beginning at age 59, or 22 years younger than the earliest onset of cancer, an annual clinical breast exam, and perform monthly breast self-exams.  Risk models that take into account family history and hormonal history may be helpful in determining appropriate breast cancer screening options for family members. Other members of the family may still carry a pathogenic variant in one of these genes that Monica Hunter did not inherit. Based on the family history, we recommend her maternal aunt, who was diagnosed with breast cancer in her 30s, have genetic counseling and testing. Monica Hunter can let us know if we can be of any assistance in coordinating genetic counseling and/or testing for these family member.     FOLLOW-UP:  Cancer genetics is a rapidly advancing field and it is possible that new genetic tests will be appropriate for her and/or her family members in the future. We encourage Monica Hunter to remain in contact with cancer genetics, so we can update her personal and family histories and let her know of advances in cancer genetics that may benefit this family.   Our contact number was provided.  She knows she is welcome to call us at  anytime with additional questions or concerns.   Yandel Zeiner M. Rennie Plowman, MS, [facility] Genetic Counselor [email or web address] (P) [phone]

## 2023-01-06 NOTE — Telephone Encounter (Signed)
Disclosed negative genetics.    

## 2023-01-23 ENCOUNTER — Ambulatory Visit: Payer: Medicare HMO | Admitting: Allergy

## 2023-01-23 ENCOUNTER — Encounter: Payer: Self-pay | Admitting: Allergy

## 2023-01-23 VITALS — BP 115/60 | HR 96 | Ht 68.0 in | Wt 370.2 lb

## 2023-01-23 DIAGNOSIS — L2481 Irritant contact dermatitis due to metals: Secondary | ICD-10-CM | POA: Diagnosis not present

## 2023-01-23 DIAGNOSIS — L508 Other urticaria: Secondary | ICD-10-CM

## 2023-01-23 DIAGNOSIS — J4540 Moderate persistent asthma, uncomplicated: Secondary | ICD-10-CM

## 2023-01-23 DIAGNOSIS — H109 Unspecified conjunctivitis: Secondary | ICD-10-CM

## 2023-01-23 DIAGNOSIS — H1013 Acute atopic conjunctivitis, bilateral: Secondary | ICD-10-CM | POA: Diagnosis not present

## 2023-01-23 DIAGNOSIS — J310 Chronic rhinitis: Secondary | ICD-10-CM

## 2023-01-23 MED ORDER — BREZTRI AEROSPHERE 160-9-4.8 MCG/ACT IN AERO: 2.0000 | INHALATION_SPRAY | Freq: Two times a day (BID) | RESPIRATORY_TRACT | 5 refills | Status: DC

## 2023-01-23 MED ORDER — AZELASTINE-FLUTICASONE 137-50 MCG/ACT NA SUSP: 1.0000 | Freq: Two times a day (BID) | NASAL | 5 refills | Status: DC | PRN

## 2023-01-23 NOTE — Progress Notes (Signed)
Follow-up Note  RE: Monica Hunter MRN: [id number] DOB: [birth year] Date of Office Visit: 01/23/2023   History of present illness: Monica Hunter is a 42 y.o. female presenting today for follow-up of asthma, urticaria, rhinoconjunctivitis and contact dermatitis.  She was last in the office on 10/23/2022 by myself. She states her asthma is about the same.  She states she is still having symptoms where she does use her albuterol at least once a day.  She states she is using Symbicort 2 puffs twice a day and taking Singulair daily.  She has not however had any systemic steroids since the last visit or ED/urgent care visits. She states she is still having hives mostly after she takes a shower.  But she is having hives still regularly.  She is taking the xyzal twice a day with Pepcid twice a day and the Singulair. She states nasal spray Ryaltris does work well for her but it was not covered.  She states she did not get the Dymista spray from her pharmacy.  She did not get the cromolyn eyedrop from the pharmacy and it does help when she has itchy or watery eye symptoms. She continues to avoid jewelry that has caused rash with skin contact.   Review of systems: 10pt ROS negative unless noted above in HPI  Past medical/social/surgical/family history have been reviewed and are unchanged unless specifically indicated below.  No changes  Medication List: Current Outpatient Medications  Medication Sig Dispense Refill   albuterol (PROVENTIL) (2.5 MG/3ML) 0.083% nebulizer solution Take 3 mLs (2.5 mg total) by nebulization every 4 (four) hours as needed for wheezing or shortness of breath. 75 mL 1   albuterol (VENTOLIN HFA) 108 (90 Base) MCG/ACT inhaler Inhale 2 puffs into the lungs every 6 (six) hours as needed for wheezing or shortness of breath. 18 g 1   Azelastine-Fluticasone (DYMISTA) 137-50 MCG/ACT SUSP Place 1 spray into the nose 2 (two) times daily as needed (runny/stuffy nose). 23 g 5    budesonide-formoterol (SYMBICORT) 160-4.5 MCG/ACT inhaler Inhale 2 puffs into the lungs 2 (two) times daily. 1 each 5   Continuous Glucose Receiver (DEXCOM G7 RECEIVER) DEVI Use to check BG 1 each 0   cromolyn (OPTICROM) 4 % ophthalmic solution Place 2 drops into both eyes 4 (four) times daily as needed (itchy/watery eyes). 10 mL 5   diclofenac (VOLTAREN) 50 MG EC tablet Take 50 mg by mouth as needed.     famotidine (PEPCID) 20 MG tablet TAKE 1 TABLET BY MOUTH TWICE A DAY 180 tablet 1   fluconazole (DIFLUCAN) 150 MG tablet Take 1 tablet and repeat in 72 hours 2 tablet 2   fluoruracil (CARAC) 0.5 % cream Apply topically daily. Apply 1 drop of salicylic acid with pea sized amount of fluorouracil to the top of the wart only daily using q-tip. Allow to dry and cover with bandage. 30 g 0   gabapentin (NEURONTIN) 300 MG capsule Take 1 capsule (300 mg total) by mouth at bedtime. 30 capsule 3   hydrOXYzine (ATARAX) 25 MG tablet TAKE 1 TABLET BY MOUTH EVERY 8 HOURS AS NEEDED. 270 tablet 2   levocetirizine (XYZAL) 5 MG tablet Take 1 tablet (5 mg total) by mouth every evening. 30 tablet 5   montelukast (SINGULAIR) 10 MG tablet TAKE 1 TABLET BY MOUTH EVERYDAY AT BEDTIME 90 tablet 1   norethindrone (MICRONOR) 0.35 MG tablet Take 1 tablet (0.35 mg total) by mouth daily. 84 tablet 1   nystatin ointment (MYCOSTATIN)  Apply 1 application. topically 2 (two) times daily. 30 g 6   ondansetron (ZOFRAN-ODT) 4 MG disintegrating tablet Take 1 tablet (4 mg total) by mouth every 8 (eight) hours as needed for nausea or vomiting. 20 tablet 0   Salicylic Acid (SALICYLIC ACID WART REMOVER) 27.5 % LIQD Apply 1 drop of salicylic acid with pea sized amount of fluorouracil to the top of the wart only daily using q-tip. Allow to dry and cover with bandage. 10 mL 1   Semaglutide,0.25 or 0.5MG /DOS, (OZEMPIC, 0.25 OR 0.5 MG/DOSE,) 2 MG/3ML SOPN Inject 0.5 mg into the skin once a week. 3 mL 0   Spacer/Aero-Holding Chambers (AEROCHAMBER  PLUS WITH MASK) inhaler Use as directed with Metered Dose Inhaler 1 each 1   SUMAtriptan (IMITREX) 100 MG tablet Take 1 tablet (100 mg total) by mouth as needed for migraine. May repeat in 2 hours if headache persists or recurs. 10 tablet 0   triamcinolone cream (KENALOG) 0.1 % Apply 1 application. topically 2 (two) times daily. To affected area(s) as needed 30 g 3   valACYclovir (VALTREX) 1000 MG tablet Take 1 tablet (1,000 mg total) by mouth daily. 30 tablet 6   Vitamins A & D (VITAMIN A & D) ointment Apply 1 application. topically as needed for dry skin. Apply twice a day to rectum and vaginal area with nystatin cream. 45 g 3   Continuous Blood Gluc Sensor (DEXCOM G7 SENSOR) MISC Apply one sensor to the abdomen every 10 days. (Patient not taking: Reported on 10/23/2022) 3 each 11   No current facility-administered medications for this visit.     Known medication allergies: Allergies  Allergen Reactions   Ceftriaxone Hives and Itching   Methocarbamol Itching and Rash   Sulfa Antibiotics Other (See Comments), Hives, Itching and Rash    Reaction unknown     Physical examination: Blood pressure 115/60, pulse 96, height 5\' 8"  (1.727 m), weight (!) 370 lb 3.2 oz (167.9 kg), SpO2 97%.  General: Alert, interactive, in no acute distress, obese. HEENT: PERRLA, TMs pearly gray, turbinates minimally edematous without discharge, post-pharynx non erythematous. Neck: Supple without lymphadenopathy. Lungs: Clear to auscultation without wheezing, rhonchi or rales. {no increased work of breathing. CV: Normal S1, S2 without murmurs. Abdomen: Nondistended, nontender. Skin: Warm and dry, without lesions or rashes. Extremities:  No clubbing, cyanosis or edema. Neuro:   Grossly intact.  Diagnositics/Labs: None today   Assessment and plan: Chronic urticaria   - at this time etiology of hives and itching is spontaneous.  Hives can be caused by a variety of different triggers including  illness/infection, foods, medications, stings, exercise, pressure, vibrations, extremes of temperature to name a few however majority of the time there is no identifiable trigger.    - when able recommend getting labs done for hives: tryptase, hive panel, environmental panel, alpha-gal panel, inflammatory markers - for hive control recommend continuing Xyzal 5mg  1 tab twice a day with Pepcid 20mg  1 tab twice a day.   Keep your Singulair daily.  - consider Xolair monthly injections if above regimen is not effective enough.  Xolair information provided.  Will have Tammy, our Xolair coordinator, call you about starting this medication for hive control.  - can use your hydroxyzine as needed for breakthrough symptoms.  Mod persistent asthma, not well controlled - stop Symbicort (red and gray inhaler) - Daily controller medication(s): Singulair 10mg  daily and Breztri (yellow/gold inhaler) two puffs twice daily with spacer - Prior to physical activity: albuterol 2 puffs  10-15 minutes before physical activity. - Rescue medications: albuterol 2 puffs or albuterol 1 vial via nebulizer every 4-6 hours as needed - Xolair may also help with your asthma control if there is an allergic component.    - Asthma control goals:  * Full participation in all desired activities (may need albuterol before activity) * Albuterol use two time or less a week on average (not counting use with activity) * Cough interfering with sleep two time or less a month * Oral steroids no more than once a year * No hospitalizations  Rhinoconjunctivitis - as above recommend obtaining environmental allergy panel - use Xyzal as above for generalized allergy symptoms - for itchy/watery eyes use Cromolyn 1 drop each eye up to 4 times a day as needed - for nasal congestion/drainage can use Dymista 1 sprays each nostril twice a day for congestion or nasal drainage.  Dymista is nasal spray we can send to your pharmacy.  Dymista is similar  to Ryaltris.  Ryaltris sample provided today that you can use 2 sprays each nostril twice a day for nasal congestion or drainage.   Contact dermatitis - avoid jewelry that cause rash.  - continue Hydrocortisone twice a day to neck area until resolved - keep skin moisturized.    Follow-up in 3-4 months or sooner if needed  I appreciate the opportunity to take part in Aleiya's care. Please do not hesitate to contact me with questions.  Sincerely,   Margo Aye, MD Allergy/Immunology [facility]

## 2023-01-23 NOTE — Patient Instructions (Addendum)
-   at this time etiology of hives and itching is spontaneous.  Hives can be caused by a variety of different triggers including illness/infection, foods, medications, stings, exercise, pressure, vibrations, extremes of temperature to name a few however majority of the time there is no identifiable trigger.    - when able recommend getting labs done for hives: tryptase, hive panel, environmental panel, alpha-gal panel, inflammatory markers - for hive control recommend continuing Xyzal 5mg  1 tab twice a day with Pepcid 20mg  1 tab twice a day.   Keep your Singulair daily.  - consider Xolair monthly injections if above regimen is not effective enough.  Xolair information provided.  Will have Tammy, our Xolair coordinator, call you about starting this medication for hive control.  - can use your hydroxyzine as needed for breakthrough symptoms.  - stop Symbicort (red and gray inhaler) - Daily controller medication(s): Singulair 10mg  daily and Breztri (yellow/gold inhaler) two puffs twice daily with spacer - Prior to physical activity: albuterol 2 puffs 10-15 minutes before physical activity. - Rescue medications: albuterol 2 puffs or albuterol 1 vial via nebulizer every 4-6 hours as needed - Xolair may also help with your asthma control if there is an allergic component.    - Asthma control goals:  * Full participation in all desired activities (may need albuterol before activity) * Albuterol use two time or less a week on average (not counting use with activity) * Cough interfering with sleep two time or less a month * Oral steroids no more than once a year * No hospitalizations  - as above recommend obtaining environmental allergy panel - use Xyzal as above for generalized allergy symptoms - for itchy/watery eyes use Cromolyn 1 drop each eye up to 4 times a day as needed - for nasal congestion/drainage can use Dymista 1 sprays each nostril twice a day for congestion or nasal drainage.  Dymista is  nasal spray we can send to your pharmacy.  Dymista is similar to Ryaltris.  Ryaltris sample provided today that you can use 2 sprays each nostril twice a day for nasal congestion or drainage.    - avoid jewelry that cause rash.  - continue Hydrocortisone twice a day to neck area until resolved - keep skin moisturized.    Follow-up in 3-4 months or sooner if needed

## 2023-01-24 ENCOUNTER — Other Ambulatory Visit: Payer: Self-pay

## 2023-01-24 ENCOUNTER — Emergency Department (HOSPITAL_BASED_OUTPATIENT_CLINIC_OR_DEPARTMENT_OTHER)
Admission: EM | Admit: 2023-01-24 | Discharge: 2023-01-24 | Disposition: A | Discharge status: Home / Self Care | Payer: Medicare HMO | Attending: Emergency Medicine | Admitting: Emergency Medicine

## 2023-01-24 DIAGNOSIS — Z7951 Long term (current) use of inhaled steroids: Secondary | ICD-10-CM | POA: Diagnosis not present

## 2023-01-24 DIAGNOSIS — J45909 Unspecified asthma, uncomplicated: Secondary | ICD-10-CM | POA: Diagnosis not present

## 2023-01-24 DIAGNOSIS — R110 Nausea: Secondary | ICD-10-CM | POA: Diagnosis present

## 2023-01-24 DIAGNOSIS — Z794 Long term (current) use of insulin: Secondary | ICD-10-CM | POA: Diagnosis not present

## 2023-01-24 DIAGNOSIS — R739 Hyperglycemia, unspecified: Secondary | ICD-10-CM

## 2023-01-24 DIAGNOSIS — E1165 Type 2 diabetes mellitus with hyperglycemia: Secondary | ICD-10-CM | POA: Insufficient documentation

## 2023-01-24 LAB — COMPREHENSIVE METABOLIC PANEL
ALT: 23 U/L (ref 0–44)
AST: 15 U/L (ref 15–41)
Albumin: 4 g/dL (ref 3.5–5.0)
Alkaline Phosphatase: 89 U/L (ref 38–126)
Anion gap: 13 (ref 5–15)
BUN: 12 mg/dL (ref 6–20)
CO2: 23 mmol/L (ref 22–32)
Calcium: 9.1 mg/dL (ref 8.9–10.3)
Chloride: 99 mmol/L (ref 98–111)
Creatinine, Ser: 0.69 mg/dL (ref 0.44–1.00)
GFR, Estimated: >60 mL/min (ref >60)
Glucose, Bld: 395 mg/dL — ABNORMAL HIGH (ref 70–99)
Potassium: 3.6 mmol/L (ref 3.5–5.1)
Sodium: 135 mmol/L (ref 135–145)
Total Bilirubin: 0.3 mg/dL (ref 0.3–1.2)
Total Protein: 7.4 g/dL (ref 6.5–8.1)

## 2023-01-24 LAB — URINALYSIS, ROUTINE W REFLEX MICROSCOPIC
Bilirubin Urine: NEGATIVE
Glucose, UA: >1000 mg/dL — AB
Hgb urine dipstick: NEGATIVE
Ketones, ur: >80 mg/dL — AB
Leukocytes,Ua: NEGATIVE
Nitrite: NEGATIVE
Protein, ur: NEGATIVE mg/dL
Specific Gravity, Urine: 1.031 — ABNORMAL HIGH (ref 1.005–1.030)
pH: 5 (ref 5.0–8.0)

## 2023-01-24 LAB — BASIC METABOLIC PANEL
Anion gap: 11 (ref 5–15)
BUN: 13 mg/dL (ref 6–20)
CO2: 22 mmol/L (ref 22–32)
Calcium: 9.8 mg/dL (ref 8.9–10.3)
Chloride: 97 mmol/L — ABNORMAL LOW (ref 98–111)
Creatinine, Ser: 0.92 mg/dL (ref 0.44–1.00)
GFR, Estimated: >60 mL/min (ref >60)
Glucose, Bld: 517 mg/dL (ref 70–99)
Potassium: 6 mmol/L — ABNORMAL HIGH (ref 3.5–5.1)
Sodium: 130 mmol/L — ABNORMAL LOW (ref 135–145)

## 2023-01-24 LAB — CBC WITH DIFFERENTIAL/PLATELET
Abs Immature Granulocytes: 0.02 10*3/uL (ref 0.00–0.07)
Basophils Absolute: 0 10*3/uL (ref 0.0–0.1)
Basophils Relative: 1 %
Eosinophils Absolute: 0.1 10*3/uL (ref 0.0–0.5)
Eosinophils Relative: 1 %
HCT: 47.5 % — ABNORMAL HIGH (ref 36.0–46.0)
Hemoglobin: 16.1 g/dL — ABNORMAL HIGH (ref 12.0–15.0)
Immature Granulocytes: 0 %
Lymphocytes Relative: 36 %
Lymphs Abs: 2.3 10*3/uL (ref 0.7–4.0)
MCH: 30.8 pg (ref 26.0–34.0)
MCHC: 33.9 g/dL (ref 30.0–36.0)
MCV: 91 fL (ref 80.0–100.0)
Monocytes Absolute: 0.5 10*3/uL (ref 0.1–1.0)
Monocytes Relative: 8 %
Neutro Abs: 3.5 10*3/uL (ref 1.7–7.7)
Neutrophils Relative %: 54 %
Platelets: 250 10*3/uL (ref 150–400)
RBC: 5.22 MIL/uL — ABNORMAL HIGH (ref 3.87–5.11)
RDW: 13.6 % (ref 11.5–15.5)
WBC: 6.4 10*3/uL (ref 4.0–10.5)
nRBC: 0 % (ref 0.0–0.2)

## 2023-01-24 LAB — I-STAT VENOUS BLOOD GAS, ED
Acid-base deficit: 5 mmol/L — ABNORMAL HIGH (ref 0.0–2.0)
Bicarbonate: 20.3 mmol/L (ref 20.0–28.0)
Calcium, Ion: 1.25 mmol/L (ref 1.15–1.40)
HCT: 44 % (ref 36.0–46.0)
Hemoglobin: 15 g/dL (ref 12.0–15.0)
O2 Saturation: 92 %
Patient temperature: 97.9
Potassium: 4.1 mmol/L (ref 3.5–5.1)
Sodium: 133 mmol/L — ABNORMAL LOW (ref 135–145)
TCO2: 21 mmol/L — ABNORMAL LOW (ref 22–32)
pCO2, Ven: 37.6 mmHg — ABNORMAL LOW (ref 44–60)
pH, Ven: 7.339 (ref 7.25–7.43)
pO2, Ven: 68 mmHg — ABNORMAL HIGH (ref 32–45)

## 2023-01-24 LAB — PREGNANCY, URINE: Preg Test, Ur: NEGATIVE

## 2023-01-24 LAB — CBG MONITORING, ED: Glucose-Capillary: 514 mg/dL (ref 70–99)

## 2023-01-24 LAB — BETA-HYDROXYBUTYRIC ACID: Beta-Hydroxybutyric Acid: 2.13 mmol/L — ABNORMAL HIGH (ref 0.05–0.27)

## 2023-01-24 MED ORDER — LACTATED RINGERS IV BOLUS
1000.0000 mL | INTRAVENOUS | Status: AC
  Administered 2023-01-24: 1000 mL via INTRAVENOUS

## 2023-01-24 MED ORDER — FUROSEMIDE 10 MG/ML IJ SOLN
40.0000 mg | Freq: Once | INTRAMUSCULAR | Status: AC
  Administered 2023-01-24: 40 mg via INTRAVENOUS
  Filled 2023-01-24: qty 4

## 2023-01-24 MED ORDER — LACTATED RINGERS IV BOLUS
1000.0000 mL | Freq: Once | INTRAVENOUS | Status: AC
  Administered 2023-01-24: 1000 mL via INTRAVENOUS

## 2023-01-24 MED ORDER — ALUM & MAG HYDROXIDE-SIMETH 200-200-20 MG/5ML PO SUSP
30.0000 mL | Freq: Once | ORAL | Status: AC
  Administered 2023-01-24: 30 mL via ORAL
  Filled 2023-01-24: qty 30

## 2023-01-24 MED ORDER — ONDANSETRON HCL 4 MG/2ML IJ SOLN
4.0000 mg | Freq: Once | INTRAMUSCULAR | Status: AC
  Administered 2023-01-24: 4 mg via INTRAVENOUS
  Filled 2023-01-24: qty 2

## 2023-01-24 MED ORDER — SODIUM ZIRCONIUM CYCLOSILICATE 10 G PO PACK
10.0000 g | PACK | Freq: Once | ORAL | Status: AC
  Administered 2023-01-24: 10 g via ORAL
  Filled 2023-01-24: qty 1

## 2023-01-24 MED ORDER — LIDOCAINE VISCOUS HCL 2 % MT SOLN
15.0000 mL | Freq: Once | OROMUCOSAL | Status: AC
  Administered 2023-01-24: 15 mL via ORAL
  Filled 2023-01-24: qty 15

## 2023-01-24 MED ORDER — INSULIN ASPART 100 UNIT/ML IJ SOLN
10.0000 [IU] | Freq: Once | INTRAMUSCULAR | Status: AC
  Administered 2023-01-24: 10 [IU] via SUBCUTANEOUS

## 2023-01-24 NOTE — ED Notes (Signed)
Unsuccessful IV attempt, labs collected

## 2023-01-24 NOTE — ED Notes (Signed)
 RN reviewed discharge instructions with pt. Pt verbalized understanding and had no further questions. VSS upon discharge.  

## 2023-01-24 NOTE — Discharge Instructions (Addendum)
Please take your diabetics medications as prescribed. I recommend close follow-up with your PCP within 2-3 days for reevaluation.  Please do not hesitate to return to emergency department if worrisome signs symptoms we discussed become apparent.

## 2023-01-24 NOTE — ED Provider Notes (Signed)
[name] Provider Note   CSN: [id number] Arrival date & time: 01/24/23  1521     History {Add pertinent medical, surgical, social history, OB history to HPI:1} Chief Complaint  Patient presents with   Hyperglycemia    Monica Hunter is a 42 y.o. female history of diabetes on Ozempic, migraines, asthma, BMI 56 presents today for evaluation of hypoglycemia.  Patient states she checked her blood sugar at home and was found to be high.  Endorses nausea without vomiting.  States she has been going to the restroom every 30 minutes.  Patient states her primary care provider just put her back on Ozempic in the last 3 weeks.  She denies fever, chest pain, shortness of breath, abdominal pain, back pain, blood in her stool or urine.   Hyperglycemia     Past Medical History:  Diagnosis Date   Asthma    Candidal dermatitis 11/04/2021   Cornea abrasion    Diabetes mellitus    Migraines    Morbid obesity (HCC)    Past Surgical History:  Procedure Laterality Date   CERVICAL POLYPECTOMY     DILATION AND CURETTAGE OF UTERUS       Home Medications Prior to Admission medications   Medication Sig Start Date End Date Taking? Authorizing Provider  albuterol (PROVENTIL) (2.5 MG/3ML) 0.083% nebulizer solution Take 3 mLs (2.5 mg total) by nebulization every 4 (four) hours as needed for wheezing or shortness of breath. 10/23/22   Marcelyn Bruins, MD  albuterol (VENTOLIN HFA) 108 (90 Base) MCG/ACT inhaler Inhale 2 puffs into the lungs every 6 (six) hours as needed for wheezing or shortness of breath. 10/23/22   Marcelyn Bruins, MD  Azelastine-Fluticasone [facility]) 137-50 MCG/ACT SUSP Place 1 spray into the nose 2 (two) times daily as needed (runny/stuffy nose). 01/23/23   Marcelyn Bruins, MD  Budeson-Glycopyrrol-Formoterol (BREZTRI AEROSPHERE) 160-9-4.8 MCG/ACT AERO Inhale 2 puffs into the lungs in the morning and at  bedtime. 01/23/23   Marcelyn Bruins, MD  budesonide-formoterol [facility]) 160-4.5 MCG/ACT inhaler Inhale 2 puffs into the lungs 2 (two) times daily. 10/23/22   Marcelyn Bruins, MD  Continuous Blood Gluc Sensor (DEXCOM G7 SENSOR) MISC Apply one sensor to the abdomen every 10 days. Patient not taking: Reported on 10/23/2022 03/15/22   Early, Sung Amabile, NP  Continuous Glucose Receiver (DEXCOM G7 RECEIVER) DEVI Use to check BG 12/26/22   Early, Sung Amabile, NP  cromolyn (OPTICROM) 4 % ophthalmic solution Place 2 drops into both eyes 4 (four) times daily as needed (itchy/watery eyes). 10/23/22   Marcelyn Bruins, MD  diclofenac (VOLTAREN) 50 MG EC tablet Take 50 mg by mouth as needed.    [provider]  famotidine (PEPCID) 20 MG tablet TAKE 1 TABLET BY MOUTH TWICE A DAY 11/18/22   Marcelyn Bruins, MD  fluconazole (DIFLUCAN) 150 MG tablet Take 1 tablet and repeat in 72 hours 12/30/22   Jerene Bears, MD  fluoruracil [facility]) 0.5 % cream Apply topically daily. Apply 1 drop of salicylic acid with pea sized amount of fluorouracil to the top of the wart only daily using q-tip. Allow to dry and cover with bandage. 04/15/22   Tollie Eth, NP  gabapentin (NEURONTIN) 300 MG capsule Take 1 capsule (300 mg total) by mouth at bedtime. 08/13/22   Tollie Eth, NP  hydrOXYzine (ATARAX) 25 MG tablet TAKE 1 TABLET BY MOUTH EVERY 8 HOURS AS NEEDED. 11/18/22   Delorse Lek,  Pilar Grammes, MD  levocetirizine (XYZAL) 5 MG tablet Take 1 tablet (5 mg total) by mouth every evening. 10/23/22   Marcelyn Bruins, MD  montelukast (SINGULAIR) 10 MG tablet TAKE 1 TABLET BY MOUTH EVERYDAY AT BEDTIME 11/18/22   Padgett, Pilar Grammes, MD  norethindrone (MICRONOR) 0.35 MG tablet Take 1 tablet (0.35 mg total) by mouth daily. 09/05/22   Jerene Bears, MD  nystatin ointment (MYCOSTATIN) Apply 1 application. topically 2 (two) times daily. 11/29/21   Early, Sung Amabile, NP  ondansetron (ZOFRAN-ODT) 4 MG  disintegrating tablet Take 1 tablet (4 mg total) by mouth every 8 (eight) hours as needed for nausea or vomiting. 10/16/21   Marita Kansas, PA-C  Salicylic Acid (SALICYLIC ACID WART REMOVER) 27.5 % LIQD Apply 1 drop of salicylic acid with pea sized amount of fluorouracil to the top of the wart only daily using q-tip. Allow to dry and cover with bandage. 04/15/22   Tollie Eth, NP  Semaglutide,0.25 or 0.5MG /DOS, (OZEMPIC, 0.25 OR 0.5 MG/DOSE,) 2 MG/3ML SOPN Inject 0.5 mg into the skin once a week. 12/26/22   Tollie Eth, NP  Spacer/Aero-Holding Chambers (AEROCHAMBER PLUS WITH MASK) inhaler Use as directed with Metered Dose Inhaler 07/24/22   Marcelyn Bruins, MD  SUMAtriptan (IMITREX) 100 MG tablet Take 1 tablet (100 mg total) by mouth as needed for migraine. May repeat in 2 hours if headache persists or recurs. 12/26/22   Tollie Eth, NP  triamcinolone cream (KENALOG) 0.1 % Apply 1 application. topically 2 (two) times daily. To affected area(s) as needed 10/25/21   Early, Sung Amabile, NP  valACYclovir (VALTREX) 1000 MG tablet Take 1 tablet (1,000 mg total) by mouth daily. 05/13/22   Tollie Eth, NP  Vitamins A & D (VITAMIN A & D) ointment Apply 1 application. topically as needed for dry skin. Apply twice a day to rectum and vaginal area with nystatin cream. 10/25/21   Early, Sung Amabile, NP      Allergies    Ceftriaxone, Methocarbamol, and Sulfa antibiotics    Review of Systems   Review of Systems Negative except as per HPI.  Physical Exam Updated Vital Signs BP 138/82 (BP Location: Right Arm)   Pulse (!) 101   Temp 97.9 F (36.6 C)   Resp 16   Ht 5\' 8"  (1.727 m)   SpO2 97%   BMI 56.29 kg/m  Physical Exam Vitals and nursing note reviewed.  Constitutional:      Appearance: Normal appearance.  HENT:     Head: Normocephalic and atraumatic.     Mouth/Throat:     Mouth: Mucous membranes are moist.  Eyes:     General: No scleral icterus. Cardiovascular:     Rate and Rhythm: Normal rate  and regular rhythm.     Pulses: Normal pulses.     Heart sounds: Normal heart sounds.  Pulmonary:     Effort: Pulmonary effort is normal.     Breath sounds: Normal breath sounds.  Abdominal:     General: Abdomen is flat.     Palpations: Abdomen is soft.     Tenderness: There is no abdominal tenderness.  Musculoskeletal:        General: No deformity.  Skin:    General: Skin is warm.     Findings: No rash.  Neurological:     General: No focal deficit present.     Mental Status: She is alert.  Psychiatric:        Mood and  Affect: Mood normal.     ED Results / Procedures / Treatments   Labs (all labs ordered are listed, but only abnormal results are displayed) Labs Reviewed  CBC WITH DIFFERENTIAL/PLATELET - Abnormal; Notable for the following components:      Result Value   RBC 5.22 (*)    Hemoglobin 16.1 (*)    HCT 47.5 (*)    All other components within normal limits  CBG MONITORING, ED - Abnormal; Notable for the following components:   Glucose-Capillary 514 (*)    All other components within normal limits  BASIC METABOLIC PANEL  PREGNANCY, URINE  URINALYSIS, ROUTINE W REFLEX MICROSCOPIC  CBG MONITORING, ED    EKG None  Radiology No results found.  Procedures Procedures  {Document cardiac monitor, telemetry assessment procedure when appropriate:1}  Medications Ordered in ED Medications - No data to display  ED Course/ Medical Decision Making/ A&P   {   Click here for ABCD2, HEART and other calculatorsREFRESH Note before signing :1}                          Medical Decision Making Amount and/or Complexity of Data Reviewed Labs: ordered.   ***  {Document critical care time when appropriate:1} {Document review of labs and clinical decision tools ie heart score, Chads2Vasc2 etc:1}  {Document your independent review of radiology images, and any outside records:1} {Document your discussion with family members, caretakers, and with  consultants:1} {Document social determinants of health affecting pt's care:1} {Document your decision making why or why not admission, treatments were needed:1} Final Clinical Impression(s) / ED Diagnoses Final diagnoses:  None    Rx / DC Orders ED Discharge Orders     None

## 2023-02-04 ENCOUNTER — Ambulatory Visit (INDEPENDENT_AMBULATORY_CARE_PROVIDER_SITE_OTHER): Payer: Medicare HMO | Admitting: Internal Medicine

## 2023-02-04 ENCOUNTER — Encounter: Payer: Self-pay | Admitting: Internal Medicine

## 2023-02-04 ENCOUNTER — Other Ambulatory Visit: Payer: Self-pay | Admitting: Internal Medicine

## 2023-02-04 VITALS — BP 140/100 | HR 93 | Ht 68.0 in | Wt 365.8 lb

## 2023-02-04 DIAGNOSIS — Z794 Long term (current) use of insulin: Secondary | ICD-10-CM

## 2023-02-04 DIAGNOSIS — E1165 Type 2 diabetes mellitus with hyperglycemia: Secondary | ICD-10-CM | POA: Diagnosis not present

## 2023-02-04 DIAGNOSIS — B372 Candidiasis of skin and nail: Secondary | ICD-10-CM | POA: Diagnosis not present

## 2023-02-04 DIAGNOSIS — E11620 Type 2 diabetes mellitus with diabetic dermatitis: Secondary | ICD-10-CM

## 2023-02-04 LAB — POCT GLYCOSYLATED HEMOGLOBIN (HGB A1C): Hemoglobin A1C: 12.1 % — AB (ref 4.0–5.6)

## 2023-02-04 MED ORDER — DEXCOM G7 SENSOR MISC
3 refills | Status: DC
Start: 2023-02-04 — End: 2023-07-17

## 2023-02-04 MED ORDER — INSULIN GLARGINE 100 UNITS/ML SOLOSTAR PEN: 24.0000 [IU] | PEN_INJECTOR | Freq: Every day | SUBCUTANEOUS | 3 refills | Status: DC

## 2023-02-04 MED ORDER — FLUCONAZOLE 150 MG PO TABS
ORAL_TABLET | ORAL | 0 refills | Status: DC
Start: 2023-02-04 — End: 2023-02-12

## 2023-02-04 MED ORDER — INSULIN PEN NEEDLE 32G X 4 MM MISC: 1.0000 | Freq: Every day | 3 refills | Status: DC

## 2023-02-04 MED ORDER — TIRZEPATIDE 2.5 MG/0.5ML ~~LOC~~ SOAJ: 2.5000 mg | SUBCUTANEOUS | 3 refills | Status: DC

## 2023-02-04 NOTE — Progress Notes (Signed)
Name: Monica Hunter  MRN/ DOB: [id number], July 01, 1981   Age/ Sex: 41 y.o., female    PCP: Early, Sung Amabile, NP   Reason for Endocrinology Evaluation: Type 2 Diabetes Mellitus     Date of Initial Endocrinology Visit: 08/05/2022    PATIENT IDENTIFIER: Monica Hunter is a 42 y.o. female with a past medical history of DM and Adthma . The patient presented for initial endocrinology clinic visit on 08/05/2022 for consultative assistance with her diabetes management.    HPI: Monica Hunter was    Diagnosed with DM years  Prior Medications tried/Intolerance: Metformin-diarrhea , Ozempic - vomiting       Hemoglobin A1c has ranged from 6.9% in 2023, peaking at 14.2% in 2023.  Has hx of DKA's in the past , last admission 09/2021  In terms of diet, the patient eats 2 meals a day.    Has overactive bladder with incontinence   On her initial visit to our clinic she had an A1c of 7.5%, she had Trulicity on her medication list that she has not taken for approximately 3 months prior to her presentation.  Which we restarted    SUBJECTIVE:   During the last visit (08/05/2022): A1c 7.5%    Today (02/04/23): Monica Hunter is here for follow-up on diabetes management.  She stopped using the Dexcom after her insurance stopped covering.   She presented to the ED with hypoglycemia on 01/24/2023 serum glucose 514 mg/DL, she was recently restarted on Ozempic through her PCP approximately 3 weeks prior to her presentation to the ED.   Patient with overactive bladder and  incontinence, patient continues with urinary frequency as well as polydipsia  She has nausea with the Ozempic and heartburn  Has noted vaginal irritation, requesting Diflucan refill x 2, she has noted boils on the genital area Has mild constipation followed by diarrhea      HOME DIABETES REGIMEN: Ozempic  0.5 mg weekly     Statin: no ACE-I/ARB: no   CONTINUOUS GLUCOSE MONITORING RECORD INTERPRETATION  n/a     DIABETIC COMPLICATIONS: Microvascular complications:   Denies: CKD, retinopathic  Last eye exam: Completed 2023  Macrovascular complications:   Denies: CAD, PVD, CVA   PAST HISTORY: Past Medical History:  Past Medical History:  Diagnosis Date   Asthma    Candidal dermatitis 11/04/2021   Cornea abrasion    Diabetes mellitus    Migraines    Morbid obesity (HCC)    Past Surgical History:  Past Surgical History:  Procedure Laterality Date   CERVICAL POLYPECTOMY     DILATION AND CURETTAGE OF UTERUS      Social History:  reports that she has quit smoking. Her smoking use included cigarettes. She has been exposed to tobacco smoke. She has never used smokeless tobacco. She reports that she does not drink alcohol and does not use drugs. Family History:  Family History  Problem Relation Age of Onset   Allergic rhinitis Mother    Diabetes Mother    Sinusitis Mother    Sinusitis Father    Allergic rhinitis Father    Diabetes Father    Sinusitis Sister    Allergic rhinitis Sister    Infertility Sister    Sinusitis Brother    Allergic rhinitis Brother    Sinusitis Brother    Asthma Brother    Allergic rhinitis Brother    Cancer Maternal Grandmother 24       unknown type, metastatic   Diabetes Paternal Grandmother  Asthma Maternal Aunt    COPD Maternal Aunt    Breast cancer Maternal Aunt 30 - 1       unilat. mast.   Cancer Other        unknown type; one dx in 87s   Immunodeficiency Neg Hx    Eczema Neg Hx    Angioedema Neg Hx    Atopy Neg Hx    Urticaria Neg Hx      HOME MEDICATIONS: Allergies as of 02/04/2023       Reactions   Ceftriaxone Hives, Itching   Methocarbamol Itching, Rash   Sulfa Antibiotics Other (See Comments), Hives, Itching, Rash   Reaction unknown        Medication List        Accurate as of February 04, 2023  3:02 PM. If you have any questions, ask your nurse or doctor.          STOP taking these medications    Ozempic  (0.25 or 0.5 MG/DOSE) 2 MG/3ML Sopn Generic drug: Semaglutide(0.25 or 0.5MG /DOS) Stopped by: Johnney Ou        TAKE these medications    aerochamber plus with mask inhaler Use as directed with Metered Dose Inhaler   albuterol (2.5 MG/3ML) 0.083% nebulizer solution Commonly known as: PROVENTIL Take 3 mLs (2.5 mg total) by nebulization every 4 (four) hours as needed for wheezing or shortness of breath.   albuterol 108 (90 Base) MCG/ACT inhaler Commonly known as: VENTOLIN HFA Inhale 2 puffs into the lungs every 6 (six) hours as needed for wheezing or shortness of breath.   Azelastine-Fluticasone 137-50 MCG/ACT Susp Commonly known as: Dymista Place 1 spray into the nose 2 (two) times daily as needed (runny/stuffy nose).   Breztri Aerosphere 160-9-4.8 MCG/ACT Aero Generic drug: Budeson-Glycopyrrol-Formoterol Inhale 2 puffs into the lungs in the morning and at bedtime.   budesonide-formoterol 160-4.5 MCG/ACT inhaler Commonly known as: Symbicort Inhale 2 puffs into the lungs 2 (two) times daily.   cromolyn 4 % ophthalmic solution Commonly known as: OPTICROM Place 2 drops into both eyes 4 (four) times daily as needed (itchy/watery eyes).   Dexcom G7 Receiver Devi Use to check BG   Dexcom G7 Sensor Misc Apply one sensor to the abdomen every 10 days.   diclofenac 50 MG EC tablet Commonly known as: VOLTAREN Take 50 mg by mouth as needed.   famotidine 20 MG tablet Commonly known as: PEPCID TAKE 1 TABLET BY MOUTH TWICE A DAY   fluconazole 150 MG tablet Commonly known as: DIFLUCAN Take 1 tablet and repeat in 72 hours   fluoruracil 0.5 % cream Commonly known as: CARAC Apply topically daily. Apply 1 drop of salicylic acid with pea sized amount of fluorouracil to the top of the wart only daily using q-tip. Allow to dry and cover with bandage.   gabapentin 300 MG capsule Commonly known as: NEURONTIN Take 1 capsule (300 mg total) by mouth at bedtime.   hydrOXYzine  25 MG tablet Commonly known as: ATARAX TAKE 1 TABLET BY MOUTH EVERY 8 HOURS AS NEEDED.   insulin glargine 100 unit/mL Sopn Commonly known as: LANTUS Inject 24 Units into the skin daily. Started by: Johnney Ou    Insulin Pen Needle 32G X 4 MM Misc 1 Device by Does not apply route daily in the afternoon. Started by: Johnney Ou    levocetirizine 5 MG tablet Commonly known as: XYZAL Take 1 tablet (5 mg total) by mouth every evening.   montelukast 10 MG tablet  Commonly known as: SINGULAIR TAKE 1 TABLET BY MOUTH EVERYDAY AT BEDTIME   norethindrone 0.35 MG tablet Commonly known as: MICRONOR Take 1 tablet (0.35 mg total) by mouth daily.   nystatin ointment Commonly known as: MYCOSTATIN Apply 1 application. topically 2 (two) times daily.   ondansetron 4 MG disintegrating tablet Commonly known as: ZOFRAN-ODT Take 1 tablet (4 mg total) by mouth every 8 (eight) hours as needed for nausea or vomiting.   Salicylic Acid Wart Remover 27.5 % Liqd Generic drug: Salicylic Acid Apply 1 drop of salicylic acid with pea sized amount of fluorouracil to the top of the wart only daily using q-tip. Allow to dry and cover with bandage.   SUMAtriptan 100 MG tablet Commonly known as: IMITREX Take 1 tablet (100 mg total) by mouth as needed for migraine. May repeat in 2 hours if headache persists or recurs.   tirzepatide 2.5 MG/0.5ML Pen Commonly known as: MOUNJARO Inject 2.5 mg into the skin once a week. Started by: Johnney Ou    triamcinolone cream 0.1 % Commonly known as: KENALOG Apply 1 application. topically 2 (two) times daily. To affected area(s) as needed   valACYclovir 1000 MG tablet Commonly known as: VALTREX Take 1 tablet (1,000 mg total) by mouth daily.   vitamin A & D ointment Apply 1 application. topically as needed for dry skin. Apply twice a day to rectum and vaginal area with nystatin cream.         ALLERGIES: Allergies  Allergen Reactions    Ceftriaxone Hives and Itching   Methocarbamol Itching and Rash   Sulfa Antibiotics Other (See Comments), Hives, Itching and Rash    Reaction unknown     REVIEW OF SYSTEMS: A comprehensive ROS was conducted with the patient and is negative except as per HPI     OBJECTIVE:   VITAL SIGNS: BP (!) 140/100   Pulse 93   Ht 5\' 8"  (1.727 m)   Wt (!) 365 lb 12.8 oz (165.9 kg)   SpO2 97%   BMI 55.62 kg/m    PHYSICAL EXAM:  General: Pt appears well and is in NAD  Neck: General: Supple without adenopathy or carotid bruits. Thyroid: Thyroid size normal.  No goiter or nodules appreciated.   Lungs: Clear with good BS bilat with no rales, rhonchi, or wheezes  Heart: RRR   Abdomen:  soft, nontender  Extremities:  Lower extremities - No pretibial edema.  Neuro: MS is good with appropriate affect, pt is alert and Ox3    DM foot exam: 08/05/2022  The skin of the feet is intact without sores or ulcerations. The pedal pulses are 2+ on right and 2+ on left. The sensation is intact to a screening 5.07, 10 gram monofilament bilaterally    DATA REVIEWED:  Lab Results  Component Value Date   HGBA1C 12.1 (A) 02/04/2023   HGBA1C 7.5 (A) 08/05/2022   HGBA1C 6.9 (H) 04/17/2022  Old records , labs and images have been reviewed.      ASSESSMENT / PLAN / RECOMMENDATIONS:   1) Type 2 Diabetes Mellitus, Poorly controlled, Without complications - Most recent A1c of 12.1 %. Goal A1c < 7.0 %.    -Her A1c has trended from 7.5% to 12.1% -She has not been checking glucose at home, she does not like to use a fingerstick and would prefer Dexcom, historically insurance was not covering as she was not on insulin -She endorses nausea and heartburn with Ozempic, she has tried this x 2 with similar  side effects -She is in agreement of starting insulin as below -Will try to start her on Mounjaro due to intolerance to Ozempic -A prescription for Dexcom G7 has been sent  MEDICATIONS: Stop Ozempic Start  Mounjaro 2.5 mg weekly Start Lantus 24 units daily  EDUCATION / INSTRUCTIONS: BG monitoring instructions: Patient is instructed to check her blood sugars 3 times a day. Call [facility] if: BG persistently < 70  I reviewed the Rule of 15 for the treatment of hypoglycemia in detail with the patient. Literature supplied.   2) Diabetic complications:  Eye: Does not have known diabetic retinopathy.  Neuro/ Feet: Does not have known diabetic peripheral neuropathy. Renal: Patient does not have known baseline CKD. She is not on an ACEI/ARB at present.  3) Acute vaginitis:  -Patient with vaginal irritation and pruritus, she was treated twice with Diflucan through the GYN, she is requesting a refill of 2 more Diflucan's, patient does not want to have another culture -I did caution the patient against hepatic injury with Diflucan, and the importance of following up with GYN if her infection does not resolve  Medication Diflucan 150 mg x 2 sent to the pharmacy    Signed electronically by: Lyndle Herrlich, MD  [facility]  [facility] [address] [place] [place], [place] [address] Phone: [phone] FAX: [phone]   CC: Tollie Eth, NP [address] [place] [address] Phone: [phone]  Fax: [phone]    Return to Endocrinology clinic as below: Future Appointments  Date Time Provider Department Center  02/12/2023 10:15 AM Early, Sung Amabile, NP [facility]  05/01/2023 10:20 AM Marcelyn Bruins, MD [facility] None  07/17/2023 11:30 AM , Konrad Dolores, MD [facility] None

## 2023-02-04 NOTE — Patient Instructions (Signed)
Start Lantus 24 units once daily Stop Ozempic Start Mounjaro 2.5 mg once weekly    HOW TO TREAT LOW BLOOD SUGARS (Blood sugar LESS THAN 70 MG/DL) Please follow the RULE OF 15 for the treatment of hypoglycemia treatment (when your (blood sugars are less than 70 mg/dL)   STEP 1: Take 15 grams of carbohydrates when your blood sugar is low, which includes:  3-4 GLUCOSE TABS  OR 3-4 OZ OF JUICE OR REGULAR SODA OR ONE TUBE OF GLUCOSE GEL    STEP 2: RECHECK blood sugar in 15 MINUTES STEP 3: If your blood sugar is still low at the 15 minute recheck --> then, go back to STEP 1 and treat AGAIN with another 15 grams of carbohydrates.

## 2023-02-05 ENCOUNTER — Other Ambulatory Visit: Payer: Self-pay

## 2023-02-07 ENCOUNTER — Other Ambulatory Visit: Payer: Self-pay

## 2023-02-07 MED ORDER — TIRZEPATIDE 2.5 MG/0.5ML ~~LOC~~ SOAJ
2.5000 mg | SUBCUTANEOUS | 3 refills | Status: DC
  Filled 2023-02-07 – 2023-02-20 (×2): qty 2, 28d supply, fill #0
  Filled 2023-03-13 – 2023-03-20 (×2): qty 2, 28d supply, fill #1
  Filled 2023-04-12: qty 2, 28d supply, fill #2
  Filled 2023-05-15: qty 2, 28d supply, fill #3
  Filled 2023-07-09: qty 2, 28d supply, fill #4

## 2023-02-07 MED ORDER — INSULIN GLARGINE 100 UNITS/ML SOLOSTAR PEN
24.0000 [IU] | PEN_INJECTOR | Freq: Every day | SUBCUTANEOUS | 3 refills | Status: DC
  Filled 2023-02-07 – 2023-02-24 (×3): qty 21, 87d supply, fill #0
  Filled 2023-04-12 – 2023-05-21 (×3): qty 21, 87d supply, fill #1
  Filled 2023-07-09: qty 21, 87d supply, fill #2

## 2023-02-12 ENCOUNTER — Encounter: Payer: Self-pay | Admitting: Nurse Practitioner

## 2023-02-12 ENCOUNTER — Ambulatory Visit (INDEPENDENT_AMBULATORY_CARE_PROVIDER_SITE_OTHER): Payer: Medicare HMO | Admitting: Nurse Practitioner

## 2023-02-12 VITALS — BP 128/86 | HR 98 | Wt 358.0 lb

## 2023-02-12 DIAGNOSIS — L0291 Cutaneous abscess, unspecified: Secondary | ICD-10-CM | POA: Diagnosis not present

## 2023-02-12 DIAGNOSIS — L732 Hidradenitis suppurativa: Secondary | ICD-10-CM

## 2023-02-12 DIAGNOSIS — E11620 Type 2 diabetes mellitus with diabetic dermatitis: Secondary | ICD-10-CM

## 2023-02-12 DIAGNOSIS — K219 Gastro-esophageal reflux disease without esophagitis: Secondary | ICD-10-CM

## 2023-02-12 DIAGNOSIS — Z6841 Body Mass Index (BMI) 40.0 and over, adult: Secondary | ICD-10-CM

## 2023-02-12 DIAGNOSIS — Z794 Long term (current) use of insulin: Secondary | ICD-10-CM

## 2023-02-12 HISTORY — DX: Cutaneous abscess, unspecified: L02.91

## 2023-02-12 MED ORDER — FLUCONAZOLE 150 MG PO TABS
150.0000 mg | ORAL_TABLET | Freq: Once | ORAL | 2 refills | Status: AC
Start: 2023-02-12 — End: 2023-02-12

## 2023-02-12 MED ORDER — PANTOPRAZOLE SODIUM 40 MG PO TBEC: 40.0000 mg | DELAYED_RELEASE_TABLET | Freq: Every day | ORAL | 1 refills | Status: DC

## 2023-02-12 MED ORDER — ONDANSETRON 4 MG PO TBDP
4.0000 mg | ORAL_TABLET | Freq: Three times a day (TID) | ORAL | 3 refills | Status: DC | PRN
Start: 2023-02-12 — End: 2023-11-04

## 2023-02-12 MED ORDER — DOXYCYCLINE HYCLATE 100 MG PO TABS: 100.0000 mg | ORAL_TABLET | Freq: Two times a day (BID) | ORAL | 0 refills | Status: DC

## 2023-02-12 NOTE — Patient Instructions (Addendum)
Hot compresses on the place on your leg at least twice a day.  I am going to send in an antibiotic for this. If it doesn't go down in a week, then I will do surgery on it.  Your medication can be picked up at [facility] and [employer]    Gastroesophageal Reflux Disease, Adult Gastroesophageal reflux (GER) happens when acid from the stomach flows up into the tube that connects the mouth and the stomach (esophagus). Normally, food travels down the esophagus and stays in the stomach to be digested. However, when a person has GER, food and stomach acid sometimes move back up into the esophagus. If this becomes a more serious problem, the person may be diagnosed with a disease called gastroesophageal reflux disease (GERD). GERD occurs when the reflux: Happens often. Causes frequent or severe symptoms. Causes problems such as damage to the esophagus. When stomach acid comes in contact with the esophagus, the acid may cause inflammation in the esophagus. Over time, GERD may create small holes (ulcers) in the lining of the esophagus. What are the causes? This condition is caused by a problem with the muscle between the esophagus and the stomach (lower esophageal sphincter, or LES). Normally, the LES muscle closes after food passes through the esophagus to the stomach. When the LES is weakened or abnormal, it does not close properly, and that allows food and stomach acid to go back up into the esophagus. The LES can be weakened by certain dietary substances, medicines, and medical conditions, including: Tobacco use. Pregnancy. Having a hiatal hernia. Alcohol use. Certain foods and beverages, such as coffee, chocolate, onions, and peppermint. What increases the risk? You are more likely to develop this condition if you: Have an increased body weight. Have a connective tissue disorder. Take NSAIDs, such as ibuprofen. What are the signs or symptoms? Symptoms of this condition  include: Heartburn. Difficult or painful swallowing and the feeling of having a lump in the throat. A bitter taste in the mouth. Bad breath and having a large amount of saliva. Having an upset or bloated stomach and belching. Chest pain. Different conditions can cause chest pain. Make sure you see your health care provider if you experience chest pain. Shortness of breath or wheezing. Ongoing (chronic) cough or a nighttime cough. Wearing away of tooth enamel. Weight loss. How is this diagnosed? This condition may be diagnosed based on a medical history and a physical exam. To determine if you have mild or severe GERD, your health care provider may also monitor how you respond to treatment. You may also have tests, including: A test to examine your stomach and esophagus with a small camera (endoscopy). A test that measures the acidity level in your esophagus. A test that measures how much pressure is on your esophagus. A barium swallow or modified barium swallow test to show the shape, size, and functioning of your esophagus. How is this treated? Treatment for this condition may vary depending on how severe your symptoms are. Your health care provider may recommend: Changes to your diet. Medicine. Surgery. The goal of treatment is to help relieve your symptoms and to prevent complications. Follow these instructions at home: Eating and drinking  Follow a diet as recommended by your health care provider. This may involve avoiding foods and drinks such as: Coffee and tea, with or without caffeine. Drinks that contain alcohol. Energy drinks and sports drinks. Carbonated drinks or sodas. Chocolate and cocoa. Peppermint and mint flavorings. Garlic and onions. Horseradish.  Spicy and acidic foods, including peppers, chili powder, curry powder, vinegar, hot sauces, and barbecue sauce. Citrus fruit juices and citrus fruits, such as oranges, lemons, and limes. Tomato-based foods, such as red  sauce, chili, salsa, and pizza with red sauce. Fried and fatty foods, such as donuts, french fries, potato chips, and high-fat dressings. High-fat meats, such as hot dogs and fatty cuts of red and white meats, such as rib eye steak, sausage, ham, and bacon. High-fat dairy items, such as whole milk, butter, and cream cheese. Eat small, frequent meals instead of large meals. Avoid drinking large amounts of liquid with your meals. Avoid eating meals during the 2-3 hours before bedtime. Avoid lying down right after you eat. Do not exercise right after you eat. Lifestyle  Do not use any products that contain nicotine or tobacco. These products include cigarettes, chewing tobacco, and vaping devices, such as e-cigarettes. If you need help quitting, ask your health care provider. Try to reduce your stress by using methods such as yoga or meditation. If you need help reducing stress, ask your health care provider. If you are overweight, reduce your weight to an amount that is healthy for you. Ask your health care provider for guidance about a safe weight loss goal. General instructions Pay attention to any changes in your symptoms. Take over-the-counter and prescription medicines only as told by your health care provider. Do not take aspirin, ibuprofen, or other NSAIDs unless your health care provider told you to take these medicines. Wear loose-fitting clothing. Do not wear anything tight around your waist that causes pressure on your abdomen. Raise (elevate) the head of your bed about 6 inches (15 cm). You can use a wedge to do this. Avoid bending over if this makes your symptoms worse. Keep all follow-up visits. This is important. Contact a health care provider if: You have: New symptoms. Unexplained weight loss. Difficulty swallowing or it hurts to swallow. Wheezing or a persistent cough. A hoarse voice. Your symptoms do not improve with treatment. Get help right away if: You have sudden  pain in your arms, neck, jaw, teeth, or back. You suddenly feel sweaty, dizzy, or light-headed. You have chest pain or shortness of breath. You vomit and the vomit is green, yellow, or black, or it looks like blood or coffee grounds. You faint. You have stool that is red, bloody, or black. You cannot swallow, drink, or eat. These symptoms may represent a serious problem that is an emergency. Do not wait to see if the symptoms will go away. Get medical help right away. Call your local emergency services (911 in the [place] Do not drive yourself to the hospital. Summary Gastroesophageal reflux happens when acid from the stomach flows up into the esophagus. GERD is a disease in which the reflux happens often, causes frequent or severe symptoms, or causes problems such as damage to the esophagus. Treatment for this condition may vary depending on how severe your symptoms are. Your health care provider may recommend diet and lifestyle changes, medicine, or surgery. Contact a health care provider if you have new or worsening symptoms. Take over-the-counter and prescription medicines only as told by your health care provider. Do not take aspirin, ibuprofen, or other NSAIDs unless your health care provider told you to do so. Keep all follow-up visits as told by your health care provider. This is important. This information is not intended to replace advice given to you by your health care provider. Make sure you discuss any questions  you have with your health care provider. Document Revised: 12/25/2019 Document Reviewed: 12/27/2019 Elsevier Patient Education  2024 [employer].

## 2023-02-12 NOTE — Assessment & Plan Note (Signed)
She has had successful weight loss with the use of Ozempic, unfortunately, she has also experienced little to no appetite with the exception of ice cream. She was praised for her weight changes, but we discussed how important it is to eat a well balanced and routinely scheduled meal for blood sugar control. We also discussed the importance of proteins and fiber in her diet. She will be transitioning to [facility] in the near future, I am hopeful this will cause less GO side effects. I have sent a refill of the zofran for her to use if needed for nausea.

## 2023-02-12 NOTE — Assessment & Plan Note (Signed)
Recurrent reflux not well managed with famotidine. We discussed the importance of low fat and low carb intake, small and frequent meals, avoidance of trigger foods to help manage. Will change treatment to pantoprazole and monitor.

## 2023-02-12 NOTE — Progress Notes (Signed)
[name], DNP, AGNP-c [facility]  [address] [place], [place] [address] [phone]  ESTABLISHED PATIENT- Chronic Health and/or Follow-Up Visit  Blood pressure 128/86, pulse 98, weight (!) 358 lb (162.4 kg).    Monica Hunter is a 42 y.o. year old female presenting today for evaluation and management of chronic conditions.   Monica Hunter reports feeling poorly recently which prompted her to be seen in the ED. She was found to be in DKA at the time with blood sugars in the 500's. She has been seen by endocrinology since her discharge. Her medications have been changed to mounjaro and lantus. She reports that she is till taking the Ozempic while waiting for [facility]. She tells me since being on the Ozempic she has had very little appetite.  She reports she has cravings for "cold things" and specifies vanilla ice cream as being her biggest craving.  She tells me that she has also been craving fresh fruit.  She reports that she has not been eating meals during the day.  She has lost a considerable amount of weight with this process.  She also tells me that she has had an upset stomach which is worse when she consumes foods that are high carbohydrate or fried/fatty. She also reports heart burn.   She also reports the presence of an abscess in the right groin that has been draining until the last day or so. She reports the area is painful and tender to touch. She is not sure if this needs surgically drained in the office.   All ROS negative with exception of what is listed above.   PHYSICAL EXAM Physical Exam Vitals and nursing note reviewed.  Constitutional:      Appearance: Normal appearance. She is obese.  HENT:     Head: Normocephalic.  Eyes:     Conjunctiva/sclera: Conjunctivae normal.  Cardiovascular:     Rate and Rhythm: Normal rate and regular rhythm.     Pulses: Normal pulses.     Heart sounds: Normal heart sounds.  Pulmonary:     Effort: Pulmonary  effort is normal.     Breath sounds: Normal breath sounds.  Musculoskeletal:     Cervical back: Normal range of motion.     Comments: Right thigh abscess  Skin:    General: Skin is warm and dry.     Capillary Refill: Capillary refill takes less than 2 seconds.     Findings: Abscess present.       Neurological:     Mental Status: She is alert and oriented to person, place, and time.     Gait: Gait abnormal.  Psychiatric:        Mood and Affect: Mood normal.        Behavior: Behavior normal.     PLAN Problem List Items Addressed This Visit     Class 3 severe obesity due to excess calories without serious comorbidity with body mass index (BMI) of 50.0 to 59.9 in adult [facility]) - Primary    She has had successful weight loss with the use of Ozempic, unfortunately, she has also experienced little to no appetite with the exception of ice cream. She was praised for her weight changes, but we discussed how important it is to eat a well balanced and routinely scheduled meal for blood sugar control. We also discussed the importance of proteins and fiber in her diet. She will be transitioning to [facility] in the near future, I am hopeful this will cause less GO  side effects. I have sent a refill of the zofran for her to use if needed for nausea.       Type 2 diabetes mellitus with diabetic dermatitis, with long-term current use of insulin (HCC)    Recent hospitalization for DKA. Currently managed with endocrinology. They are working to transition her from [employer] to [facility].  We discussed the importance of eating high protein meals and snacks, even if small amounts, to help ensure appropriate control and health. We discussed snacks that would be appropriate and samples of Glucerna were provided for her to try instead of ice cream. Her labs were just completed with endocrine so we will not repeat these today. I am pleased with her weight loss, but want to make sure this is done in a safe manner for her  health. We will plan to continue to monitor.       Relevant Medications   ondansetron (ZOFRAN-ODT) 4 MG disintegrating tablet   Gastroesophageal reflux disease    Recurrent reflux not well managed with famotidine. We discussed the importance of low fat and low carb intake, small and frequent meals, avoidance of trigger foods to help manage. Will change treatment to pantoprazole and monitor.       Relevant Medications   pantoprazole (PROTONIX) 40 MG tablet   ondansetron (ZOFRAN-ODT) 4 MG disintegrating tablet   Abscess    Abscess with drainage on the left inner thigh, groin. Warmth, tenderness, and fluctuance are present. Infection is suspected at this time. We discussed treating with antibiotics and warm compresses to reduce the inflammation and infection and then reassess if the area needs drained. If it does, we will have her come in to do that. I am concerned that we risk spread of infection if done today. Will monitor closely.       Relevant Medications   doxycycline (VIBRA-TABS) 100 MG tablet   fluconazole (DIFLUCAN) 150 MG tablet   Hidradenitis suppurativa   Relevant Medications   fluconazole (DIFLUCAN) 150 MG tablet    Return in about 3 months (around 05/15/2023).  Time: 41 minutes, >50% spent counseling, care coordination, chart review, and documentation.    [name], DNP, AGNP-c

## 2023-02-12 NOTE — Assessment & Plan Note (Signed)
Recent hospitalization for DKA. Currently managed with endocrinology. They are working to transition her from [employer] to [place].  We discussed the importance of eating high protein meals and snacks, even if small amounts, to help ensure appropriate control and health. We discussed snacks that would be appropriate and samples of Glucerna were provided for her to try instead of ice cream. Her labs were just completed with endocrine so we will not repeat these today. I am pleased with her weight loss, but want to make sure this is done in a safe manner for her health. We will plan to continue to monitor.

## 2023-02-12 NOTE — Assessment & Plan Note (Signed)
Abscess with drainage on the left inner thigh, groin. Warmth, tenderness, and fluctuance are present. Infection is suspected at this time. We discussed treating with antibiotics and warm compresses to reduce the inflammation and infection and then reassess if the area needs drained. If it does, we will have her come in to do that. I am concerned that we risk spread of infection if done today. Will monitor closely.

## 2023-02-20 ENCOUNTER — Other Ambulatory Visit: Payer: Self-pay

## 2023-02-21 ENCOUNTER — Other Ambulatory Visit: Payer: Self-pay | Admitting: Medical Genetics

## 2023-02-21 DIAGNOSIS — Z006 Encounter for examination for normal comparison and control in clinical research program: Secondary | ICD-10-CM

## 2023-02-24 ENCOUNTER — Other Ambulatory Visit: Payer: Self-pay

## 2023-02-28 ENCOUNTER — Other Ambulatory Visit: Payer: Self-pay

## 2023-03-17 ENCOUNTER — Other Ambulatory Visit: Payer: Self-pay

## 2023-03-17 DIAGNOSIS — Z794 Long term (current) use of insulin: Secondary | ICD-10-CM | POA: Insufficient documentation

## 2023-03-17 DIAGNOSIS — N764 Abscess of vulva: Secondary | ICD-10-CM | POA: Diagnosis not present

## 2023-03-17 DIAGNOSIS — Z9889 Other specified postprocedural states: Secondary | ICD-10-CM | POA: Diagnosis not present

## 2023-03-17 DIAGNOSIS — E119 Type 2 diabetes mellitus without complications: Secondary | ICD-10-CM | POA: Insufficient documentation

## 2023-03-17 DIAGNOSIS — N83202 Unspecified ovarian cyst, left side: Secondary | ICD-10-CM | POA: Insufficient documentation

## 2023-03-17 DIAGNOSIS — E669 Obesity, unspecified: Secondary | ICD-10-CM | POA: Diagnosis not present

## 2023-03-18 ENCOUNTER — Encounter (HOSPITAL_BASED_OUTPATIENT_CLINIC_OR_DEPARTMENT_OTHER): Payer: Self-pay | Admitting: Emergency Medicine

## 2023-03-18 ENCOUNTER — Other Ambulatory Visit: Payer: Self-pay

## 2023-03-18 ENCOUNTER — Emergency Department (HOSPITAL_BASED_OUTPATIENT_CLINIC_OR_DEPARTMENT_OTHER): Payer: Medicare HMO

## 2023-03-18 ENCOUNTER — Emergency Department (HOSPITAL_BASED_OUTPATIENT_CLINIC_OR_DEPARTMENT_OTHER)
Admission: EM | Admit: 2023-03-18 | Discharge: 2023-03-18 | Disposition: A | Discharge status: Home / Self Care | Payer: Medicare HMO | Attending: Emergency Medicine | Admitting: Emergency Medicine

## 2023-03-18 DIAGNOSIS — N764 Abscess of vulva: Secondary | ICD-10-CM | POA: Diagnosis not present

## 2023-03-18 DIAGNOSIS — R9341 Abnormal radiologic findings on diagnostic imaging of renal pelvis, ureter, or bladder: Secondary | ICD-10-CM | POA: Diagnosis not present

## 2023-03-18 LAB — BASIC METABOLIC PANEL
Anion gap: 8 (ref 5–15)
BUN: 11 mg/dL (ref 6–20)
CO2: 29 mmol/L (ref 22–32)
Calcium: 8.8 mg/dL — ABNORMAL LOW (ref 8.9–10.3)
Chloride: 103 mmol/L (ref 98–111)
Creatinine, Ser: 0.44 mg/dL (ref 0.44–1.00)
GFR, Estimated: >60 mL/min (ref >60)
Glucose, Bld: 232 mg/dL — ABNORMAL HIGH (ref 70–99)
Potassium: 3.3 mmol/L — ABNORMAL LOW (ref 3.5–5.1)
Sodium: 140 mmol/L (ref 135–145)

## 2023-03-18 LAB — CBC WITH DIFFERENTIAL/PLATELET
Abs Immature Granulocytes: 0.02 10*3/uL (ref 0.00–0.07)
Basophils Absolute: 0 10*3/uL (ref 0.0–0.1)
Basophils Relative: 0 %
Eosinophils Absolute: 0.1 10*3/uL (ref 0.0–0.5)
Eosinophils Relative: 2 %
HCT: 39 % (ref 36.0–46.0)
Hemoglobin: 13.1 g/dL (ref 12.0–15.0)
Immature Granulocytes: 0 %
Lymphocytes Relative: 35 %
Lymphs Abs: 2.9 10*3/uL (ref 0.7–4.0)
MCH: 30.2 pg (ref 26.0–34.0)
MCHC: 33.6 g/dL (ref 30.0–36.0)
MCV: 89.9 fL (ref 80.0–100.0)
Monocytes Absolute: 0.6 10*3/uL (ref 0.1–1.0)
Monocytes Relative: 8 %
Neutro Abs: 4.6 10*3/uL (ref 1.7–7.7)
Neutrophils Relative %: 55 %
Platelets: 290 10*3/uL (ref 150–400)
RBC: 4.34 MIL/uL (ref 3.87–5.11)
RDW: 12.9 % (ref 11.5–15.5)
WBC: 8.2 10*3/uL (ref 4.0–10.5)
nRBC: 0 % (ref 0.0–0.2)

## 2023-03-18 LAB — CBG MONITORING, ED: Glucose-Capillary: 230 mg/dL — ABNORMAL HIGH (ref 70–99)

## 2023-03-18 LAB — HCG, SERUM, QUALITATIVE: Preg, Serum: NEGATIVE

## 2023-03-18 MED ORDER — OXYCODONE-ACETAMINOPHEN 5-325 MG PO TABS: 1.0000 | ORAL_TABLET | Freq: Once | ORAL | Status: DC

## 2023-03-18 MED ORDER — LIDOCAINE-EPINEPHRINE (PF) 2 %-1:200000 IJ SOLN
20.0000 mL | Freq: Once | INTRAMUSCULAR | Status: AC
  Administered 2023-03-18: 20 mL
  Filled 2023-03-18: qty 20

## 2023-03-18 MED ORDER — HYDROMORPHONE HCL 1 MG/ML IJ SOLN
1.0000 mg | Freq: Once | INTRAMUSCULAR | Status: AC
  Administered 2023-03-18: 1 mg via INTRAMUSCULAR
  Filled 2023-03-18: qty 1

## 2023-03-18 MED ORDER — DOXYCYCLINE HYCLATE 100 MG PO TABS
100.0000 mg | ORAL_TABLET | Freq: Once | ORAL | Status: AC
  Administered 2023-03-18: 100 mg via ORAL
  Filled 2023-03-18: qty 1

## 2023-03-18 MED ORDER — DOXYCYCLINE HYCLATE 100 MG PO CAPS: 100.0000 mg | ORAL_CAPSULE | Freq: Two times a day (BID) | ORAL | 0 refills | Status: DC

## 2023-03-18 MED ORDER — AMOXICILLIN-POT CLAVULANATE 875-125 MG PO TABS
1.0000 | ORAL_TABLET | Freq: Once | ORAL | Status: AC
  Administered 2023-03-18: 1 via ORAL
  Filled 2023-03-18: qty 1

## 2023-03-18 MED ORDER — IOHEXOL 350 MG/ML SOLN
100.0000 mL | Freq: Once | INTRAVENOUS | Status: AC | PRN
  Administered 2023-03-18: 100 mL via INTRAVENOUS

## 2023-03-18 MED ORDER — AMOXICILLIN-POT CLAVULANATE 875-125 MG PO TABS: 1.0000 | ORAL_TABLET | Freq: Two times a day (BID) | ORAL | 0 refills | Status: DC

## 2023-03-18 NOTE — ED Notes (Signed)
Reviewed AVS with patient, patient expressed understanding of directions, denies further questions at this time.

## 2023-03-18 NOTE — ED Triage Notes (Signed)
Patient reports labial abscess x3 days stating it looks like a "flat golf ball". Hx of recurrent abscesses.  Has been trying to get it to pop at home with no relief.

## 2023-03-18 NOTE — ED Provider Notes (Signed)
[place] EMERGENCY DEPARTMENT AT [facility] Provider Note   CSN: [id number] Arrival date & time: 03/17/23  2358     History  Chief Complaint  Patient presents with   Abscess    Monica Hunter is a 42 y.o. female.  Patient with a history of diabetes, obesity, recurrent abscesses here with suspected abscess to left labia for the past 3 days.  No bleeding or drainage.  Has had increased pain and swelling over the past several days.  States it feels like a golf ball.  No fever, chills, nausea or vomiting.  No chest pain or shortness of breath.  No pain with urination or blood in the urine.  No abdominal pain or back pain.  Sugars have been well-controlled at home.  The history is provided by the patient.  Abscess Associated symptoms: no fever, no headaches, no nausea and no vomiting        Home Medications Prior to Admission medications   Medication Sig Start Date End Date Taking? Authorizing Provider  albuterol (PROVENTIL) (2.5 MG/3ML) 0.083% nebulizer solution Take 3 mLs (2.5 mg total) by nebulization every 4 (four) hours as needed for wheezing or shortness of breath. 10/23/22   Marcelyn Bruins, MD  albuterol (VENTOLIN HFA) 108 (90 Base) MCG/ACT inhaler Inhale 2 puffs into the lungs every 6 (six) hours as needed for wheezing or shortness of breath. 10/23/22   Marcelyn Bruins, MD  Azelastine-Fluticasone [facility]) 137-50 MCG/ACT SUSP Place 1 spray into the nose 2 (two) times daily as needed (runny/stuffy nose). 01/23/23   Marcelyn Bruins, MD  Budeson-Glycopyrrol-Formoterol (BREZTRI AEROSPHERE) 160-9-4.8 MCG/ACT AERO Inhale 2 puffs into the lungs in the morning and at bedtime. 01/23/23   Marcelyn Bruins, MD  budesonide-formoterol [facility]) 160-4.5 MCG/ACT inhaler Inhale 2 puffs into the lungs 2 (two) times daily. 10/23/22   Marcelyn Bruins, MD  Continuous Glucose Receiver (DEXCOM G7 RECEIVER) DEVI Use to check BG 12/26/22    Early, Sung Amabile, NP  Continuous Glucose Sensor (DEXCOM G7 SENSOR) MISC Apply one sensor to the abdomen every 10 days. 02/04/23   Shamleffer, Konrad Dolores, MD  cromolyn (OPTICROM) 4 % ophthalmic solution Place 2 drops into both eyes 4 (four) times daily as needed (itchy/watery eyes). 10/23/22   Marcelyn Bruins, MD  diclofenac (VOLTAREN) 50 MG EC tablet Take 50 mg by mouth as needed.    [provider]  doxycycline (VIBRA-TABS) 100 MG tablet Take 1 tablet (100 mg total) by mouth 2 (two) times daily. 02/12/23   Tollie Eth, NP  famotidine (PEPCID) 20 MG tablet TAKE 1 TABLET BY MOUTH TWICE A DAY Patient not taking: Reported on 02/12/2023 11/18/22   Marcelyn Bruins, MD  fluoruracil [facility]) 0.5 % cream Apply topically daily. Apply 1 drop of salicylic acid with pea sized amount of fluorouracil to the top of the wart only daily using q-tip. Allow to dry and cover with bandage. 04/15/22   Tollie Eth, NP  gabapentin (NEURONTIN) 300 MG capsule Take 1 capsule (300 mg total) by mouth at bedtime. Patient not taking: Reported on 02/12/2023 08/13/22   Early, Sung Amabile, NP  hydrOXYzine (ATARAX) 25 MG tablet TAKE 1 TABLET BY MOUTH EVERY 8 HOURS AS NEEDED. 11/18/22   Marcelyn Bruins, MD  insulin glargine (LANTUS) 100 unit/mL SOPN Inject 24 Units into the skin daily. 02/07/23   Shamleffer, Konrad Dolores, MD  Insulin Pen Needle 32G X 4 MM MISC 1 Device by Does not apply route daily  in the afternoon. 02/04/23   Shamleffer, Konrad Dolores, MD  levocetirizine (XYZAL) 5 MG tablet Take 1 tablet (5 mg total) by mouth every evening. 10/23/22   Marcelyn Bruins, MD  montelukast (SINGULAIR) 10 MG tablet TAKE 1 TABLET BY MOUTH EVERYDAY AT BEDTIME 11/18/22   Padgett, Pilar Grammes, MD  norethindrone (MICRONOR) 0.35 MG tablet Take 1 tablet (0.35 mg total) by mouth daily. 09/05/22   Jerene Bears, MD  nystatin ointment (MYCOSTATIN) Apply 1 application. topically 2 (two) times daily. 11/29/21    Tollie Eth, NP  ondansetron (ZOFRAN-ODT) 4 MG disintegrating tablet Take 1 tablet (4 mg total) by mouth every 8 (eight) hours as needed for nausea or vomiting. 02/12/23   Early, Sung Amabile, NP  pantoprazole (PROTONIX) 40 MG tablet Take 1 tablet (40 mg total) by mouth daily. 02/12/23   Tollie Eth, NP  Salicylic Acid (SALICYLIC ACID WART REMOVER) 27.5 % LIQD Apply 1 drop of salicylic acid with pea sized amount of fluorouracil to the top of the wart only daily using q-tip. Allow to dry and cover with bandage. 04/15/22   Tollie Eth, NP  Spacer/Aero-Holding Chambers (AEROCHAMBER PLUS WITH MASK) inhaler Use as directed with Metered Dose Inhaler 07/24/22   Marcelyn Bruins, MD  SUMAtriptan (IMITREX) 100 MG tablet Take 1 tablet (100 mg total) by mouth as needed for migraine. May repeat in 2 hours if headache persists or recurs. 12/26/22   Tollie Eth, NP  tirzepatide [facility]) 2.5 MG/0.5ML Pen Inject 2.5 mg into the skin once a week. Patient not taking: Reported on 02/12/2023 02/07/23   Shamleffer, Konrad Dolores, MD  triamcinolone cream (KENALOG) 0.1 % Apply 1 application. topically 2 (two) times daily. To affected area(s) as needed 10/25/21   Early, Sung Amabile, NP  valACYclovir (VALTREX) 1000 MG tablet Take 1 tablet (1,000 mg total) by mouth daily. 05/13/22   Tollie Eth, NP  Vitamins A & D (VITAMIN A & D) ointment Apply 1 application. topically as needed for dry skin. Apply twice a day to rectum and vaginal area with nystatin cream. 10/25/21   Early, Sung Amabile, NP      Allergies    Ceftriaxone, Methocarbamol, and Sulfa antibiotics    Review of Systems   Review of Systems  Constitutional:  Negative for activity change, appetite change and fever.  HENT:  Negative for congestion and rhinorrhea.   Respiratory:  Negative for cough, chest tightness and shortness of breath.   Cardiovascular:  Negative for chest pain.  Gastrointestinal:  Negative for abdominal pain, nausea and vomiting.  Genitourinary:   Negative for dysuria and hematuria.  Musculoskeletal:  Negative for arthralgias and myalgias.  Skin:  Positive for wound.  Neurological:  Negative for weakness and headaches.   all other systems are negative except as noted in the HPI and PMH.    Physical Exam Updated Vital Signs BP (!) 155/107 (BP Location: Left Arm)   Pulse 90   Temp 98.4 F (36.9 C)   Resp 19   Ht 5\' 8"  (1.727 m)   Wt (!) 162.4 kg   SpO2 95%   BMI 54.44 kg/m  Physical Exam Vitals and nursing note reviewed.  Constitutional:      General: She is not in acute distress.    Appearance: She is well-developed.  HENT:     Head: Normocephalic and atraumatic.     Mouth/Throat:     Pharynx: No oropharyngeal exudate.  Eyes:     Conjunctiva/sclera: Conjunctivae  normal.     Pupils: Pupils are equal, round, and reactive to light.  Neck:     Comments: No meningismus. Cardiovascular:     Rate and Rhythm: Normal rate and regular rhythm.     Heart sounds: Normal heart sounds. No murmur heard. Pulmonary:     Effort: Pulmonary effort is normal. No respiratory distress.     Breath sounds: Normal breath sounds.  Abdominal:     Palpations: Abdomen is soft.     Tenderness: There is no abdominal tenderness. There is no guarding or rebound.  Genitourinary:    Comments: Chaperone present Psychiatrist throughout entirety of history, exam and procedure.  There is extensive swelling and induration and fluctuance of left labia majora.  Approximately 4 cm of induration with central fluctuance and surrounding erythema. Musculoskeletal:        General: No tenderness. Normal range of motion.     Cervical back: Normal range of motion and neck supple.  Skin:    General: Skin is warm.  Neurological:     Mental Status: She is alert and oriented to person, place, and time.     Cranial Nerves: No cranial nerve deficit.     Motor: No abnormal muscle tone.     Coordination: Coordination normal.     Comments:  5/5 strength throughout.  CN 2-12 intact.Equal grip strength.   Psychiatric:        Behavior: Behavior normal.     ED Results / Procedures / Treatments   Labs (all labs ordered are listed, but only abnormal results are displayed) Labs Reviewed  BASIC METABOLIC PANEL - Abnormal; Notable for the following components:      Result Value   Potassium 3.3 (*)    Glucose, Bld 232 (*)    Calcium 8.8 (*)    All other components within normal limits  CBG MONITORING, ED - Abnormal; Notable for the following components:   Glucose-Capillary 230 (*)    All other components within normal limits  CBC WITH DIFFERENTIAL/PLATELET  HCG, SERUM, QUALITATIVE  URINALYSIS, ROUTINE W REFLEX MICROSCOPIC    EKG None  Radiology CT PELVIS W CONTRAST  Result Date: 03/18/2023 CLINICAL DATA:  Soft tissue infection suspected. Left pubic labial abscess status post I and D. Rule out necrotizing infection or deeper infection. EXAM: CT PELVIS WITH CONTRAST TECHNIQUE: Multidetector CT imaging of the pelvis was performed using the standard protocol following the bolus administration of intravenous contrast. RADIATION DOSE REDUCTION: This exam was performed according to the departmental dose-optimization program which includes automated exposure control, adjustment of the mA and/or kV according to patient size and/or use of iterative reconstruction technique. CONTRAST:  OMNIPAQUE IOHEXOL 350 MG/ML SOLN COMPARISON:  CT 01/10/2022 FINDINGS: Urinary Tract:  No abnormality visualized. Bowel:  Unremarkable visualized pelvic bowel loops. Vascular/Lymphatic: No pathologically enlarged lymph nodes. No significant vascular abnormality seen. Reproductive: Unremarkable right ovary and uterus. 4.4 cm left adnexal cyst. No follow-up imaging recommended. Note: This recommendation does not apply to premenarchal patients and to those with increased risk (genetic, family history, elevated tumor markers or other high-risk factors) of ovarian cancer. Reference: JACR  2020 Feb; 17(2):248-254 Other: Thickening and inflammatory stranding in the left labia. 3.7 x 2.3 cm focus of soft tissue thickening with a few locules of gas compatible with phlegmon status post recent I and D. no evidence of necrotizing infection or deep space infection. Musculoskeletal: No suspicious bone lesions identified. IMPRESSION: Left labial stranding and soft tissue thickening with a few locules  of gas compatible with phlegmon status post recent I and D. No evidence of necrotizing infection or deep space infection. Electronically Signed   By: Minerva Fester M.D.   On: 03/18/2023 03:14    Procedures .Marland KitchenIncision and Drainage  Date/Time: 03/18/2023 1:21 AM  Performed by: Glynn Octave, MD Authorized by: Glynn Octave, MD   Consent:    Consent obtained:  Verbal   Consent given by:  Patient   Risks, benefits, and alternatives were discussed: yes     Risks discussed:  Bleeding, damage to other organs, infection, incomplete drainage and pain   Alternatives discussed:  No treatment Universal protocol:    Procedure explained and questions answered to patient or proxy's satisfaction: yes     Immediately prior to procedure, a time out was called: yes     Patient identity confirmed:  Verbally with patient Location:    Type:  Abscess   Size:  4   Location:  Anogenital   Anogenital location:  Vulva Pre-procedure details:    Skin preparation:  Chlorhexidine Sedation:    Sedation type:  Anxiolysis Anesthesia:    Anesthesia method:  Local infiltration   Local anesthetic:  Lidocaine 2% WITH epi Procedure type:    Complexity:  Complex Procedure details:    Incision types:  Elliptical   Incision depth:  Subcutaneous   Wound management:  Probed and deloculated, irrigated with saline and extensive cleaning   Drainage:  Purulent   Drainage amount:  Copious   Wound treatment:  Wound left open   Packing materials:  1/2 in iodoform gauze Post-procedure details:    Procedure  completion:  Tolerated well, no immediate complications     Medications Ordered in ED Medications  lidocaine-EPINEPHrine (XYLOCAINE W/EPI) 2 %-1:200000 (PF) injection 20 mL (20 mLs Infiltration Given 03/18/23 0049)  HYDROmorphone (DILAUDID) injection 1 mg (1 mg Intramuscular Given 03/18/23 0108)    ED Course/ Medical Decision Making/ A&P                                 Medical Decision Making Amount and/or Complexity of Data Reviewed Labs: ordered. Decision-making details documented in ED Course. Radiology: ordered and independent interpretation performed. Decision-making details documented in ED Course. ECG/medicine tests: ordered and independent interpretation performed. Decision-making details documented in ED Course.  Risk Prescription drug management.   Obese diabetic with recurrent labial abscess.  Vitals are stable.  No distress.  Abdomen soft without peritoneal signs.  Incision and drainage performed as above.  Low suspicion for necrotizing infection.  Labs show hyperglycemia without evidence of DKA.  CT scan obtained given difficulty with exam due to body habitus.  No obvious necrotizing infection or deep space infection seen.  Discussed local wound care with warm soaks in the tub, packing removal in 2 days, wound check in 2 days, take the antibiotics as prescribed.  Follow-up with PCP for wound check in 2 days.  Return to the ED with new or worsening symptoms.        Final Clinical Impression(s) / ED Diagnoses Final diagnoses:  Labial abscess    Rx / DC Orders ED Discharge Orders     None         Gustavia Carie, Jeannett Senior, MD 03/18/23 [phone]

## 2023-03-18 NOTE — Discharge Instructions (Signed)
Take the antibiotics as prescribed.  Perform warm soaks in the tub twice daily.  Remove the packing in 2 days if still present.  Return to the ED sooner with worsening redness, pain, drainage, fever, swelling or any other concerns.

## 2023-03-19 ENCOUNTER — Other Ambulatory Visit: Payer: Self-pay

## 2023-03-20 ENCOUNTER — Other Ambulatory Visit: Payer: Self-pay

## 2023-04-01 ENCOUNTER — Ambulatory Visit (HOSPITAL_BASED_OUTPATIENT_CLINIC_OR_DEPARTMENT_OTHER): Payer: Medicare HMO | Admitting: Advanced Practice Midwife

## 2023-04-01 VITALS — BP 115/65 | HR 92 | Ht 68.0 in | Wt 355.4 lb

## 2023-04-01 DIAGNOSIS — L0291 Cutaneous abscess, unspecified: Secondary | ICD-10-CM

## 2023-04-01 DIAGNOSIS — E11620 Type 2 diabetes mellitus with diabetic dermatitis: Secondary | ICD-10-CM

## 2023-04-01 DIAGNOSIS — M545 Low back pain, unspecified: Secondary | ICD-10-CM

## 2023-04-01 DIAGNOSIS — Z6841 Body Mass Index (BMI) 40.0 and over, adult: Secondary | ICD-10-CM

## 2023-04-01 DIAGNOSIS — B372 Candidiasis of skin and nail: Secondary | ICD-10-CM

## 2023-04-01 DIAGNOSIS — G8929 Other chronic pain: Secondary | ICD-10-CM | POA: Diagnosis not present

## 2023-04-01 DIAGNOSIS — Z794 Long term (current) use of insulin: Secondary | ICD-10-CM

## 2023-04-01 MED ORDER — DOXYCYCLINE HYCLATE 100 MG PO CAPS
100.0000 mg | ORAL_CAPSULE | Freq: Two times a day (BID) | ORAL | 0 refills | Status: DC
Start: 2023-04-01 — End: 2023-05-15

## 2023-04-01 MED ORDER — NYSTATIN 100000 UNIT/GM EX OINT: 1.0000 | TOPICAL_OINTMENT | Freq: Two times a day (BID) | CUTANEOUS | 6 refills | Status: AC

## 2023-04-01 MED ORDER — BACITRACIN 500 UNIT/GM EX OINT: 1.0000 | TOPICAL_OINTMENT | Freq: Two times a day (BID) | CUTANEOUS | 0 refills | Status: AC

## 2023-04-01 MED ORDER — AMOXICILLIN-POT CLAVULANATE 875-125 MG PO TABS
1.0000 | ORAL_TABLET | Freq: Two times a day (BID) | ORAL | 0 refills | Status: DC
Start: 2023-04-01 — End: 2023-05-15

## 2023-04-01 MED ORDER — FLUCONAZOLE 150 MG PO TABS
ORAL_TABLET | ORAL | 3 refills | Status: DC
Start: 2023-04-01 — End: 2023-11-04

## 2023-04-01 NOTE — Progress Notes (Signed)
   GYNECOLOGY PROGRESS NOTE  History:  42 y.o. G0P0000 presents to [facility] office today for problem gyn visit. She reports a boil on her left labia, drained in the ED on 03/18/23, another boil that presented after that visit on her left buttock, vaginal irritation associated with her blood glucose being high,  .  She denies h/a, dizziness, shortness of breath, n/v, or fever/chills.    The following portions of the patient's history were reviewed and updated as appropriate: allergies, current medications, past family history, past medical history, past social history, past surgical history and problem list. Last pap smear on 03/05/22 was normal,  HRHPV.  Health Maintenance Due  Topic Date Due   Medicare Annual Wellness (AWV)  Never done   Diabetic kidney evaluation - Urine ACR  Never done   Hepatitis C Screening  Never done   DTaP/Tdap/Td (1 - Tdap) Never done   INFLUENZA VACCINE  01/30/2023   COVID-19 Vaccine (3 - 2023-24 season) 03/02/2023   OPHTHALMOLOGY EXAM  03/28/2023     Review of Systems:  Pertinent items are noted in HPI.   Objective:  Physical Exam Blood pressure 115/65, pulse 92, height 5\' 8"  (1.727 m), weight (!) 355 lb 6.4 oz (161.2 kg), last menstrual period 03/11/2023. VS reviewed, nursing note reviewed,  Constitutional: well developed, well nourished, no distress HEENT: normocephalic CV: normal rate Pulm/chest wall: normal effort Breast Exam: deferred Abdomen: soft Neuro: alert and oriented x 3 Skin: warm, dry Psych: affect normal Pelvic exam: Cervix pink, visually closed, without lesion, scant white creamy discharge, vaginal walls and external genitalia normal Bimanual exam: Cervix 0/long/high, firm, anterior, neg CMT, uterus nontender, nonenlarged, adnexa without tenderness, enlargement, or mass  Assessment & Plan:  1. Type 2 diabetes mellitus with diabetic dermatitis, with long-term current use of insulin (HCC) --Pt wearing Dexcom glucose monitor, glucose  improved in the last week, with average value 116, but was hospitalized in July for hyperglycemia.  Is working with PCP to improve her DM and lose weight.   --When her glucose is high, she has skin symptoms, including boils, yeast, vaginal irritation. Pt prefers to treat all these at one time, because treating one at a time separately does not work for her.  2. Chronic midline low back pain without sciatica --Pt wants to be more active, had PT in the past that helped but stopped when started having migraines. Migraines are improved, so she would like to try again.  - Ambulatory referral to Physical Therapy  3. Candidal dermatitis  - nystatin ointment (MYCOSTATIN); Apply 1 Application topically 2 (two) times daily.  Dispense: 30 g; Refill: 6 - fluconazole (DIFLUCAN) 150 MG tablet; Take one tablet today, and one in 3 days. Repeat in 2 weeks if symptoms return.  Dispense: 2 tablet; Refill: 3  4. Cutaneous abscess, unspecified site  - amoxicillin-clavulanate (AUGMENTIN) 875-125 MG tablet; Take 1 tablet by mouth every 12 (twelve) hours.  Dispense: 14 tablet; Refill: 0 - doxycycline (VIBRAMYCIN) 100 MG capsule; Take 1 capsule (100 mg total) by mouth 2 (two) times daily.  Dispense: 14 capsule; Refill: 0  5. Body mass index (BMI) 50.0-59.9, adult (HCC)    No follow-ups on file.   Sharen Counter, CNM 1:38 PM

## 2023-04-12 ENCOUNTER — Other Ambulatory Visit: Payer: Self-pay

## 2023-04-12 ENCOUNTER — Other Ambulatory Visit: Payer: Self-pay | Admitting: Nurse Practitioner

## 2023-04-14 ENCOUNTER — Other Ambulatory Visit: Payer: Self-pay

## 2023-04-14 NOTE — Telephone Encounter (Signed)
Last apt 02/12/23 I did not see were we have filled this before.

## 2023-04-15 MED ORDER — DICLOFENAC SODIUM 50 MG PO TBEC
50.0000 mg | DELAYED_RELEASE_TABLET | ORAL | 6 refills | Status: DC | PRN
  Filled 2023-04-15: qty 30, 30d supply, fill #0
  Filled 2023-08-30: qty 30, 30d supply, fill #1

## 2023-04-16 ENCOUNTER — Other Ambulatory Visit: Payer: Self-pay

## 2023-04-21 ENCOUNTER — Other Ambulatory Visit: Payer: Self-pay

## 2023-04-21 ENCOUNTER — Telehealth: Payer: Self-pay

## 2023-04-21 NOTE — Telephone Encounter (Signed)
Transition Care Management Unsuccessful Follow-up Telephone Call  Date of discharge and from where:  [facility] 9/17  Attempts:  1st Attempt  Reason for unsuccessful TCM follow-up call:  No answer/busy   Lenard Forth [place]  [facility], [facility], Phone: [phone] Website: [email or web address]

## 2023-04-22 ENCOUNTER — Telehealth: Payer: Self-pay

## 2023-04-22 NOTE — Telephone Encounter (Signed)
Transition Care Management Follow-up Telephone Call Date of discharge and from where: [facility] 9/17 How have you been since you were released from the hospital? Doing fine and has followed up with OBGYN Any questions or concerns? No  Items Reviewed: Did the pt receive and understand the discharge instructions provided? Yes  Medications obtained and verified? Yes  Other? No  Any new allergies since your discharge? No  Dietary orders reviewed? No Do you have support at home? Yes    Follow up appointments reviewed:  PCP Hospital f/u appt confirmed? No  Scheduled to see  on  @ . Specialist Hospital f/u appt confirmed? Yes  Scheduled to see OBGYN on 9/18 @ . Are transportation arrangements needed? No  If their condition worsens, is the pt aware to call PCP or go to the Emergency Dept.? Yes Was the patient provided with contact information for the PCP's office or ED? Yes Was to pt encouraged to call back with questions or concerns? Yes

## 2023-05-01 ENCOUNTER — Ambulatory Visit (INDEPENDENT_AMBULATORY_CARE_PROVIDER_SITE_OTHER): Payer: Medicare HMO | Admitting: Allergy

## 2023-05-01 ENCOUNTER — Other Ambulatory Visit: Payer: Self-pay

## 2023-05-01 ENCOUNTER — Encounter: Payer: Self-pay | Admitting: Allergy

## 2023-05-01 ENCOUNTER — Other Ambulatory Visit: Payer: Self-pay | Admitting: Allergy

## 2023-05-01 VITALS — BP 128/86 | HR 76 | Temp 97.8°F | Resp 16 | Ht 68.0 in | Wt 363.9 lb

## 2023-05-01 DIAGNOSIS — J4540 Moderate persistent asthma, uncomplicated: Secondary | ICD-10-CM

## 2023-05-01 DIAGNOSIS — H1013 Acute atopic conjunctivitis, bilateral: Secondary | ICD-10-CM

## 2023-05-01 DIAGNOSIS — L2481 Irritant contact dermatitis due to metals: Secondary | ICD-10-CM | POA: Diagnosis not present

## 2023-05-01 DIAGNOSIS — L508 Other urticaria: Secondary | ICD-10-CM

## 2023-05-01 DIAGNOSIS — J310 Chronic rhinitis: Secondary | ICD-10-CM | POA: Diagnosis not present

## 2023-05-01 DIAGNOSIS — H109 Unspecified conjunctivitis: Secondary | ICD-10-CM

## 2023-05-01 MED ORDER — LEVOCETIRIZINE DIHYDROCHLORIDE 5 MG PO TABS: 5.0000 mg | ORAL_TABLET | Freq: Two times a day (BID) | ORAL | 4 refills | Status: DC

## 2023-05-01 MED ORDER — MONTELUKAST SODIUM 10 MG PO TABS: 10.0000 mg | ORAL_TABLET | Freq: Every day | ORAL | 1 refills | Status: DC

## 2023-05-01 MED ORDER — BREZTRI AEROSPHERE 160-9-4.8 MCG/ACT IN AERO: 2.0000 | INHALATION_SPRAY | Freq: Two times a day (BID) | RESPIRATORY_TRACT | 4 refills | Status: DC

## 2023-05-01 MED ORDER — ALBUTEROL SULFATE HFA 108 (90 BASE) MCG/ACT IN AERS: 2.0000 | INHALATION_SPRAY | Freq: Four times a day (QID) | RESPIRATORY_TRACT | 1 refills | Status: DC | PRN

## 2023-05-01 MED ORDER — CROMOLYN SODIUM 4 % OP SOLN: 1.0000 [drp] | Freq: Four times a day (QID) | OPHTHALMIC | 4 refills | Status: DC | PRN

## 2023-05-01 MED ORDER — OXYMETAZOLINE HCL 0.05 % NA SOLN: NASAL | 0 refills | Status: DC

## 2023-05-01 MED ORDER — ALBUTEROL SULFATE (2.5 MG/3ML) 0.083% IN NEBU: 2.5000 mg | INHALATION_SOLUTION | RESPIRATORY_TRACT | 1 refills | Status: DC | PRN

## 2023-05-01 MED ORDER — FAMOTIDINE 20 MG PO TABS: 20.0000 mg | ORAL_TABLET | Freq: Two times a day (BID) | ORAL | 1 refills | Status: DC | PRN

## 2023-05-01 MED ORDER — AZELASTINE-FLUTICASONE 137-50 MCG/ACT NA SUSP: NASAL | 4 refills | Status: DC

## 2023-05-01 NOTE — Patient Instructions (Addendum)
-   when able recommend obtaining environmental allergy panel - use Xyzal (levocetirizine) 5mg  1-2 tabs daily depending on symptom severity - for itchy/watery eyes use Cromolyn 1 drop each eye up to 4 times a day as needed - for severe nasal congestion right now use Afrin 2 sprays each nostril twice a day for next 3 days.  After use wait 5-15 minutes or until you can breathe more freely through the nose and then go in with your Dymista.   After 3 days continue Dymista use.  - for nasal congestion/drainage use Dymista 1 sprays each nostril twice a day for congestion or nasal drainage.    - at this time etiology of hives and itching is spontaneous.  Hives can be caused by a variety of different triggers including illness/infection, foods, medications, stings, exercise, pressure, vibrations, extremes of temperature to name a few however majority of the time there is no identifiable trigger.    - when able recommend getting labs done for hives: tryptase, hive panel, environmental panel, alpha-gal panel, inflammatory markers - for hive control recommend use Xyzal 5mg  1 tab twice a day with Pepcid 20mg  1 tab twice a day.   Keep your Singulair daily.  - consider Xolair monthly injections if above regimen is not effective enough.  Xolair information provided.  Will have Tammy, our Xolair coordinator, call you about starting this medication for hive control.  - can use your hydroxyzine as needed for breakthrough symptoms.  - Daily controller medication(s): Singulair 10mg  daily and Breztri (yellow/gold inhaler) two puffs twice daily with spacer - Prior to physical activity: albuterol 2 puffs 10-15 minutes before physical activity. - Rescue medications: albuterol 2 puffs or albuterol 1 vial via nebulizer every 4-6 hours as needed - Xolair may also help with your asthma control if there is an allergic component.    - Asthma control goals:  * Full participation in all desired activities (may need albuterol  before activity) * Albuterol use two time or less a week on average (not counting use with activity) * Cough interfering with sleep two time or less a month * Oral steroids no more than once a year * No hospitalizations  - avoid jewelry that cause rash.  - continue Hydrocortisone twice a day to neck area until resolved - keep skin moisturized.    Follow-up in 3-4 months or sooner if needed

## 2023-05-01 NOTE — Progress Notes (Signed)
Follow-up Note  RE: Monica Hunter MRN: [id number] DOB: March 11, 1981 Date of Office Visit: 05/01/2023   History of present illness: Monica Hunter is a 42 y.o. female presenting today for follow-up of urticaria, asthma, allergic rhinitis with conjunctivitis, contact dermatitis.  She was last seen in the office on 01/23/2023 by myself.  Discussed the use of AI scribe software for clinical note transcription with the patient, who gave verbal consent to proceed.  The patient, with a known history of allergies, presents with worsening nasal congestion and difficulty breathing through the nose for the past week. She reports that her eyes are watering and she has been forced to breathe through her mouth due to the severity of the nasal blockage. She has been out of her nasal spray Dymista for about a week. She denies any chest tightness, coughing, wheezing, or shortness of breath.  However states she has also been using an albuterol rescue inhaler, which she reports using yesterday, but not frequently, perhaps once a day at most.  She has been on Singulair (montelukast) for her allergies.  Does not currently have the levocetirizine.   She also reports using a yellow inhaler Markus Daft) twice a day, along with her Singulair. The patient denies any recent hives. She was recently in the hospital for an labial abscess and is currently on antibiotics. She denies any recent fevers or chills.     Review of systems: 10pt ROS negative unless noted above in HPI   All other systems negative unless noted above in HPI  Past medical/social/surgical/family history have been reviewed and are unchanged unless specifically indicated below.  No changes  Medication List: Current Outpatient Medications  Medication Sig Dispense Refill   amoxicillin-clavulanate (AUGMENTIN) 875-125 MG tablet Take 1 tablet by mouth every 12 (twelve) hours. 14 tablet 0   bacitracin 500 UNIT/GM ointment Apply 1 Application topically  2 (two) times daily. 15 g 0   Continuous Glucose Receiver (DEXCOM G7 RECEIVER) DEVI Use to check BG 1 each 0   Continuous Glucose Sensor (DEXCOM G7 SENSOR) MISC Apply one sensor to the abdomen every 10 days. 9 each 3   diclofenac (VOLTAREN) 50 MG EC tablet Take 1 tablet (50 mg total) by mouth as needed. 30 tablet 6   doxycycline (VIBRAMYCIN) 100 MG capsule Take 1 capsule (100 mg total) by mouth 2 (two) times daily. 14 capsule 0   fluconazole (DIFLUCAN) 150 MG tablet Take one tablet today, and one in 3 days. Repeat in 2 weeks if symptoms return. 2 tablet 3   fluoruracil (CARAC) 0.5 % cream Apply topically daily. Apply 1 drop of salicylic acid with pea sized amount of fluorouracil to the top of the wart only daily using q-tip. Allow to dry and cover with bandage. 30 g 0   hydrOXYzine (ATARAX) 25 MG tablet TAKE 1 TABLET BY MOUTH EVERY 8 HOURS AS NEEDED. 270 tablet 2   insulin glargine (LANTUS) 100 unit/mL SOPN Inject 24 Units into the skin daily. 30 mL 3   Insulin Pen Needle 32G X 4 MM MISC 1 Device by Does not apply route daily in the afternoon. 100 each 3   norethindrone (MICRONOR) 0.35 MG tablet Take 1 tablet (0.35 mg total) by mouth daily. 84 tablet 1   nystatin ointment (MYCOSTATIN) Apply 1 Application topically 2 (two) times daily. 30 g 6   ondansetron (ZOFRAN-ODT) 4 MG disintegrating tablet Take 1 tablet (4 mg total) by mouth every 8 (eight) hours as needed for nausea or vomiting. 30  tablet 3   oxymetazoline (AFRIN) 0.05 % nasal spray 2 sprays each nostril twice a day for next 3 days.  Wait until you can breathe through nose then use Dymista 30 mL 0   pantoprazole (PROTONIX) 40 MG tablet Take 1 tablet (40 mg total) by mouth daily. 90 tablet 1   Salicylic Acid (SALICYLIC ACID WART REMOVER) 27.5 % LIQD Apply 1 drop of salicylic acid with pea sized amount of fluorouracil to the top of the wart only daily using q-tip. Allow to dry and cover with bandage. 10 mL 1   Spacer/Aero-Holding Chambers  (AEROCHAMBER PLUS WITH MASK) inhaler Use as directed with Metered Dose Inhaler 1 each 1   SUMAtriptan (IMITREX) 100 MG tablet Take 1 tablet (100 mg total) by mouth as needed for migraine. May repeat in 2 hours if headache persists or recurs. 10 tablet 0   tirzepatide (MOUNJARO) 2.5 MG/0.5ML Pen Inject 2.5 mg into the skin once a week. 6 mL 3   triamcinolone cream (KENALOG) 0.1 % Apply 1 application. topically 2 (two) times daily. To affected area(s) as needed 30 g 3   valACYclovir (VALTREX) 1000 MG tablet Take 1 tablet (1,000 mg total) by mouth daily. 30 tablet 6   Vitamins A & D (VITAMIN A & D) ointment Apply 1 application. topically as needed for dry skin. Apply twice a day to rectum and vaginal area with nystatin cream. 45 g 3   albuterol (PROVENTIL) (2.5 MG/3ML) 0.083% nebulizer solution Take 3 mLs (2.5 mg total) by nebulization every 4 (four) hours as needed for wheezing or shortness of breath. 75 mL 1   albuterol (VENTOLIN HFA) 108 (90 Base) MCG/ACT inhaler Inhale 2 puffs into the lungs every 6 (six) hours as needed for wheezing or shortness of breath. 18 g 1   Azelastine-Fluticasone (DYMISTA) 137-50 MCG/ACT SUSP 1 spray each nostril twice a day for congestion or nasal drainage. 23 g 4   Budeson-Glycopyrrol-Formoterol (BREZTRI AEROSPHERE) 160-9-4.8 MCG/ACT AERO Inhale 2 puffs into the lungs in the morning and at bedtime. 10.7 g 4   budesonide-formoterol (SYMBICORT) 160-4.5 MCG/ACT inhaler Inhale 2 puffs into the lungs 2 (two) times daily. (Patient not taking: Reported on 05/01/2023) 1 each 5   cromolyn (OPTICROM) 4 % ophthalmic solution Place 1 drop into both eyes 4 (four) times daily as needed (itchy/watery eyes). 10 mL 4   famotidine (PEPCID) 20 MG tablet Take 1 tablet (20 mg total) by mouth 2 (two) times daily as needed (hives). 180 tablet 1   gabapentin (NEURONTIN) 300 MG capsule Take 1 capsule (300 mg total) by mouth at bedtime. (Patient not taking: Reported on 05/01/2023) 30 capsule 3    levocetirizine (XYZAL) 5 MG tablet Take 1 tablet (5 mg total) by mouth 2 (two) times daily. 60 tablet 4   montelukast (SINGULAIR) 10 MG tablet Take 1 tablet (10 mg total) by mouth at bedtime. 90 tablet 1   No current facility-administered medications for this visit.     Known medication allergies: Allergies  Allergen Reactions   Ceftriaxone Hives and Itching   Methocarbamol Itching and Rash   Sulfa Antibiotics Other (See Comments), Hives, Itching and Rash    Reaction unknown     Physical examination: Blood pressure 128/86, pulse 76, temperature 97.8 F (36.6 C), temperature source Temporal, resp. rate 16, height 5\' 8"  (1.727 m), weight (!) 363 lb 14.4 oz (165.1 kg), last menstrual period 03/11/2023, SpO2 96%.  General: Alert, interactive, in no acute distress. HEENT: PERRLA, TMs pearly gray,  turbinates markedly edematous with thick discharge, post-pharynx non erythematous. Neck: Supple without lymphadenopathy. Lungs: Clear to auscultation without wheezing, rhonchi or rales. {no increased work of breathing. CV: Normal S1, S2 without murmurs. Abdomen: Nondistended, nontender. Skin: Warm and dry, without lesions or rashes. Extremities:  No clubbing, cyanosis or edema. Neuro:   Grossly intact.  Diagnositics/Labs: None today  Assessment and plan: Rhinoconjunctivitis  - when able recommend obtaining environmental allergy panel - use Xyzal (levocetirizine) 5mg  1-2 tabs daily depending on symptom severity - for itchy/watery eyes use Cromolyn 1 drop each eye up to 4 times a day as needed - for severe nasal congestion right now use Afrin 2 sprays each nostril twice a day for next 3 days.  After use wait 5-15 minutes or until you can breathe more freely through the nose and then go in with your Dymista.   After 3 days continue Dymista use.  - for nasal congestion/drainage use Dymista 1 sprays each nostril twice a day for congestion or nasal drainage.    Chronic urticaria - at this time  etiology of hives and itching is spontaneous.  Hives can be caused by a variety of different triggers including illness/infection, foods, medications, stings, exercise, pressure, vibrations, extremes of temperature to name a few however majority of the time there is no identifiable trigger.    - when able recommend getting labs done for hives: tryptase, hive panel, environmental panel, alpha-gal panel, inflammatory markers - for hive control recommend use Xyzal 5mg  1 tab twice a day with Pepcid 20mg  1 tab twice a day.   Keep your Singulair daily.  - consider Xolair monthly injections if above regimen is not effective enough.  Xolair information provided.  Will have Tammy, our Xolair coordinator, call you about starting this medication for hive control.  - can use your hydroxyzine as needed for breakthrough symptoms.  Moderate persistent asthma - Daily controller medication(s): Singulair 10mg  daily and Breztri (yellow/gold inhaler) two puffs twice daily with spacer - Prior to physical activity: albuterol 2 puffs 10-15 minutes before physical activity. - Rescue medications: albuterol 2 puffs or albuterol 1 vial via nebulizer every 4-6 hours as needed - Xolair may also help with your asthma control if there is an allergic component.    - Asthma control goals:  * Full participation in all desired activities (may need albuterol before activity) * Albuterol use two time or less a week on average (not counting use with activity) * Cough interfering with sleep two time or less a month * Oral steroids no more than once a year * No hospitalizations  Contact dermatitis - avoid jewelry that cause rash.  - continue Hydrocortisone twice a day to neck area until resolved - keep skin moisturized.    Follow-up in 3-4 months or sooner if needed  I appreciate the opportunity to take part in Edee's care. Please do not hesitate to contact me with questions.  Sincerely,   Margo Aye,  MD Allergy/Immunology [facility]

## 2023-05-02 ENCOUNTER — Other Ambulatory Visit (HOSPITAL_COMMUNITY): Payer: Self-pay

## 2023-05-02 ENCOUNTER — Telehealth: Payer: Self-pay

## 2023-05-02 ENCOUNTER — Other Ambulatory Visit: Payer: Medicare Other | Attending: Medical Genetics

## 2023-05-02 ENCOUNTER — Encounter: Payer: Self-pay | Admitting: Allergy

## 2023-05-02 NOTE — Telephone Encounter (Signed)
Pharmacy Patient Advocate Encounter   Received notification from Patient Pharmacy that prior authorization for Dymista 137-50MCG/ACT suspension is required/requested.   Insurance verification completed.   The patient is insured through [employer] .   Per test claim: PA required; PA submitted to above mentioned insurance via CoverMyMeds Key/confirmation #/EOC [id number] Status is pending

## 2023-05-05 ENCOUNTER — Other Ambulatory Visit (HOSPITAL_COMMUNITY): Payer: Self-pay

## 2023-05-05 NOTE — Telephone Encounter (Signed)
Forwarding message to provider for next step. 

## 2023-05-05 NOTE — Telephone Encounter (Signed)
Insurance requires patient to try alternatives of Fluticaseone, Azelastine and/or Ipratropium nasal spray. I did not see any of these in the patients chart as a previous trial.

## 2023-05-05 NOTE — Telephone Encounter (Signed)
Pharmacy Patient Advocate Encounter  Received notification from [facility] that Prior Authorization for Dymista 137-50MCG/ACT suspension has been DENIED.  Full denial letter will be uploaded to the media tab. See denial reason below.  The drug you asked for is not listed in your preferred drug list (formulary). The preferred drug(s), you may not have tried, are:  Azelastine 137 mcg (0.1%) nasal spray aerosol Cetirizine oral solution Fluticasone propionate nasal spray suspension Ipratropium bromide nasal spray Your provider needs to give [place] medical reasons why the preferred drug(s) would not work for you and/or would have bad side effects. Sometimes a preferred drug needs more review for approval.   PA #/Case ID/Reference #: [id number]   Please advise if would like to change to an alternative or proceed with an appeal

## 2023-05-06 NOTE — Telephone Encounter (Signed)
Per Provider:  I think I asked this in another thread but does her Medicaid insurance not pick up her prescription coverage?   Forwarding updated provider message to PA Team.

## 2023-05-07 ENCOUNTER — Ambulatory Visit (HOSPITAL_BASED_OUTPATIENT_CLINIC_OR_DEPARTMENT_OTHER): Payer: Medicare HMO | Attending: Advanced Practice Midwife | Admitting: Physical Therapy

## 2023-05-07 DIAGNOSIS — M25562 Pain in left knee: Secondary | ICD-10-CM | POA: Diagnosis not present

## 2023-05-07 DIAGNOSIS — M25561 Pain in right knee: Secondary | ICD-10-CM | POA: Insufficient documentation

## 2023-05-07 DIAGNOSIS — M6281 Muscle weakness (generalized): Secondary | ICD-10-CM | POA: Diagnosis not present

## 2023-05-07 DIAGNOSIS — G8929 Other chronic pain: Secondary | ICD-10-CM | POA: Insufficient documentation

## 2023-05-07 DIAGNOSIS — M5459 Other low back pain: Secondary | ICD-10-CM | POA: Insufficient documentation

## 2023-05-07 NOTE — Telephone Encounter (Signed)
Medicaid will not pick up coverage unless the primary payer covers the medication most of the time.

## 2023-05-07 NOTE — Therapy (Unsigned)
OUTPATIENT PHYSICAL THERAPY THORACOLUMBAR EVALUATION   Patient Name: Monica Hunter MRN: [id number] DOB:[birth year], 42 y.o., female Today's Date: 05/08/2023  END OF SESSION:  PT End of Session - 05/07/23 1434     Visit Number 1    Number of Visits 16    Date for PT Re-Evaluation 07/02/23    PT Start Time 1407    PT Stop Time 1430    PT Time Calculation (min) 23 min    Activity Tolerance Patient tolerated treatment well    Behavior During Therapy [facility] for tasks assessed/performed             Past Medical History:  Diagnosis Date   Asthma    Candidal dermatitis 11/04/2021   Cornea abrasion    Diabetes mellitus    Migraines    Morbid obesity (HCC)    Past Surgical History:  Procedure Laterality Date   CERVICAL POLYPECTOMY     DILATION AND CURETTAGE OF UTERUS     Patient Active Problem List   Diagnosis Date Noted   Abscess 02/12/2023   Genetic testing 01/06/2023   Bilateral occipital neuralgia 08/13/2022   Chronic bilateral low back pain without sciatica 03/17/2022   Overflow incontinence of urine 12/03/2021   Type 2 diabetes mellitus with diabetic dermatitis, with long-term current use of insulin (HCC) 11/04/2021   Oligomenorrhea 11/04/2021   Gastroesophageal reflux disease 11/04/2021   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   Asthma 02/14/2018   Class 3 severe obesity due to excess calories without serious comorbidity with body mass index (BMI) of 50.0 to 59.9 in adult (HCC) 02/14/2018   Hidradenitis suppurativa 02/14/2018    PCP: Les Pou Early NP   REFERRING PROVIDER: Estill Bamberg Leftwitch   REFERRING DIAG:  Diagnosis  M54.50,G89.29 (ICD-10-CM) - Chronic midline low back pain without sciatica    Rationale for Evaluation and Treatment: Rehabilitation  THERAPY DIAG:  Other low back pain  Muscle weakness (generalized)  Chronic pain of left knee  Chronic pain of right knee  ONSET DATE: Many years   SUBJECTIVE:                                                                                                                                                                                            SUBJECTIVE STATEMENT: Patient has a long history of low back pain.  She reports she has had lower back pain since she was a teenager.  Over the past few years she has developed significant bilateral knee pain as well.  Per patient her her general mobility is decreased.  She enjoyed walking for exercise prior but she is unable to now at  this time.  She lives on the second floor and has to go up the stairs sideways.  She feels like when she is walking downstairs her knees may buckle.  PERTINENT HISTORY:  DM II, Migraines; Morbid obesity;   PAIN:  Are you having pain? Yes: NPRS scale: 7/10 Pain location: lower middle back  Pain description: aching  Aggravating factors: mornings  Relieving factors: lay around   Yes: NPRS scale: 8/10 Pain location: Bilateral  Pain description: aching  Aggravating factors: walking/ movement  Relieving factors: not walking   PRECAUTIONS: None   RED FLAGS: None   WEIGHT BEARING RESTRICTIONS: No  FALLS:  Has patient fallen in last 6 months? Larey Seat througha deck   LIVING ENVIRONMENT:  Stairs into the house OCCUPATION:  home care   Hobbies:  Like to get out to walk   PLOF: Independent  PATIENT GOALS:  To have less pain/ To be able to walk longer  NEXT MD VISIT:  Nothing scheduled   OBJECTIVE:  Note: Objective measures were completed at Evaluation unless otherwise noted.  DIAGNOSTIC FINDINGS:  Lumbar MRI  IMPRESSION: 1. Mild lower lumbar spondylosis as described above. Mild-to-moderate bilateral neuroforaminal stenosis at L5-S1.    PATIENT SURVEYS:  Unable to provide secondary to time  SCREENING FOR RED FLAGS: Bowel or bladder incontinence: No Spinal tumors: No Cauda equina syndrome: No Compression fracture: No Abdominal aneurysm: No  COGNITION: Overall cognitive status: Within functional  limits for tasks assessed     SENSATION: Good sensation     POSTURE: rounded shoulders, forward head, and flexed trunk   PALPATION: Tenderness palpation bilateral lower lumbar paraspinals from L3-S1    LUMBAR ROM:   AROM eval  Flexion Pulling at end range  Extension Significant pain  Right lateral flexion   Left lateral flexion   Right rotation Limited 25% with pulling  Left rotation Limited 25% with pulling   (Blank rows = not tested)  LOWER EXTREMITY ROM:     Active  Right eval Left eval  Hip flexion    Hip extension    Hip abduction    Hip adduction    Hip internal rotation    Hip external rotation    Knee flexion    Knee extension    Ankle dorsiflexion    Ankle plantarflexion    Ankle inversion    Ankle eversion     (Blank rows = not tested)  LOWER EXTREMITY MMT:    MMT Right eval Left eval  Hip flexion 28.1 27.1  Hip extension    Hip abduction 22.1 30.1  Hip adduction    Hip internal rotation    Hip external rotation    Knee flexion    Knee extension 18.1 24.5  Ankle dorsiflexion    Ankle plantarflexion    Ankle inversion    Ankle eversion     (Blank rows = not tested)   FUNCTIONAL TESTS:  Increased pain transferring from sit to stand  GAIT:  Significant lateral movement.  Decreased single-leg stance time on right  DATE: Unable to    PATIENT EDUCATION:  Education details: HEP, symptom management, progression of activities, importance of cardiovascular activity. Person educated: Patient Education method: Explanation, Demonstration, Tactile cues, Verbal cues, and Handouts Education comprehension: verbalized understanding, returned demonstration, verbal cues required, tactile cues required, and needs further education  HOME EXERCISE PROGRAM: Unable to provide the secondary to time  ASSESSMENT:  CLINICAL IMPRESSION: Patient is a 42 year old with chronic history of back pain and bilateral knee  pain.  Over the past few years her  pain is decreased her ability to ambulate.  She lives on the second floor and has to go up the stairs sideways secondary to knee pain.  She has bilateral lower extremity weakness right greater than left.  She has increased  muscle tension and range lumbar rotation she has increased pain lumbar extension.  She has tenderness to palpation in lower lumbar paraspinals.  She would benefit from skilled therapy quad exercising to increase general strength and stability in her lumbar spine.   OBJECTIVE IMPAIRMENTS: Abnormal gait, decreased activity tolerance, decreased endurance, decreased mobility, difficulty walking, decreased ROM, decreased strength, postural dysfunction, obesity, and pain.   ACTIVITY LIMITATIONS: carrying, lifting, bending, sitting, standing, squatting, stairs, transfers, and locomotion level  PARTICIPATION LIMITATIONS: meal prep, cleaning, laundry, driving, shopping, and community activity  PERSONAL FACTORS: 3+ comorbidities: multi joint OA, DMII Migraines  are also affecting patient's functional outcome.   REHAB POTENTIAL: Excellent  CLINICAL DECISION MAKING: Evolving/moderate complexity  EVALUATION COMPLEXITY: Moderate   GOALS: Goals reviewed with patient? Yes  SHORT TERM GOALS: Target date: 06/05/2023        Patient will increase gross bilateral LE strength by 5 lbs  Baseline: Goal status: INITIAL  2.  Patient wil be independent with basic HEP for land and aquatics  Baseline:  Goal status: INITIAL  3.  Patient will report a 25% reduction in pain in her knees and back  Baseline:  Goal status: INITIAL   LONG TERM GOALS: Target date: 07/03/2023    Patient will have a complete program that includes cardiovascular activity and an aquatic program  Baseline:  Goal status: INITIAL  2.  Patient will go up and down 8 steps with reciprocal gait pattern with less than 2 out of 10 pain Baseline:  Goal status: INITIAL  3.  Patient will walk community distances for  exercise without increased pain Baseline:  Goal status: INITIAL  PLAN:  PT FREQUENCY: 2x/week  PT DURATION: 8 weeks  PLANNED INTERVENTIONS: Therapeutic exercises, Therapeutic activity, Neuromuscular re-education, Balance training, Gait training, Patient/Family education, Self Care, Joint mobilization, Stair training, DME instructions, Aquatic Therapy, Dry Needling, Electrical stimulation, Cryotherapy, Moist heat, Taping, Manual therapy, and Re-evaluation. Marland Kitchen  PLAN FOR NEXT SESSION:   Land: Review self soft tissue mobilization with T-cane and tennis ball; consider seated flexion stretch. Consider supine quad strengthening if she can tolerate supine. If not consider sitting series. No HEP given 2nd to patient being late.   Aquatic: work on weight bearing and gait tolerance; Hip strengthening and core stability.    Referring diagnosis?  Diagnosis  M54.50,G89.29 (ICD-10-CM) - Chronic midline low back pain without sciatica   Treatment diagnosis? (if different than referring diagnosis)  Diagnosis  M54.50,G89.29 (ICD-10-CM) - Chronic midline low back pain without sciatica   What was this (referring dx) caused by? []  Surgery []  Fall [x]  Ongoing issue [x]  Arthritis []  Other: ____________  Laterality: []  Rt []  Lt [x]  Both  Check all possible CPT codes:  *CHOOSE 10 OR LESS*   Check all possible CPT codes:  *CHOOSE 10 OR LESS*    Check all possible CPT codes:            *CHOOSE 10 OR LESS*                          []  97110 (Therapeutic Exercise)            []  92507 (SLP Treatment)  []   16109 (Neuro Re-ed)                         []  92526 (Swallowing Treatment)             []  [phone] (Gait Training)                           []  [phone] (Cognitive Training, 1st 15 minutes) []  97140 (Manual Therapy)                               []  97130 (Cognitive Training, each add'l 15 minutes)  []  97164 (Re-evaluation)                              []  Other, List CPT Code ____________  []  97530  (Therapeutic Activities)                                             []  97535 (Self Care)                    [x]  All codes above (97110 - 97535)           []  97012 (Mechanical Traction)           [x]  97014 (E-stim Unattended)           []  97032 (E-stim manual)           [x]  97033 (Ionto)           [x]  91478 (Ultrasound) []  97750 (Physical Performance Training) []  [id number] (Aquatic Therapy) []  97016 (Vasopneumatic Device) []  [id number] (Paraffin) []  97034 (Contrast Bath) []  97597 (Wound Care 1st 20 sq cm) []  97598 (Wound Care each add'l 20 sq cm) []  97760 (Orthotic Fabrication, Fitting, Training Initial) []  [id number] (Prosthetic Management and Training Initial) []  [id number] (Orthotic or Prosthetic Training/ Modification Subsequent)    See Planned Interventions listed in the Plan section of the Evaluation.   Dessie Coma, PT 05/08/2023, 8:22 AM

## 2023-05-08 ENCOUNTER — Encounter (HOSPITAL_BASED_OUTPATIENT_CLINIC_OR_DEPARTMENT_OTHER): Payer: Self-pay | Admitting: Physical Therapy

## 2023-05-08 ENCOUNTER — Other Ambulatory Visit: Payer: Self-pay | Admitting: Allergy

## 2023-05-08 ENCOUNTER — Other Ambulatory Visit (HOSPITAL_COMMUNITY): Payer: Self-pay

## 2023-05-08 MED ORDER — AZELASTINE HCL 0.15 % NA SOLN: NASAL | 5 refills | Status: DC

## 2023-05-08 MED ORDER — FLUTICASONE PROPIONATE 50 MCG/ACT NA SUSP: 2.0000 | Freq: Every day | NASAL | 4 refills | Status: DC

## 2023-05-08 NOTE — Telephone Encounter (Signed)
Per PA Team:  Medicaid will not pick up on a prescription that is not covered by the primary payor. The pharmacy or patient might be able to do an override but I'm not sure if they would be successful. In order for Medicaid to pay, her Francine Graven has to pay first.   Forwarding updated message to provider.

## 2023-05-08 NOTE — Telephone Encounter (Signed)
Per Provider:  Is the Medicare a new insurance for her?  We previously had Dymista covered and this works well for her.  Please let her know since the medicare does not cover this then her medicaid secondary is not going to cover it either.    We will need to split the spray into Flonase 2 sprays each nostril daily and Azelastine 2 sprays each nostril twice a day as needed for runny nose to see if the Medicare will cover it.  If not she will need to get these over the counter unforntunately.    Called patient - DOB/Pharmacy verified - advised of above provider/ PA notations.   Patient verbalized understanding to all, no further questions.

## 2023-05-09 ENCOUNTER — Encounter (HOSPITAL_BASED_OUTPATIENT_CLINIC_OR_DEPARTMENT_OTHER): Payer: Self-pay | Admitting: Physical Therapy

## 2023-05-09 ENCOUNTER — Ambulatory Visit (HOSPITAL_BASED_OUTPATIENT_CLINIC_OR_DEPARTMENT_OTHER): Payer: Medicare HMO | Admitting: Physical Therapy

## 2023-05-09 DIAGNOSIS — M25562 Pain in left knee: Secondary | ICD-10-CM | POA: Diagnosis not present

## 2023-05-09 DIAGNOSIS — M5459 Other low back pain: Secondary | ICD-10-CM | POA: Diagnosis not present

## 2023-05-09 DIAGNOSIS — M25561 Pain in right knee: Secondary | ICD-10-CM | POA: Diagnosis not present

## 2023-05-09 DIAGNOSIS — G8929 Other chronic pain: Secondary | ICD-10-CM

## 2023-05-09 DIAGNOSIS — M6281 Muscle weakness (generalized): Secondary | ICD-10-CM | POA: Diagnosis not present

## 2023-05-09 NOTE — Therapy (Addendum)
OUTPATIENT PHYSICAL THERAPY THORACOLUMBAR TREATMENT   Patient Name: Monica Hunter MRN: [id number] DOB:1980/10/19, 42 y.o., female Today's Date: 05/09/2023  END OF SESSION:  PT End of Session - 05/09/23 1403     Visit Number 2    Number of Visits 16    Date for PT Re-Evaluation 07/02/23    PT Start Time 1352    PT Stop Time 1430    PT Time Calculation (min) 38 min    Activity Tolerance Patient tolerated treatment well    Behavior During Therapy [facility] for tasks assessed/performed             Past Medical History:  Diagnosis Date   Asthma    Candidal dermatitis 11/04/2021   Cornea abrasion    Diabetes mellitus    Migraines    Morbid obesity (HCC)    Past Surgical History:  Procedure Laterality Date   CERVICAL POLYPECTOMY     DILATION AND CURETTAGE OF UTERUS     Patient Active Problem List   Diagnosis Date Noted   Abscess 02/12/2023   Genetic testing 01/06/2023   Bilateral occipital neuralgia 08/13/2022   Chronic bilateral low back pain without sciatica 03/17/2022   Overflow incontinence of urine 12/03/2021   Type 2 diabetes mellitus with diabetic dermatitis, with long-term current use of insulin (HCC) 11/04/2021   Oligomenorrhea 11/04/2021   Gastroesophageal reflux disease 11/04/2021   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   Asthma 02/14/2018   Class 3 severe obesity due to excess calories without serious comorbidity with body mass index (BMI) of 50.0 to 59.9 in adult (HCC) 02/14/2018   Hidradenitis suppurativa 02/14/2018    PCP: Les Pou Early NP   REFERRING PROVIDER: Estill Bamberg Leftwitch   REFERRING DIAG:  Diagnosis  M54.50,G89.29 (ICD-10-CM) - Chronic midline low back pain without sciatica    Rationale for Evaluation and Treatment: Rehabilitation  THERAPY DIAG:  Other low back pain  Muscle weakness (generalized)  Chronic pain of left knee  Chronic pain of right knee  ONSET DATE: Many years   SUBJECTIVE:                                                                                                                                                                                            SUBJECTIVE STATEMENT: Pt reports no new changes since initial eval.    FROM INITIAL EVAL: Patient has a long history of low back pain.  She reports she has had lower back pain since she was a teenager.  Over the past few years she has developed significant bilateral knee pain as well.  Per patient her her general mobility is decreased.  She enjoyed walking for exercise prior but she is unable to now at this time.  She lives on the second floor and has to go up the stairs sideways.  She feels like when she is walking downstairs her knees may buckle.  PERTINENT HISTORY:  DM II, Migraines; Morbid obesity;   PAIN:  Are you having pain? Yes: NPRS scale: 7-8/10 Pain location: lower middle back  Pain description: aching  Aggravating factors: mornings  Relieving factors: lay around    PRECAUTIONS: None   RED FLAGS: None   WEIGHT BEARING RESTRICTIONS: No  FALLS:  Has patient fallen in last 6 months? Larey Seat througha deck   LIVING ENVIRONMENT:  Stairs into the house OCCUPATION:  home care   Hobbies:  Like to get out to walk   PLOF: Independent  PATIENT GOALS:  To have less pain/ To be able to walk longer  NEXT MD VISIT:  Nothing scheduled   OBJECTIVE:  Note: Objective measures were completed at Evaluation unless otherwise noted.  DIAGNOSTIC FINDINGS:  Lumbar MRI  IMPRESSION: 1. Mild lower lumbar spondylosis as described above. Mild-to-moderate bilateral neuroforaminal stenosis at L5-S1.    PATIENT SURVEYS:  Unable to provide secondary to time  SCREENING FOR RED FLAGS: Bowel or bladder incontinence: No Spinal tumors: No Cauda equina syndrome: No Compression fracture: No Abdominal aneurysm: No  COGNITION: Overall cognitive status: Within functional limits for tasks assessed     SENSATION: Good sensation     POSTURE:  rounded shoulders, forward head, and flexed trunk   PALPATION: Tenderness palpation bilateral lower lumbar paraspinals from L3-S1    LUMBAR ROM:   AROM eval  Flexion Pulling at end range  Extension Significant pain  Right lateral flexion   Left lateral flexion   Right rotation Limited 25% with pulling  Left rotation Limited 25% with pulling   (Blank rows = not tested)  LOWER EXTREMITY ROM:     Active  Right eval Left eval  Hip flexion    Hip extension    Hip abduction    Hip adduction    Hip internal rotation    Hip external rotation    Knee flexion    Knee extension    Ankle dorsiflexion    Ankle plantarflexion    Ankle inversion    Ankle eversion     (Blank rows = not tested)  LOWER EXTREMITY MMT:    MMT Right eval Left eval  Hip flexion 28.1 27.1  Hip extension    Hip abduction 22.1 30.1  Hip adduction    Hip internal rotation    Hip external rotation    Knee flexion    Knee extension 18.1 24.5  Ankle dorsiflexion    Ankle plantarflexion    Ankle inversion    Ankle eversion     (Blank rows = not tested)   FUNCTIONAL TESTS:  Increased pain transferring from sit to stand  GAIT:  Significant lateral movement.  Decreased single-leg stance time on right TODAYS' TREATMENT:   Pt seen for aquatic therapy today.  Treatment took place in water 3.5-4.75 ft in depth at the [facility] pool. Temp of water was 91.  Pt entered/exited the pool via stairs independently with bilat rail. * intro to aquatic therapy principles * unsupported: walking forward/ backward with relaxed arms at sides - multiple reps * side stepping with arm addct/ abdct * farmer carry with rainbow hand floats under water at side, walking forward / backward * UE on wall:  relaxed squats  x 10; hip abdct/ addct x 10 * TrA set with short hollow noodle pull down to thighs   Pt requires the buoyancy and hydrostatic pressure of water for support, and to offload joints by unweighting  joint load by at least 50 % in navel deep water and by at least 75-80% in chest to neck deep water.  Viscosity of the water is needed for resistance of strengthening. Water current perturbations provides challenge to standing balance requiring increased core activation.     PATIENT EDUCATION:  Education details: HEP, symptom management, progression of activities, importance of cardiovascular activity. Person educated: Patient Education method: Explanation, Demonstration, Tactile cues, Verbal cues, and Handouts Education comprehension: verbalized understanding, returned demonstration, verbal cues required, tactile cues required, and needs further education  HOME EXERCISE PROGRAM: Unable to provide the secondary to time  ASSESSMENT:  CLINICAL IMPRESSION: Pt is confident in aquatic environment and able to take direction from therapist on deck.  She reported gradual reduction of lower back pain during session.  Issued list of area pools.  Will plan to create aquatic HEP in future session, with anticipation of transition to community pool for management of LBP.  Goals are ongoing.    Patient is a 42 year old with chronic history of back pain and bilateral knee pain.  Over the past few years her pain is decreased her ability to ambulate.  She lives on the second floor and has to go up the stairs sideways secondary to knee pain.  She has bilateral lower extremity weakness right greater than left.  She has increased  muscle tension and range lumbar rotation she has increased pain lumbar extension.  She has tenderness to palpation in lower lumbar paraspinals.  She would benefit from skilled therapy quad exercising to increase general strength and stability in her lumbar spine.   OBJECTIVE IMPAIRMENTS: Abnormal gait, decreased activity tolerance, decreased endurance, decreased mobility, difficulty walking, decreased ROM, decreased strength, postural dysfunction, obesity, and pain.   ACTIVITY  LIMITATIONS: carrying, lifting, bending, sitting, standing, squatting, stairs, transfers, and locomotion level  PARTICIPATION LIMITATIONS: meal prep, cleaning, laundry, driving, shopping, and community activity  PERSONAL FACTORS: 3+ comorbidities: multi joint OA, DMII Migraines  are also affecting patient's functional outcome.   REHAB POTENTIAL: Excellent  CLINICAL DECISION MAKING: Evolving/moderate complexity  EVALUATION COMPLEXITY: Moderate   GOALS: Goals reviewed with patient? Yes  SHORT TERM GOALS: Target date: 06/05/2023  Patient will increase gross bilateral LE strength by 5 lbs  Baseline: Goal status: INITIAL  2.  Patient wil be independent with basic HEP for land and aquatics  Baseline:  Goal status: INITIAL  3.  Patient will report a 25% reduction in pain in her knees and back  Baseline:  Goal status: INITIAL   LONG TERM GOALS: Target date: 07/03/2023    Patient will have a complete program that includes cardiovascular activity and an aquatic program  Baseline:  Goal status: INITIAL  2.  Patient will go up and down 8 steps with reciprocal gait pattern with less than 2 out of 10 pain Baseline:  Goal status: INITIAL  3.  Patient will walk community distances for exercise without increased pain Baseline:  Goal status: INITIAL  PLAN:  PT FREQUENCY: 2x/week  PT DURATION: 8 weeks  PLANNED INTERVENTIONS: Therapeutic exercises, Therapeutic activity, Neuromuscular re-education, Balance training, Gait training, Patient/Family education, Self Care, Joint mobilization, Stair training, DME instructions, Aquatic Therapy, Dry Needling, Electrical stimulation, Cryotherapy, Moist heat, Taping, Manual therapy, and Re-evaluation. Marland Kitchen  PLAN FOR NEXT SESSION:  Land: Review self soft tissue mobilization with T-cane and tennis ball; consider seated flexion stretch. Consider supine quad strengthening if she can tolerate supine. If not consider sitting series. No HEP given 2nd to  patient being late.   Aquatic: work on weight bearing and gait tolerance; Hip strengthening and core stability.   Mayer Camel, PTA 05/09/23 2:36 PM [facility] [address] [place], [place], [address] Phone: [phone]   Fax:  [phone]   Referring diagnosis?  Diagnosis  M54.50,G89.29 (ICD-10-CM) - Chronic midline low back pain without sciatica   Treatment diagnosis? (if different than referring diagnosis)  Diagnosis  M54.50,G89.29 (ICD-10-CM) - Chronic midline low back pain without sciatica   What was this (referring dx) caused by? []  Surgery []  Fall [x]  Ongoing issue [x]  Arthritis []  Other: ____________  Laterality: []  Rt []  Lt [x]  Both  Check all possible CPT codes:  *CHOOSE 10 OR LESS*   Check all possible CPT codes:  *CHOOSE 10 OR LESS*    Check all possible CPT codes:            *CHOOSE 10 OR LESS*                          []  97110 (Therapeutic Exercise)            []  92507 (SLP Treatment)  []  97112 (Neuro Re-ed)                         []  92526 (Swallowing Treatment)             []  64403 (Gait Training)                           []  [id number] (Cognitive Training, 1st 15 minutes) []  97140 (Manual Therapy)                               []  97130 (Cognitive Training, each add'l 15 minutes)  []  97164 (Re-evaluation)                              []  Other, List CPT Code ____________  []  97530 (Therapeutic Activities)                                             []  97535 (Self Care)                    [x]  All codes above (97110 - 97535)           []  97012 (Mechanical Traction)           [x]  97014 (E-stim Unattended)           []  97032 (E-stim manual)           [x]  97033 (Ionto)           [x]  47425 (Ultrasound) []  97750 (Physical Performance Training) []  [id number] (Aquatic Therapy) []  97016 (Vasopneumatic Device) []  [id number] (Paraffin) []  97034 (Contrast Bath) []  97597 (Wound Care 1st 20 sq cm) []  97598 (Wound  Care each add'l 20 sq cm) []  97760 (Orthotic Fabrication, Fitting, Training Initial) []  [id number] (Prosthetic Management and Training  Initial) []  [phone] (Orthotic or Prosthetic Training/ Modification Subsequent)    See Planned Interventions listed in the Plan section of the Evaluation.

## 2023-05-12 ENCOUNTER — Ambulatory Visit (HOSPITAL_BASED_OUTPATIENT_CLINIC_OR_DEPARTMENT_OTHER): Payer: Medicare HMO

## 2023-05-12 ENCOUNTER — Telehealth (HOSPITAL_BASED_OUTPATIENT_CLINIC_OR_DEPARTMENT_OTHER): Payer: Self-pay

## 2023-05-12 ENCOUNTER — Encounter (HOSPITAL_BASED_OUTPATIENT_CLINIC_OR_DEPARTMENT_OTHER): Payer: Self-pay

## 2023-05-12 NOTE — Telephone Encounter (Signed)
Called pt regarding missed appt today. She states she had to work. Rescheduled for 11/13.

## 2023-05-14 ENCOUNTER — Encounter (HOSPITAL_BASED_OUTPATIENT_CLINIC_OR_DEPARTMENT_OTHER): Payer: Self-pay

## 2023-05-14 ENCOUNTER — Ambulatory Visit (HOSPITAL_BASED_OUTPATIENT_CLINIC_OR_DEPARTMENT_OTHER): Payer: Medicare HMO

## 2023-05-14 DIAGNOSIS — G8929 Other chronic pain: Secondary | ICD-10-CM

## 2023-05-14 DIAGNOSIS — M25561 Pain in right knee: Secondary | ICD-10-CM | POA: Diagnosis not present

## 2023-05-14 DIAGNOSIS — M5459 Other low back pain: Secondary | ICD-10-CM

## 2023-05-14 DIAGNOSIS — M6281 Muscle weakness (generalized): Secondary | ICD-10-CM

## 2023-05-14 DIAGNOSIS — M25562 Pain in left knee: Secondary | ICD-10-CM | POA: Diagnosis not present

## 2023-05-14 NOTE — Therapy (Signed)
OUTPATIENT PHYSICAL THERAPY THORACOLUMBAR TREATMENT   Patient Name: Monica Hunter MRN: [id number] DOB:Apr 19, 1981, 42 y.o., female Today's Date: 05/14/2023  END OF SESSION:  PT End of Session - 05/14/23 1425     Visit Number 3    Number of Visits 16    Date for PT Re-Evaluation 07/02/23    PT Start Time 1420   arrived late   PT Stop Time 1500    PT Time Calculation (min) 40 min    Activity Tolerance Patient tolerated treatment well    Behavior During Therapy [facility] for tasks assessed/performed              Past Medical History:  Diagnosis Date   Asthma    Candidal dermatitis 11/04/2021   Cornea abrasion    Diabetes mellitus    Migraines    Morbid obesity (HCC)    Past Surgical History:  Procedure Laterality Date   CERVICAL POLYPECTOMY     DILATION AND CURETTAGE OF UTERUS     Patient Active Problem List   Diagnosis Date Noted   Abscess 02/12/2023   Genetic testing 01/06/2023   Bilateral occipital neuralgia 08/13/2022   Chronic bilateral low back pain without sciatica 03/17/2022   Overflow incontinence of urine 12/03/2021   Type 2 diabetes mellitus with diabetic dermatitis, with long-term current use of insulin (HCC) 11/04/2021   Oligomenorrhea 11/04/2021   Gastroesophageal reflux disease 11/04/2021   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   Asthma 02/14/2018   Class 3 severe obesity due to excess calories without serious comorbidity with body mass index (BMI) of 50.0 to 59.9 in adult (HCC) 02/14/2018   Hidradenitis suppurativa 02/14/2018    PCP: Les Pou Early NP   REFERRING PROVIDER: Estill Bamberg Leftwitch   REFERRING DIAG:  Diagnosis  M54.50,G89.29 (ICD-10-CM) - Chronic midline low back pain without sciatica    Rationale for Evaluation and Treatment: Rehabilitation  THERAPY DIAG:  Other low back pain  Muscle weakness (generalized)  Chronic pain of left knee  Chronic pain of right knee  ONSET DATE: Many years   SUBJECTIVE:                                                                                                                                                                                            SUBJECTIVE STATEMENT: Pt reports increased pain in mid low back in the mornings. 5-6/10 pain level. She reports she ran from the car since she was running late for her appt. This did not bother her knees too much. "I thought I was in the pool today, so I went to the locker room and changed clothes, then I  changed back." Pt reports some arm soreness after aquatic session last visit, no increased pain in knees or back.    FROM INITIAL EVAL: Patient has a long history of low back pain.  She reports she has had lower back pain since she was a teenager.  Over the past few years she has developed significant bilateral knee pain as well.  Per patient her her general mobility is decreased.  She enjoyed walking for exercise prior but she is unable to now at this time.  She lives on the second floor and has to go up the stairs sideways.  She feels like when she is walking downstairs her knees may buckle.  PERTINENT HISTORY:  DM II, Migraines; Morbid obesity;   PAIN:  Are you having pain? Yes: NPRS scale: 7-8/10 Pain location: lower middle back  Pain description: aching  Aggravating factors: mornings  Relieving factors: lay around    PRECAUTIONS: None   RED FLAGS: None   WEIGHT BEARING RESTRICTIONS: No  FALLS:  Has patient fallen in last 6 months? Larey Seat througha deck   LIVING ENVIRONMENT:  Stairs into the house OCCUPATION:  home care   Hobbies:  Like to get out to walk   PLOF: Independent  PATIENT GOALS:  To have less pain/ To be able to walk longer  NEXT MD VISIT:  Nothing scheduled   OBJECTIVE:  Note: Objective measures were completed at Evaluation unless otherwise noted.  DIAGNOSTIC FINDINGS:  Lumbar MRI  IMPRESSION: 1. Mild lower lumbar spondylosis as described above. Mild-to-moderate bilateral neuroforaminal stenosis  at L5-S1.    PATIENT SURVEYS:  Unable to provide secondary to time  SCREENING FOR RED FLAGS: Bowel or bladder incontinence: No Spinal tumors: No Cauda equina syndrome: No Compression fracture: No Abdominal aneurysm: No  COGNITION: Overall cognitive status: Within functional limits for tasks assessed     SENSATION: Good sensation     POSTURE: rounded shoulders, forward head, and flexed trunk   PALPATION: Tenderness palpation bilateral lower lumbar paraspinals from L3-S1    LUMBAR ROM:   AROM eval  Flexion Pulling at end range  Extension Significant pain  Right lateral flexion   Left lateral flexion   Right rotation Limited 25% with pulling  Left rotation Limited 25% with pulling   (Blank rows = not tested)  LOWER EXTREMITY ROM:     Active  Right eval Left eval  Hip flexion    Hip extension    Hip abduction    Hip adduction    Hip internal rotation    Hip external rotation    Knee flexion    Knee extension    Ankle dorsiflexion    Ankle plantarflexion    Ankle inversion    Ankle eversion     (Blank rows = not tested)  LOWER EXTREMITY MMT:    MMT Right eval Left eval  Hip flexion 28.1 27.1  Hip extension    Hip abduction 22.1 30.1  Hip adduction    Hip internal rotation    Hip external rotation    Knee flexion    Knee extension 18.1 24.5  Ankle dorsiflexion    Ankle plantarflexion    Ankle inversion    Ankle eversion     (Blank rows = not tested)   FUNCTIONAL TESTS:  Increased pain transferring from sit to stand  GAIT:  Significant lateral movement.  Decreased single-leg stance time on right   TODAY'S TREATMENT  11/13: DKTC with physioball 1.64mins (had pain in low back from  supine position, so stopped) Seated HSS 2x20sec ea Sidelying clams x20ea Seated march with TrA 2x10 LAQ 5" hold 2x10ea Adductor sqz with TrA seated 5" 2x10 Sit to stands from elevated plinth x10 Side stepping at rail with RTB at ankles HSC RTB  2x10ea PNF chops standing 2x10ea Seated lumbar flexion stretch 10sec x5   PATIENT EDUCATION:  Education details: HEP, symptom management, progression of activities, importance of cardiovascular activity. Person educated: Patient Education method: Explanation, Demonstration, Tactile cues, Verbal cues, and Handouts Education comprehension: verbalized understanding, returned demonstration, verbal cues required, tactile cues required, and needs further education  HOME EXERCISE PROGRAM: Access Code: [email or web address] URL: [email or web address] Date: 05/14/2023 Prepared by: Riki Altes  Exercises - Seated Flexion Stretch  - 2 x daily - 7 x weekly - 5 sets - 10-15 seconds hold - Standing 3-Way Leg Reach with Resistance at Ankles and Counter Support  - 2 x daily - 7 x weekly - 2 sets - 10 reps - Clamshell  - 2 x daily - 7 x weekly - 3 sets - 10 reps - Seated March  - 1 x daily - 7 x weekly - 3 sets - 10 reps - Seated Long Arc Quad  - 1 x daily - 7 x weekly - 3 sets - 10 reps - 5 seconds hold  ASSESSMENT:  CLINICAL IMPRESSION: Pt unable to tolerate supine position due to pain from pressure on central lumbar spine and sacrum. She has good tolerance for sideling positions; however, and was able to complete s/l clams without complaint of pain. Some muscular fatigue noted with this. She was able to complete sit to stands from elevated surface, but did have some L knee discomfort with this. Observed bilateral knee hyperextension with lateral resisted stepping at railing. Provided pt with HEP to begin at home.    Patient is a 42 year old with chronic history of back pain and bilateral knee pain.  Over the past few years her pain is decreased her ability to ambulate.  She lives on the second floor and has to go up the stairs sideways secondary to knee pain.  She has bilateral lower extremity weakness right greater than left.  She has increased  muscle tension and range lumbar rotation she has  increased pain lumbar extension.  She has tenderness to palpation in lower lumbar paraspinals.  She would benefit from skilled therapy quad exercising to increase general strength and stability in her lumbar spine.   OBJECTIVE IMPAIRMENTS: Abnormal gait, decreased activity tolerance, decreased endurance, decreased mobility, difficulty walking, decreased ROM, decreased strength, postural dysfunction, obesity, and pain.   ACTIVITY LIMITATIONS: carrying, lifting, bending, sitting, standing, squatting, stairs, transfers, and locomotion level  PARTICIPATION LIMITATIONS: meal prep, cleaning, laundry, driving, shopping, and community activity  PERSONAL FACTORS: 3+ comorbidities: multi joint OA, DMII Migraines  are also affecting patient's functional outcome.   REHAB POTENTIAL: Excellent  CLINICAL DECISION MAKING: Evolving/moderate complexity  EVALUATION COMPLEXITY: Moderate   GOALS: Goals reviewed with patient? Yes  SHORT TERM GOALS: Target date: 06/05/2023  Patient will increase gross bilateral LE strength by 5 lbs  Baseline: Goal status: INITIAL  2.  Patient wil be independent with basic HEP for land and aquatics  Baseline:  Goal status: INITIAL  3.  Patient will report a 25% reduction in pain in her knees and back  Baseline:  Goal status: INITIAL   LONG TERM GOALS: Target date: 07/03/2023    Patient will have a complete program that includes cardiovascular activity and an aquatic program  Baseline:  Goal status: INITIAL  2.  Patient will go up and down 8 steps with reciprocal gait pattern with less than 2 out of 10 pain Baseline:  Goal status: INITIAL  3.  Patient will walk community distances for exercise without increased pain Baseline:  Goal status: INITIAL  PLAN:  PT FREQUENCY: 2x/week  PT DURATION: 8 weeks  PLANNED INTERVENTIONS: Therapeutic exercises, Therapeutic activity, Neuromuscular re-education, Balance training, Gait training, Patient/Family education,  Self Care, Joint mobilization, Stair training, DME instructions, Aquatic Therapy, Dry Needling, Electrical stimulation, Cryotherapy, Moist heat, Taping, Manual therapy, and Re-evaluation. Marland Kitchen  PLAN FOR NEXT SESSION:   Land: Review self soft tissue mobilization with T-cane and tennis ball; consider seated flexion stretch. Consider supine quad strengthening if she can tolerate supine. If not consider sitting series. No HEP given 2nd to patient being late.   Aquatic: work on weight bearing and gait tolerance; Hip strengthening and core stability.   Riki Altes, PTA  05/14/23 3:12 PM [facility] [address] [place], [place], [address] Phone: [phone]   Fax:  [phone]   Referring diagnosis?  Diagnosis  M54.50,G89.29 (ICD-10-CM) - Chronic midline low back pain without sciatica   Treatment diagnosis? (if different than referring diagnosis)  Diagnosis  M54.50,G89.29 (ICD-10-CM) - Chronic midline low back pain without sciatica   What was this (referring dx) caused by? []  Surgery []  Fall [x]  Ongoing issue [x]  Arthritis []  Other: ____________  Laterality: []  Rt []  Lt [x]  Both  Check all possible CPT codes:  *CHOOSE 10 OR LESS*   Check all possible CPT codes:  *CHOOSE 10 OR LESS*    Check all possible CPT codes:            *CHOOSE 10 OR LESS*                          []  97110 (Therapeutic Exercise)            []  92507 (SLP Treatment)  []  97112 (Neuro Re-ed)                         []  92526 (Swallowing Treatment)             []  97116 (Gait Training)                           []  [id number] (Cognitive Training, 1st 15 minutes) []  97140 (Manual Therapy)                               []  97130 (Cognitive Training, each add'l 15 minutes)  []  40102 (Re-evaluation)                              []  Other, List CPT Code ____________  []  97530 (Therapeutic Activities)                                             []  97535 (Self Care)                     [x]  All codes above (97110 - 97535)           []   16109 (Mechanical Traction)           [x]  97014 (E-stim Unattended)           []  97032 (E-stim manual)           [x]  97033 (Ionto)           [x]  97035 (Ultrasound) []  60454 (Physical Performance Training) []  [id number] (Aquatic Therapy) []  97016 (Vasopneumatic Device) []  [id number] (Paraffin) []  97034 (Contrast Bath) []  97597 (Wound Care 1st 20 sq cm) []  97598 (Wound Care each add'l 20 sq cm) []  97760 (Orthotic Fabrication, Fitting, Training Initial) []  [id number] (Prosthetic Management and Training Initial) []  [id number] (Orthotic or Prosthetic Training/ Modification Subsequent)    See Planned Interventions listed in the Plan section of the Evaluation.

## 2023-05-15 ENCOUNTER — Encounter: Payer: Self-pay | Admitting: Nurse Practitioner

## 2023-05-15 ENCOUNTER — Ambulatory Visit (INDEPENDENT_AMBULATORY_CARE_PROVIDER_SITE_OTHER): Payer: Medicare HMO | Admitting: Nurse Practitioner

## 2023-05-15 VITALS — BP 124/80 | HR 86 | Wt 364.2 lb

## 2023-05-15 DIAGNOSIS — M25562 Pain in left knee: Secondary | ICD-10-CM

## 2023-05-15 DIAGNOSIS — M25561 Pain in right knee: Secondary | ICD-10-CM

## 2023-05-15 DIAGNOSIS — G8929 Other chronic pain: Secondary | ICD-10-CM | POA: Diagnosis not present

## 2023-05-15 DIAGNOSIS — E11620 Type 2 diabetes mellitus with diabetic dermatitis: Secondary | ICD-10-CM

## 2023-05-15 DIAGNOSIS — Z6841 Body Mass Index (BMI) 40.0 and over, adult: Secondary | ICD-10-CM | POA: Diagnosis not present

## 2023-05-15 DIAGNOSIS — Z1159 Encounter for screening for other viral diseases: Secondary | ICD-10-CM

## 2023-05-15 DIAGNOSIS — H9193 Unspecified hearing loss, bilateral: Secondary | ICD-10-CM | POA: Diagnosis not present

## 2023-05-15 DIAGNOSIS — J309 Allergic rhinitis, unspecified: Secondary | ICD-10-CM

## 2023-05-15 DIAGNOSIS — E66813 Obesity, class 3: Secondary | ICD-10-CM

## 2023-05-15 DIAGNOSIS — M545 Low back pain, unspecified: Secondary | ICD-10-CM

## 2023-05-15 DIAGNOSIS — J4530 Mild persistent asthma, uncomplicated: Secondary | ICD-10-CM | POA: Diagnosis not present

## 2023-05-15 DIAGNOSIS — H539 Unspecified visual disturbance: Secondary | ICD-10-CM

## 2023-05-15 DIAGNOSIS — Z794 Long term (current) use of insulin: Secondary | ICD-10-CM | POA: Diagnosis not present

## 2023-05-15 DIAGNOSIS — M1990 Unspecified osteoarthritis, unspecified site: Secondary | ICD-10-CM

## 2023-05-15 MED ORDER — KETOROLAC TROMETHAMINE 60 MG/2ML IM SOLN
60.0000 mg | Freq: Once | INTRAMUSCULAR | Status: AC
Start: 2023-05-15 — End: 2023-05-15
  Administered 2023-05-15: 60 mg via INTRAMUSCULAR

## 2023-05-15 MED ORDER — METHYLPREDNISOLONE SODIUM SUCC 125 MG IJ SOLR
125.0000 mg | Freq: Once | INTRAMUSCULAR | Status: AC
Start: 2023-05-15 — End: 2023-05-15
  Administered 2023-05-15: 62.5 mg via INTRAMUSCULAR

## 2023-05-15 NOTE — Progress Notes (Signed)
Monica Clamp, DNP, AGNP-c [facility]  [address] [place], [place] [address] [phone]  ESTABLISHED PATIENT- Chronic Health and/or Follow-Up Visit  Blood pressure 124/80, pulse 86, weight (!) 364 lb 3.2 oz (165.2 kg), last menstrual period 03/18/2023.    Monica Hunter is a 42 y.o. year old female presenting today for evaluation and management of chronic conditions.   History of Present Illness Monica Hunter is currently undergoing physical therapy, which includes water and land exercises, aimed at alleviating the pain in her back and knees. However, the therapy is causing some discomfort, which she hopes will improve with continued exercise. She reports having arthritis in her knees, which she describes as painful. She is aware that the severity of arthritis can vary from day to day, and she is currently managing her symptoms with physical therapy.  The patient also reports experiencing pain in the buttocks area, which was previously managed with cortisone shots. She expresses a desire for another shot, stating that it provides relief for about a week or two.  Regarding her diabetes management, the patient is on a regimen of Mounjaro once a week and Lantus daily. She has noticed a decrease in her blood sugar levels, with an average reading of 130. However, she has observed fluctuations in her blood sugar levels, particularly during the night, and is considering adjusting her Lantus dosage accordingly.  The patient also mentions experiencing vision problems, including seeing spots and blurriness, particularly in the late afternoons. She expresses a need for an eye check-up, as she believes her vision has worsened over the past four years, possibly due to episodes of high blood sugar.  All ROS negative with exception of what is listed above.   PHYSICAL EXAM Physical Exam Vitals and nursing note reviewed.  Constitutional:      Appearance: Normal appearance.  HENT:      Head: Normocephalic.  Eyes:     Pupils: Pupils are equal, round, and reactive to light.  Neck:     Vascular: No carotid bruit.  Cardiovascular:     Rate and Rhythm: Normal rate and regular rhythm.     Pulses: Normal pulses.     Heart sounds: Normal heart sounds.  Pulmonary:     Effort: Pulmonary effort is normal.     Breath sounds: Wheezing present.  Musculoskeletal:        General: Tenderness present. Normal range of motion.     Cervical back: Normal range of motion.     Right lower leg: No edema.  Skin:    General: Skin is warm.  Neurological:     General: No focal deficit present.     Mental Status: She is alert and oriented to person, place, and time.  Psychiatric:        Mood and Affect: Mood normal.      PLAN Problem List Items Addressed This Visit     Type 2 diabetes mellitus with diabetic dermatitis, with long-term current use of insulin (HCC) - Primary    Well-controlled Type 2 diabetes (average blood sugar 130, HbA1c 6.4). On Mounjaro weekly and Lantus 24 units daily. Experiencing Somogyi effect with nocturnal hypoglycemia and subsequent hyperglycemia. Discussed tapering Lantus to prevent nocturnal hypoglycemia and potential discontinuation if blood sugar remains stable. - Taper Lantus by 2 units per day if morning blood sugar is less than 120 - Monitor blood sugar levels and adjust Lantus dosage accordingly - Ensure protein snack before bed to prevent nocturnal hypoglycemia       Relevant  Orders   Ambulatory referral to Ophthalmology   Hemoglobin A1c (Completed)   Comprehensive metabolic panel (Completed)   Microalbumin / creatinine urine ratio   Chronic bilateral low back pain without sciatica    Chronic hip and back pain managed with physical therapy. Previous cortisone injections provided temporary relief. Cortisone injections in the hip not performed here. Discussed benefits and risks of steroid and Toradol injections, including temporary pain relief and  potential increase in blood sugar levels. - Administer steroid and Toradol injections - Continue physical therapy      Vision changes    Worsening vision and seeing spots, especially in the afternoons. Last eye check-up over four years ago. Emphasized importance of annual eye examinations due to diabetes. - Refer to ophthalmologist for eye examination      Relevant Orders   Ambulatory referral to Ophthalmology   Decreased hearing of both ears   Relevant Orders   Ambulatory referral to Audiology   Osteoarthritis    Chronic knee and back pain with variable severity, managed with physical therapy. - Continue current management and physical therapy      Allergic rhinitis    Chronic nasal congestion with mucus buildup. Previous effective treatment with Dymista, but insurance denies coverage. Discussed trying alternative nasal sprays from pharmacy. - Attempt to obtain alternative nasal sprays from pharmacy - Inform provider if unable to obtain medication       Bilateral chronic knee pain   Class 3 severe obesity due to excess calories without serious comorbidity with body mass index (BMI) of 50.0 to 59.9 in adult [facility])   Relevant Orders   Hemoglobin A1c (Completed)   Comprehensive metabolic panel (Completed)   Asthma   Other Visit Diagnoses     Encounter for hepatitis C screening test for low risk patient       Relevant Orders   Hepatitis C antibody (Completed)       Return in about 6 months (around 11/12/2023) for Med Management 30- or CPE, whatever she needs first.  Monica Clamp, DNP, AGNP-c

## 2023-05-15 NOTE — Patient Instructions (Addendum)
I sent a referral to the eye doctor for you so they can look into your eyes and see what needs to be changed.   I sent a referral for hearing evaluation.   I am SO proud of you for getting your blood sugars under control!!!  You can lower your lantus as we discussed for waking fasting blood sugars that are less than 120.  Insulin Glargine Injection What is this medication? INSULIN GLARGINE (IN su lin GLAR geen) treats diabetes. It works by increasing insulin levels in your body, which decreases your blood sugar (glucose). It belongs to a group of medications called long-acting insulins or basal insulins. Changes to diet and exercise are often combined with this medication. This medicine may be used for other purposes; ask your health care provider or pharmacist if you have questions. COMMON BRAND NAME(S): BASAGLAR, [employer], Basaglar Tempo, Lantus, Lantus SoloStar, REZVOGLAR, Semglee, Toujeo Max SoloStar, [employer] What should I tell my care team before I take this medication? They need to know if you have any of these conditions: Episodes of low blood sugar Eye disease, vision problems Kidney disease Liver disease An unusual or allergic reaction to insulin, metacresol, other medications, foods, dyes, or preservatives Pregnant or trying to get pregnant Breast-feeding How should I use this medication? This medication is injected under the skin. Y Use it exactly as directed. It is important to follow the directions given to you by your care team. This insulin should never be mixed in the same syringe with other insulins before injection. You will be taught how to use this medication and how to adjust doses for activities and illness. Do not use more insulin than prescribed. Do not use it more or less often than prescribed. Always check the appearance of your insulin before using it. This insulin should be clear and colorless like water. Do not use it if it is cloudy, thickened,  colored, or has solid particles in it. If you use a pen, be sure to take off the outer needle cover before using the dose. It is important that you put your used needles and syringes in a special sharps container. Do not put them in a trash can. If you do not have a sharps container, call your pharmacist or care team to get one. This medication comes with INSTRUCTIONS FOR USE. Ask your pharmacist for directions on how to use this medication. Read the information carefully. Talk to your pharmacist or care team if you have questions. Talk to your care team about the use of this medication in children. While it may be prescribed for children as young as 6 years for selected conditions, precautions do apply. Overdosage: If you think you have taken too much of this medicine contact a poison control center or emergency room at once. NOTE: This medicine is only for you. Do not share this medicine with others. What if I miss a dose? It is important not to miss a dose. Your care team should discuss a plan for missed doses with you. If you do miss a dose, follow their plan. Do not take double doses. What may interact with this medication? Some medications may affect your blood sugar levels or hide the symptoms of low blood sugar (hypoglycemia). Talk with your care team about all of the medications you take. They may suggest changes to your insulin dose or checking your blood sugar levels more often. Medications that may affect your blood sugar levels include: Alcohol Certain antibiotics, such as  ciprofloxacin, levofloxacin, sulfamethoxazole; trimethoprim Certain medications for blood pressure or heart disease, such as benazepril, enalapril, lisinopril, losartan, valsartan Certain medications for mental health conditions, such as fluoxetine or olanzapine Diuretics, such as hydrochlorothiazide (HCTZ) Estrogen and progestin hormones Other medications for diabetes Steroid medications, such as prednisone or  cortisone Testosterone Thyroid hormones Medications that may mask symptoms of low blood sugar include: Beta blockers, such as atenolol, metoprolol, propranolol Clonidine Guanethidine Reserpine This list may not describe all possible interactions. Give your health care provider a list of all the medicines, herbs, non-prescription drugs, or dietary supplements you use. Also tell them if you smoke, drink alcohol, or use illegal drugs. Some items may interact with your medicine. What should I watch for while using this medication? Visit your care team for regular checks on your progress. Your care team will monitor your hemoglobin A1C. This is a simple blood test. It measures your average blood sugar level over the past 3 months. It will help you and your care team manage your diabetes. Learn how to check your blood sugar levels. Know the symptoms of low and high blood sugar and how to manage them. Always carry a source of quick sugar with you for symptoms of low blood sugar. Examples include glucose tablets, juice, or sugar candy. Teach your family members, friends, and others how to help you if your blood sugar is too low and you are not awake enough to treat it. Talk to your care team if you have high blood sugar. You may need to adjust your insulin dose. Many factors can cause high blood sugar, including illness, stress, or a change in activity. Do not skip meals. Ask your care team if you should avoid alcohol. Many cough and cold products contain sugar or alcohol. These can affect blood sugar levels. Make sure that you have the correct syringe for the type of insulin you use. Do not change the brand or type of insulin or syringe unless your care team tells you to. Switching insulin brand or type can affect your blood sugar enough to cause serious adverse effects. Always keep an extra supply of insulin and related supplies on hand. Only use syringes once. Get rid of syringes and needles in a closed  container to prevent accidental needle sticks. Do not share insulin pens or cartridges with anyone, even if the needle is changed. Each pen should only be used by one person. Sharing could cause an infection. Do not use a syringe to take insulin out of an insulin pen. Doing this may result in the wrong dose of insulin. Wear a medical ID bracelet or chain. Carry a card that describes your condition. List the medications and doses you take on the card. What side effects may I notice from receiving this medication? Side effects that you should report to your care team as soon as possible: Allergic reactions--skin rash, itching, hives, swelling of the face, lips, tongue, or throat Low blood sugar (hypoglycemia)--tremors or shaking, anxiety, sweating, cold or clammy skin, confusion, dizziness, rapid heartbeat Low potassium level--muscle pain or cramps, unusual weakness or fatigue, fast or irregular heartbeat, constipation Side effects that usually do not require medical attention (report to your care team if they continue or are bothersome): Lipodystrophy--hardening or scarring of tissue at injection site Pain, redness, or irritation at injection site Weight gain This list may not describe all possible side effects. Call your doctor for medical advice about side effects. You may report side effects to FDA at 1-800-FDA-1088. Where  should I keep my medication? Keep out of the reach of children and pets. Storage and expiration dates for different insulin products may vary. Check the label for information on how to store your insulin. Talk to your care team if you have any questions. Do not freeze. Protect from direct light and heat. Do not use insulin if it is exposed to temperatures above 37 degrees C (98.6 degrees F). Do not use insulin if it has been frozen. Multi-dose vials Unopened (not in-use): Store at room temperature up to 30 degrees C (86 degrees F) for up to 28 days, or refrigerated until the  expiration date. Opened (in-use): Store at room temperature or refrigerated for up to 28 days. Prefilled pens Unopened (not in-use): Store at room temperature up to 30 degrees C (86 degrees F) for up to 28 days, or refrigerated until the expiration date. Opened (in-use): Store at room temperature for up to 28 days. Do not refrigerate. To get rid of medications that are no longer needed or have expired: Take the medication to a medication take-back program. Check with your pharmacy or law enforcement to find a location. If you cannot return the medication, ask your pharmacist or care team how to get rid of this medication safely. NOTE: This sheet is a summary. It may not cover all possible information. If you have questions about this medicine, talk to your doctor, pharmacist, or health care provider.  2024 Elsevier/Gold Standard (2022-04-15 00:00:00)

## 2023-05-16 LAB — COMPREHENSIVE METABOLIC PANEL
ALT: 19 [IU]/L (ref 0–32)
AST: 18 [IU]/L (ref 0–40)
Albumin: 4 g/dL (ref 3.9–4.9)
Alkaline Phosphatase: 127 [IU]/L — ABNORMAL HIGH (ref 44–121)
BUN/Creatinine Ratio: 18 (ref 9–23)
BUN: 9 mg/dL (ref 6–24)
Bilirubin Total: 0.2 mg/dL (ref 0.0–1.2)
CO2: 22 mmol/L (ref 20–29)
Calcium: 9.6 mg/dL (ref 8.7–10.2)
Chloride: 103 mmol/L (ref 96–106)
Creatinine, Ser: 0.5 mg/dL — ABNORMAL LOW (ref 0.57–1.00)
Globulin, Total: 3.4 g/dL (ref 1.5–4.5)
Glucose: 104 mg/dL — ABNORMAL HIGH (ref 70–99)
Potassium: 4.2 mmol/L (ref 3.5–5.2)
Sodium: 139 mmol/L (ref 134–144)
Total Protein: 7.4 g/dL (ref 6.0–8.5)
eGFR: 122 mL/min/{1.73_m2} (ref >59)

## 2023-05-16 LAB — HEMOGLOBIN A1C
Est. average glucose Bld gHb Est-mCnc: 220 mg/dL
Hgb A1c MFr Bld: 9.3 % — ABNORMAL HIGH (ref 4.8–5.6)

## 2023-05-16 LAB — HEPATITIS C ANTIBODY: Hep C Virus Ab: NONREACTIVE

## 2023-05-21 ENCOUNTER — Other Ambulatory Visit (HOSPITAL_COMMUNITY): Payer: Self-pay

## 2023-05-21 ENCOUNTER — Ambulatory Visit (HOSPITAL_BASED_OUTPATIENT_CLINIC_OR_DEPARTMENT_OTHER): Payer: Medicare HMO | Admitting: Physical Therapy

## 2023-05-21 ENCOUNTER — Encounter (HOSPITAL_BASED_OUTPATIENT_CLINIC_OR_DEPARTMENT_OTHER): Payer: Self-pay | Admitting: Physical Therapy

## 2023-05-21 DIAGNOSIS — M5459 Other low back pain: Secondary | ICD-10-CM | POA: Diagnosis not present

## 2023-05-21 DIAGNOSIS — M25561 Pain in right knee: Secondary | ICD-10-CM | POA: Diagnosis not present

## 2023-05-21 DIAGNOSIS — G8929 Other chronic pain: Secondary | ICD-10-CM | POA: Diagnosis not present

## 2023-05-21 DIAGNOSIS — M25562 Pain in left knee: Secondary | ICD-10-CM | POA: Diagnosis not present

## 2023-05-21 DIAGNOSIS — M6281 Muscle weakness (generalized): Secondary | ICD-10-CM

## 2023-05-21 NOTE — Therapy (Signed)
OUTPATIENT PHYSICAL THERAPY THORACOLUMBAR TREATMENT   Patient Name: Monica Hunter MRN: [id number] DOB:08-08-1980, 42 y.o., female Today's Date: 05/22/2023  END OF SESSION:  PT End of Session - 05/21/23 1521     Visit Number 4    Number of Visits 16    Date for PT Re-Evaluation 07/02/23    PT Start Time 1517    PT Stop Time 1600    PT Time Calculation (min) 43 min    Activity Tolerance Patient tolerated treatment well    Behavior During Therapy [facility] for tasks assessed/performed               Past Medical History:  Diagnosis Date   Asthma    Candidal dermatitis 11/04/2021   Cornea abrasion    Diabetes mellitus    Migraines    Morbid obesity (HCC)    Past Surgical History:  Procedure Laterality Date   CERVICAL POLYPECTOMY     DILATION AND CURETTAGE OF UTERUS     Patient Active Problem List   Diagnosis Date Noted   Bilateral chronic knee pain 05/15/2023   Abscess 02/12/2023   Genetic testing 01/06/2023   Bilateral occipital neuralgia 08/13/2022   Chronic bilateral low back pain without sciatica 03/17/2022   Overflow incontinence of urine 12/03/2021   Type 2 diabetes mellitus with diabetic dermatitis, with long-term current use of insulin (HCC) 11/04/2021   Oligomenorrhea 11/04/2021   Gastroesophageal reflux disease 11/04/2021   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   Asthma 02/14/2018   Class 3 severe obesity due to excess calories without serious comorbidity with body mass index (BMI) of 50.0 to 59.9 in adult (HCC) 02/14/2018   Hidradenitis suppurativa 02/14/2018    PCP: Les Pou Early NP   REFERRING PROVIDER: Estill Bamberg Leftwitch   REFERRING DIAG:  Diagnosis  M54.50,G89.29 (ICD-10-CM) - Chronic midline low back pain without sciatica    Rationale for Evaluation and Treatment: Rehabilitation  THERAPY DIAG:  Other low back pain  Muscle weakness (generalized)  Chronic pain of left knee  Chronic pain of right knee  ONSET DATE: Many years    SUBJECTIVE:                                                                                                                                                                                           SUBJECTIVE STATEMENT: Patient reports pain has been about the same.  She continues to have pain on her left side going down the left leg.  She reports that it has been a significant increase in pain.  She has been able to complete some of her exercises.  She reported soreness after the  pool.  It sounds more like muscle soreness.  She reports the lasted about 2 days.  FROM INITIAL EVAL: Patient has a long history of low back pain.  She reports she has had lower back pain since she was a teenager.  Over the past few years she has developed significant bilateral knee pain as well.  Per patient her her general mobility is decreased.  She enjoyed walking for exercise prior but she is unable to now at this time.  She lives on the second floor and has to go up the stairs sideways.  She feels like when she is walking downstairs her knees may buckle.  PERTINENT HISTORY:  DM II, Migraines; Morbid obesity;   PAIN:  Are you having pain? Yes: NPRS scale: 7-8/10 Pain location: lower middle back  Pain description: aching  Aggravating factors: mornings  Relieving factors: lay around    PRECAUTIONS: None   RED FLAGS: None   WEIGHT BEARING RESTRICTIONS: No  FALLS:  Has patient fallen in last 6 months? Larey Seat througha deck   LIVING ENVIRONMENT:  Stairs into the house OCCUPATION:  home care   Hobbies:  Like to get out to walk   PLOF: Independent  PATIENT GOALS:  To have less pain/ To be able to walk longer  NEXT MD VISIT:  Nothing scheduled   OBJECTIVE:  Note: Objective measures were completed at Evaluation unless otherwise noted.  DIAGNOSTIC FINDINGS:  Lumbar MRI  IMPRESSION: 1. Mild lower lumbar spondylosis as described above. Mild-to-moderate bilateral neuroforaminal stenosis at  L5-S1.    PATIENT SURVEYS:  Unable to provide secondary to time  SCREENING FOR RED FLAGS: Bowel or bladder incontinence: No Spinal tumors: No Cauda equina syndrome: No Compression fracture: No Abdominal aneurysm: No  COGNITION: Overall cognitive status: Within functional limits for tasks assessed     SENSATION: Good sensation     POSTURE: rounded shoulders, forward head, and flexed trunk   PALPATION: Tenderness palpation bilateral lower lumbar paraspinals from L3-S1    LUMBAR ROM:   AROM eval  Flexion Pulling at end range  Extension Significant pain  Right lateral flexion   Left lateral flexion   Right rotation Limited 25% with pulling  Left rotation Limited 25% with pulling   (Blank rows = not tested)  LOWER EXTREMITY ROM:     Active  Right eval Left eval  Hip flexion    Hip extension    Hip abduction    Hip adduction    Hip internal rotation    Hip external rotation    Knee flexion    Knee extension    Ankle dorsiflexion    Ankle plantarflexion    Ankle inversion    Ankle eversion     (Blank rows = not tested)  LOWER EXTREMITY MMT:    MMT Right eval Left eval  Hip flexion 28.1 27.1  Hip extension    Hip abduction 22.1 30.1  Hip adduction    Hip internal rotation    Hip external rotation    Knee flexion    Knee extension 18.1 24.5  Ankle dorsiflexion    Ankle plantarflexion    Ankle inversion    Ankle eversion     (Blank rows = not tested)   FUNCTIONAL TESTS:  Increased pain transferring from sit to stand  GAIT:  Significant lateral movement.  Decreased single-leg stance time on right   TODAY'S TREATMENT 11/20  Manual:  Skilled palpation of trigger points; trigger point release; reviewe of self soft tissue  mobilization   Reviewed and updated HEP.  Seated ball flexion forward 5 x 10-second hold  lateral 5 x 10-second holds Ball press x 20  Reviewed how to do for home  Reviewed self soft tissue mobilization using Thera  cane and using tennis ball.  Patient given tennis ball for home.  Patient able to quickly find her trigger point on her left side with a tennis ball.  Bilateral ER red 2 x 12 Bilateral horizontal abduction red 2 x 12 Horizontal abduction with flexion red 2 x 12     11/13: DKTC with physioball 1.85mins (had pain in low back from supine position, so stopped) Seated HSS 2x20sec ea Sidelying clams x20ea Seated march with TrA 2x10 LAQ 5" hold 2x10ea Adductor sqz with TrA seated 5" 2x10 Sit to stands from elevated plinth x10 Side stepping at rail with RTB at ankles HSC RTB 2x10ea PNF chops standing 2x10ea Seated lumbar flexion stretch 10sec x5   PATIENT EDUCATION:  Education details: HEP, symptom management, progression of activities, importance of cardiovascular activity. Person educated: Patient Education method: Explanation, Demonstration, Tactile cues, Verbal cues, and Handouts Education comprehension: verbalized understanding, returned demonstration, verbal cues required, tactile cues required, and needs further education  HOME EXERCISE PROGRAM: Access Code: [email or web address] URL: [email or web address] Date: 05/14/2023 Prepared by: Riki Altes  Exercises - Seated Flexion Stretch  - 2 x daily - 7 x weekly - 5 sets - 10-15 seconds hold - Standing 3-Way Leg Reach with Resistance at Ankles and Counter Support  - 2 x daily - 7 x weekly - 2 sets - 10 reps - Clamshell  - 2 x daily - 7 x weekly - 3 sets - 10 reps - Seated March  - 1 x daily - 7 x weekly - 3 sets - 10 reps - Seated Long Arc Quad  - 1 x daily - 7 x weekly - 3 sets - 10 reps - 5 seconds hold  ASSESSMENT:  CLINICAL IMPRESSION: Patient tolerated treatment well.  She has significant trigger points in her left lumbar spine from L3-S1.  We worked on manual therapy to that area.  We then reviewed self trigger point release to that area.  We updated her HEP.  She is also given a forward flexion stretch to decompress  lumbar spine.  We reviewed her HEP and how to use her HEP.  We also gave her an upper extremity series of exercises to work on.  The patient now has a standing series seated series and upper extremity series.  She is advised to take 1 series and then try to 0 and on the stretches that are more beneficial for.  Overall she is tolerating exercises.  She has not had a significant improvement in pain but she has not also not had a significant increase in pain either.  Her next appointment is in the pool.  We will continue to progress as tolerated.  See below for goal specific progress.   Patient is a 42 year old with chronic history of back pain and bilateral knee pain.  Over the past few years her pain is decreased her ability to ambulate.  She lives on the second floor and has to go up the stairs sideways secondary to knee pain.  She has bilateral lower extremity weakness right greater than left.  She has increased  muscle tension and range lumbar rotation she has increased pain lumbar extension.  She has tenderness to palpation in lower lumbar paraspinals.  She would benefit from skilled therapy  quad exercising to increase general strength and stability in her lumbar spine.   OBJECTIVE IMPAIRMENTS: Abnormal gait, decreased activity tolerance, decreased endurance, decreased mobility, difficulty walking, decreased ROM, decreased strength, postural dysfunction, obesity, and pain.   ACTIVITY LIMITATIONS: carrying, lifting, bending, sitting, standing, squatting, stairs, transfers, and locomotion level  PARTICIPATION LIMITATIONS: meal prep, cleaning, laundry, driving, shopping, and community activity  PERSONAL FACTORS: 3+ comorbidities: multi joint OA, DMII Migraines  are also affecting patient's functional outcome.   REHAB POTENTIAL: Excellent  CLINICAL DECISION MAKING: Evolving/moderate complexity  EVALUATION COMPLEXITY: Moderate   GOALS: Goals reviewed with patient? Yes  SHORT TERM GOALS: Target  date: 06/05/2023  Patient will increase gross bilateral LE strength by 5 lbs  Baseline: Goal status: Continue to build HEP for strengthening 11/21  2.  Patient wil be independent with basic HEP for land and aquatics  Baseline:  Goal status: Continue to build program 11/21  3.  Patient will report a 25% reduction in pain in her knees and back  Baseline:  Goal status: No significant reduction in this at this time 11/21   LONG TERM GOALS: Target date: 07/03/2023    Patient will have a complete program that includes cardiovascular activity and an aquatic program  Baseline:  Goal status: INITIAL  2.  Patient will go up and down 8 steps with reciprocal gait pattern with less than 2 out of 10 pain Baseline:  Goal status: INITIAL  3.  Patient will walk community distances for exercise without increased pain Baseline:  Goal status: INITIAL  PLAN:  PT FREQUENCY: 2x/week  PT DURATION: 8 weeks  PLANNED INTERVENTIONS: Therapeutic exercises, Therapeutic activity, Neuromuscular re-education, Balance training, Gait training, Patient/Family education, Self Care, Joint mobilization, Stair training, DME instructions, Aquatic Therapy, Dry Needling, Electrical stimulation, Cryotherapy, Moist heat, Taping, Manual therapy, and Re-evaluation. Marland Kitchen  PLAN FOR NEXT SESSION:   Land: Review self soft tissue mobilization with T-cane and tennis ball; consider seated flexion stretch. Consider supine quad strengthening if she can tolerate supine. If not consider sitting series. No HEP given 2nd to patient being late.   Aquatic: work on weight bearing and gait tolerance; Hip strengthening and core stability.   Lorayne Bender, PT, DPT  05/22/23 9:58 AM [facility] [address] [place], [place], [address] Phone: [phone]   Fax:  [phone]   Referring diagnosis?  Diagnosis  M54.50,G89.29 (ICD-10-CM) - Chronic midline low back pain without  sciatica   Treatment diagnosis? (if different than referring diagnosis)  Diagnosis  M54.50,G89.29 (ICD-10-CM) - Chronic midline low back pain without sciatica   What was this (referring dx) caused by? []  Surgery []  Fall [x]  Ongoing issue [x]  Arthritis []  Other: ____________  Laterality: []  Rt []  Lt [x]  Both  Check all possible CPT codes:  *CHOOSE 10 OR LESS*   Check all possible CPT codes:  *CHOOSE 10 OR LESS*    Check all possible CPT codes:            *CHOOSE 10 OR LESS*                          []  97110 (Therapeutic Exercise)            []  92507 (SLP Treatment)  []  97112 (Neuro Re-ed)                         []  25366 (Swallowing Treatment)             []   16109 (Investment banker, operational)                           []  [phone] (Cognitive Training, 1st 15 minutes) []  97140 (Manual Therapy)                               []  97130 (Cognitive Training, each add'l 15 minutes)  []  97164 (Re-evaluation)                              []  Other, List CPT Code ____________  []  97530 (Therapeutic Activities)                                             []  97535 (Self Care)                    [x]  All codes above (97110 - 97535)           []  97012 (Mechanical Traction)           [x]  97014 (E-stim Unattended)           []  97032 (E-stim manual)           [x]  97033 (Ionto)           [x]  97035 (Ultrasound) []  97750 (Physical Performance Training) []  [id number] (Aquatic Therapy) []  97016 (Vasopneumatic Device) []  [id number] (Paraffin) []  97034 (Contrast Bath) []  09811 (Wound Care 1st 20 sq cm) []  97598 (Wound Care each add'l 20 sq cm) []  97760 (Orthotic Fabrication, Fitting, Training Initial) []  [id number] (Prosthetic Management and Training Initial) []  [id number] (Orthotic or Prosthetic Training/ Modification Subsequent)    See Planned Interventions listed in the Plan section of the Evaluation.

## 2023-05-22 ENCOUNTER — Encounter (HOSPITAL_BASED_OUTPATIENT_CLINIC_OR_DEPARTMENT_OTHER): Payer: Self-pay | Admitting: Physical Therapy

## 2023-05-23 ENCOUNTER — Encounter: Payer: Self-pay | Admitting: Internal Medicine

## 2023-05-25 DIAGNOSIS — J309 Allergic rhinitis, unspecified: Secondary | ICD-10-CM | POA: Insufficient documentation

## 2023-05-25 DIAGNOSIS — M1990 Unspecified osteoarthritis, unspecified site: Secondary | ICD-10-CM | POA: Insufficient documentation

## 2023-05-25 DIAGNOSIS — H9193 Unspecified hearing loss, bilateral: Secondary | ICD-10-CM | POA: Insufficient documentation

## 2023-05-25 DIAGNOSIS — H539 Unspecified visual disturbance: Secondary | ICD-10-CM | POA: Insufficient documentation

## 2023-05-25 NOTE — Assessment & Plan Note (Signed)
Worsening vision and seeing spots, especially in the afternoons. Last eye check-up over four years ago. Emphasized importance of annual eye examinations due to diabetes. - Refer to ophthalmologist for eye examination

## 2023-05-25 NOTE — Assessment & Plan Note (Signed)
Chronic hip and back pain managed with physical therapy. Previous cortisone injections provided temporary relief. Cortisone injections in the hip not performed here. Discussed benefits and risks of steroid and Toradol injections, including temporary pain relief and potential increase in blood sugar levels. - Administer steroid and Toradol injections - Continue physical therapy

## 2023-05-25 NOTE — Assessment & Plan Note (Signed)
Chronic knee and back pain with variable severity, managed with physical therapy. - Continue current management and physical therapy

## 2023-05-25 NOTE — Assessment & Plan Note (Signed)
Well-controlled Type 2 diabetes (average blood sugar 130, HbA1c 6.4). On Mounjaro weekly and Lantus 24 units daily. Experiencing Somogyi effect with nocturnal hypoglycemia and subsequent hyperglycemia. Discussed tapering Lantus to prevent nocturnal hypoglycemia and potential discontinuation if blood sugar remains stable. - Taper Lantus by 2 units per day if morning blood sugar is less than 120 - Monitor blood sugar levels and adjust Lantus dosage accordingly - Ensure protein snack before bed to prevent nocturnal hypoglycemia

## 2023-05-25 NOTE — Assessment & Plan Note (Signed)
Chronic nasal congestion with mucus buildup. Previous effective treatment with Dymista, but insurance denies coverage. Discussed trying alternative nasal sprays from pharmacy. - Attempt to obtain alternative nasal sprays from pharmacy - Inform provider if unable to obtain medication

## 2023-05-27 ENCOUNTER — Ambulatory Visit (HOSPITAL_BASED_OUTPATIENT_CLINIC_OR_DEPARTMENT_OTHER): Payer: Medicare HMO | Admitting: Physical Therapy

## 2023-06-04 ENCOUNTER — Ambulatory Visit (HOSPITAL_BASED_OUTPATIENT_CLINIC_OR_DEPARTMENT_OTHER): Payer: Medicare HMO | Attending: Advanced Practice Midwife | Admitting: Physical Therapy

## 2023-06-04 DIAGNOSIS — M25561 Pain in right knee: Secondary | ICD-10-CM | POA: Diagnosis not present

## 2023-06-04 DIAGNOSIS — M25562 Pain in left knee: Secondary | ICD-10-CM | POA: Diagnosis not present

## 2023-06-04 DIAGNOSIS — M6281 Muscle weakness (generalized): Secondary | ICD-10-CM | POA: Insufficient documentation

## 2023-06-04 DIAGNOSIS — G8929 Other chronic pain: Secondary | ICD-10-CM | POA: Insufficient documentation

## 2023-06-04 DIAGNOSIS — M5459 Other low back pain: Secondary | ICD-10-CM | POA: Insufficient documentation

## 2023-06-04 NOTE — Therapy (Signed)
OUTPATIENT PHYSICAL THERAPY THORACOLUMBAR TREATMENT   Patient Name: Monica Hunter MRN: [id number] DOB:01-04-1981, 42 y.o., female Today's Date: 06/05/2023  END OF SESSION:  PT End of Session - 06/04/23 1557     Visit Number 5    Number of Visits 16    Date for PT Re-Evaluation 07/02/23    PT Start Time 1523   Patient 7 minutes late   PT Stop Time 1601    PT Time Calculation (min) 38 min    Activity Tolerance Patient tolerated treatment well    Behavior During Therapy [facility] for tasks assessed/performed                Past Medical History:  Diagnosis Date   Asthma    Candidal dermatitis 11/04/2021   Cornea abrasion    Diabetes mellitus    Migraines    Morbid obesity (HCC)    Past Surgical History:  Procedure Laterality Date   CERVICAL POLYPECTOMY     DILATION AND CURETTAGE OF UTERUS     Patient Active Problem List   Diagnosis Date Noted   Vision changes 05/25/2023   Decreased hearing of both ears 05/25/2023   Osteoarthritis 05/25/2023   Allergic rhinitis 05/25/2023   Bilateral chronic knee pain 05/15/2023   Abscess 02/12/2023   Genetic testing 01/06/2023   Bilateral occipital neuralgia 08/13/2022   Chronic bilateral low back pain without sciatica 03/17/2022   Overflow incontinence of urine 12/03/2021   Type 2 diabetes mellitus with diabetic dermatitis, with long-term current use of insulin (HCC) 11/04/2021   Oligomenorrhea 11/04/2021   Gastroesophageal reflux disease 11/04/2021   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   Asthma 02/14/2018   Class 3 severe obesity due to excess calories without serious comorbidity with body mass index (BMI) of 50.0 to 59.9 in adult (HCC) 02/14/2018   Hidradenitis suppurativa 02/14/2018    PCP: Les Pou Early NP   REFERRING PROVIDER: Estill Bamberg Leftwitch   REFERRING DIAG:  Diagnosis  M54.50,G89.29 (ICD-10-CM) - Chronic midline low back pain without sciatica    Rationale for Evaluation and Treatment: Rehabilitation  THERAPY  DIAG:  Other low back pain  Muscle weakness (generalized)  Chronic pain of left knee  Chronic pain of right knee  ONSET DATE: Many years   SUBJECTIVE:                                                                                                                                                                                           SUBJECTIVE STATEMENT: Patient reports pain has been about the same.  She continues to have pain on her left side going down the left leg.  She reports that it has been a significant increase in pain.  She has been able to complete some of her exercises.  She reported soreness after the pool.  It sounds more like muscle soreness.  She reports the lasted about 2 days.  FROM INITIAL EVAL: Patient has a long history of low back pain.  She reports she has had lower back pain since she was a teenager.  Over the past few years she has developed significant bilateral knee pain as well.  Per patient her her general mobility is decreased.  She enjoyed walking for exercise prior but she is unable to now at this time.  She lives on the second floor and has to go up the stairs sideways.  She feels like when she is walking downstairs her knees may buckle.  PERTINENT HISTORY:  DM II, Migraines; Morbid obesity;   PAIN:  Are you having pain? Yes: NPRS scale: 7-8/10 Pain location: lower middle back  Pain description: aching  Aggravating factors: mornings  Relieving factors: lay around    PRECAUTIONS: None   RED FLAGS: None   WEIGHT BEARING RESTRICTIONS: No  FALLS:  Has patient fallen in last 6 months? Larey Seat througha deck   LIVING ENVIRONMENT:  Stairs into the house OCCUPATION:  home care   Hobbies:  Like to get out to walk   PLOF: Independent  PATIENT GOALS:  To have less pain/ To be able to walk longer  NEXT MD VISIT:  Nothing scheduled   OBJECTIVE:  Note: Objective measures were completed at Evaluation unless otherwise noted.  DIAGNOSTIC  FINDINGS:  Lumbar MRI  IMPRESSION: 1. Mild lower lumbar spondylosis as described above. Mild-to-moderate bilateral neuroforaminal stenosis at L5-S1.    PATIENT SURVEYS:  Unable to provide secondary to time  SCREENING FOR RED FLAGS: Bowel or bladder incontinence: No Spinal tumors: No Cauda equina syndrome: No Compression fracture: No Abdominal aneurysm: No  COGNITION: Overall cognitive status: Within functional limits for tasks assessed     SENSATION: Good sensation     POSTURE: rounded shoulders, forward head, and flexed trunk   PALPATION: Tenderness palpation bilateral lower lumbar paraspinals from L3-S1    LUMBAR ROM:   AROM eval  Flexion Pulling at end range  Extension Significant pain  Right lateral flexion   Left lateral flexion   Right rotation Limited 25% with pulling  Left rotation Limited 25% with pulling   (Blank rows = not tested)  LOWER EXTREMITY ROM:     Active  Right eval Left eval  Hip flexion    Hip extension    Hip abduction    Hip adduction    Hip internal rotation    Hip external rotation    Knee flexion    Knee extension    Ankle dorsiflexion    Ankle plantarflexion    Ankle inversion    Ankle eversion     (Blank rows = not tested)  LOWER EXTREMITY MMT:    MMT Right eval Left eval  Hip flexion 28.1 27.1  Hip extension    Hip abduction 22.1 30.1  Hip adduction    Hip internal rotation    Hip external rotation    Knee flexion    Knee extension 18.1 24.5  Ankle dorsiflexion    Ankle plantarflexion    Ankle inversion    Ankle eversion     (Blank rows = not tested)   FUNCTIONAL TESTS:  Increased pain transferring from sit to stand  GAIT:  Significant lateral movement.  Decreased single-leg stance time on right   TODAY'S TREATMENT 12/4  Seated ball flexion forward 5 x 10-second hold  lateral 5 x 10-second holds Ball press x 20  Nu-step 5 min L3   Row red 2x10  Shoulder extension red  2x10   LAQ red  3x10 each leg  Hip adduction 3x10    Supine: LTRx10 each side          11/20  Manual:  Skilled palpation of trigger points; trigger point release; reviewe of self soft tissue mobilization   Reviewed and updated HEP.  Seated ball flexion forward 5 x 10-second hold  lateral 5 x 10-second holds Ball press x 20  Reviewed how to do for home  Reviewed self soft tissue mobilization using Thera cane and using tennis ball.  Patient given tennis ball for home.  Patient able to quickly find her trigger point on her left side with a tennis ball.  Bilateral ER red 2 x 12 Bilateral horizontal abduction red 2 x 12 Horizontal abduction with flexion red 2 x 12     11/13: DKTC with physioball 1.36mins (had pain in low back from supine position, so stopped) Seated HSS 2x20sec ea Sidelying clams x20ea Seated march with TrA 2x10 LAQ 5" hold 2x10ea Adductor sqz with TrA seated 5" 2x10 Sit to stands from elevated plinth x10 Side stepping at rail with RTB at ankles HSC RTB 2x10ea PNF chops standing 2x10ea Seated lumbar flexion stretch 10sec x5   PATIENT EDUCATION:  Education details: HEP, symptom management, progression of activities, importance of cardiovascular activity. Person educated: Patient Education method: Explanation, Demonstration, Tactile cues, Verbal cues, and Handouts Education comprehension: verbalized understanding, returned demonstration, verbal cues required, tactile cues required, and needs further education  HOME EXERCISE PROGRAM: Access Code: [email or web address] URL: [email or web address] Date: 05/14/2023 Prepared by: Riki Altes  Exercises - Seated Flexion Stretch  - 2 x daily - 7 x weekly - 5 sets - 10-15 seconds hold - Standing 3-Way Leg Reach with Resistance at Ankles and Counter Support  - 2 x daily - 7 x weekly - 2 sets - 10 reps - Clamshell  - 2 x daily - 7 x weekly - 3 sets - 10 reps - Seated March  - 1 x daily - 7 x weekly - 3 sets - 10  reps - Seated Long Arc Quad  - 1 x daily - 7 x weekly - 3 sets - 10 reps - 5 seconds hold  ASSESSMENT:  CLINICAL IMPRESSION: Therapy continues to expand the patients exercise program. She had no significant increase in pain. She was fatigued. She felt like her muscles were fatigued. Therapy will continue to progress as tolerated. We reviewed and expanded HEP.   Patient is a 42 year old with chronic history of back pain and bilateral knee pain.  Over the past few years her pain is decreased her ability to ambulate.  She lives on the second floor and has to go up the stairs sideways secondary to knee pain.  She has bilateral lower extremity weakness right greater than left.  She has increased  muscle tension and range lumbar rotation she has increased pain lumbar extension.  She has tenderness to palpation in lower lumbar paraspinals.  She would benefit from skilled therapy quad exercising to increase general strength and stability in her lumbar spine.   OBJECTIVE IMPAIRMENTS: Abnormal gait, decreased activity tolerance, decreased endurance, decreased mobility, difficulty walking, decreased ROM, decreased strength, postural dysfunction, obesity, and pain.   ACTIVITY LIMITATIONS: carrying,  lifting, bending, sitting, standing, squatting, stairs, transfers, and locomotion level  PARTICIPATION LIMITATIONS: meal prep, cleaning, laundry, driving, shopping, and community activity  PERSONAL FACTORS: 3+ comorbidities: multi joint OA, DMII Migraines  are also affecting patient's functional outcome.   REHAB POTENTIAL: Excellent  CLINICAL DECISION MAKING: Evolving/moderate complexity  EVALUATION COMPLEXITY: Moderate   GOALS: Goals reviewed with patient? Yes  SHORT TERM GOALS: Target date: 06/05/2023  Patient will increase gross bilateral LE strength by 5 lbs  Baseline: Goal status: Continue to build HEP for strengthening 11/21  2.  Patient wil be independent with basic HEP for land and aquatics   Baseline:  Goal status: Continue to build program 11/21  3.  Patient will report a 25% reduction in pain in her knees and back  Baseline:  Goal status: No significant reduction in this at this time 11/21   LONG TERM GOALS: Target date: 07/03/2023    Patient will have a complete program that includes cardiovascular activity and an aquatic program  Baseline:  Goal status: INITIAL  2.  Patient will go up and down 8 steps with reciprocal gait pattern with less than 2 out of 10 pain Baseline:  Goal status: INITIAL  3.  Patient will walk community distances for exercise without increased pain Baseline:  Goal status: INITIAL  PLAN:  PT FREQUENCY: 2x/week  PT DURATION: 8 weeks  PLANNED INTERVENTIONS: Therapeutic exercises, Therapeutic activity, Neuromuscular re-education, Balance training, Gait training, Patient/Family education, Self Care, Joint mobilization, Stair training, DME instructions, Aquatic Therapy, Dry Needling, Electrical stimulation, Cryotherapy, Moist heat, Taping, Manual therapy, and Re-evaluation. Marland Kitchen  PLAN FOR NEXT SESSION:   Land: Review self soft tissue mobilization with T-cane and tennis ball; consider seated flexion stretch. Consider supine quad strengthening if she can tolerate supine. If not consider sitting series. No HEP given 2nd to patient being late.   Aquatic: work on weight bearing and gait tolerance; Hip strengthening and core stability.   Lorayne Bender, PT, DPT  06/05/23 12:18 PM [facility] [address] [place], [place], [address] Phone: [phone]   Fax:  [phone]   Referring diagnosis?  Diagnosis  M54.50,G89.29 (ICD-10-CM) - Chronic midline low back pain without sciatica   Treatment diagnosis? (if different than referring diagnosis)  Diagnosis  M54.50,G89.29 (ICD-10-CM) - Chronic midline low back pain without sciatica   What was this (referring dx) caused by? []  Surgery []   Fall [x]  Ongoing issue [x]  Arthritis []  Other: ____________  Laterality: []  Rt []  Lt [x]  Both  Check all possible CPT codes:  *CHOOSE 10 OR LESS*   Check all possible CPT codes:  *CHOOSE 10 OR LESS*    Check all possible CPT codes:            *CHOOSE 10 OR LESS*                          []  97110 (Therapeutic Exercise)            []  92507 (SLP Treatment)  []  97112 (Neuro Re-ed)                         []  92526 (Swallowing Treatment)             []  16606 (Gait Training)                           []  [id number] (Cognitive Training, 1st  15 minutes) []  97140 (Manual Therapy)                               []  44010 (Cognitive Training, each add'l 15 minutes)  []  97164 (Re-evaluation)                              []  Other, List CPT Code ____________  []  97530 (Therapeutic Activities)                                             []  97535 (Self Care)                    [x]  All codes above (97110 - 97535)           []  97012 (Mechanical Traction)           [x]  97014 (E-stim Unattended)           []  97032 (E-stim manual)           [x]  97033 (Ionto)           [x]  97035 (Ultrasound) []  97750 (Physical Performance Training) []  [id number] (Aquatic Therapy) []  97016 (Vasopneumatic Device) []  [id number] (Paraffin) []  97034 (Contrast Bath) []  97597 (Wound Care 1st 20 sq cm) []  97598 (Wound Care each add'l 20 sq cm) []  97760 (Orthotic Fabrication, Fitting, Training Initial) []  [id number] (Prosthetic Management and Training Initial) []  [id number] (Orthotic or Prosthetic Training/ Modification Subsequent)    See Planned Interventions listed in the Plan section of the Evaluation.

## 2023-06-05 ENCOUNTER — Encounter (HOSPITAL_BASED_OUTPATIENT_CLINIC_OR_DEPARTMENT_OTHER): Payer: Self-pay | Admitting: Physical Therapy

## 2023-06-11 ENCOUNTER — Ambulatory Visit (HOSPITAL_BASED_OUTPATIENT_CLINIC_OR_DEPARTMENT_OTHER): Payer: Medicare HMO | Admitting: Physical Therapy

## 2023-06-18 ENCOUNTER — Ambulatory Visit (HOSPITAL_BASED_OUTPATIENT_CLINIC_OR_DEPARTMENT_OTHER): Payer: Medicare HMO | Admitting: Physical Therapy

## 2023-06-18 ENCOUNTER — Encounter (HOSPITAL_BASED_OUTPATIENT_CLINIC_OR_DEPARTMENT_OTHER): Payer: Self-pay | Admitting: Physical Therapy

## 2023-06-18 DIAGNOSIS — M6281 Muscle weakness (generalized): Secondary | ICD-10-CM

## 2023-06-18 DIAGNOSIS — M5459 Other low back pain: Secondary | ICD-10-CM | POA: Diagnosis not present

## 2023-06-18 DIAGNOSIS — G8929 Other chronic pain: Secondary | ICD-10-CM | POA: Diagnosis not present

## 2023-06-18 DIAGNOSIS — M25561 Pain in right knee: Secondary | ICD-10-CM | POA: Diagnosis not present

## 2023-06-18 DIAGNOSIS — M25562 Pain in left knee: Secondary | ICD-10-CM | POA: Diagnosis not present

## 2023-06-18 NOTE — Therapy (Signed)
OUTPATIENT PHYSICAL THERAPY THORACOLUMBAR TREATMENT   Patient Name: Monica Hunter MRN: [id number] DOB:[birth year], 42 y.o., female Today's Date: 06/18/2023  END OF SESSION:  PT End of Session - 06/18/23 1523     Visit Number 6    Number of Visits 16    Date for PT Re-Evaluation 07/02/23    Authorization Type 07/03/2022    PT Start Time 1521    PT Stop Time 1553   Patient requested to end early   PT Time Calculation (min) 32 min    Activity Tolerance Patient tolerated treatment well    Behavior During Therapy [place] for tasks assessed/performed                Past Medical History:  Diagnosis Date   Asthma    Candidal dermatitis 11/04/2021   Cornea abrasion    Diabetes mellitus    Migraines    Morbid obesity (HCC)    Past Surgical History:  Procedure Laterality Date   CERVICAL POLYPECTOMY     DILATION AND CURETTAGE OF UTERUS     Patient Active Problem List   Diagnosis Date Noted   Vision changes 05/25/2023   Decreased hearing of both ears 05/25/2023   Osteoarthritis 05/25/2023   Allergic rhinitis 05/25/2023   Bilateral chronic knee pain 05/15/2023   Abscess 02/12/2023   Genetic testing 01/06/2023   Bilateral occipital neuralgia 08/13/2022   Chronic bilateral low back pain without sciatica 03/17/2022   Overflow incontinence of urine 12/03/2021   Type 2 diabetes mellitus with diabetic dermatitis, with long-term current use of insulin (HCC) 11/04/2021   Oligomenorrhea 11/04/2021   Gastroesophageal reflux disease 11/04/2021   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   Asthma 02/14/2018   Class 3 severe obesity due to excess calories without serious comorbidity with body mass index (BMI) of 50.0 to 59.9 in adult (HCC) 02/14/2018   Hidradenitis suppurativa 02/14/2018    PCP: Les Pou Early NP   REFERRING PROVIDER: Estill Bamberg Leftwitch   REFERRING DIAG:  Diagnosis  M54.50,G89.29 (ICD-10-CM) - Chronic midline low back pain without sciatica    Rationale for Evaluation  and Treatment: Rehabilitation  THERAPY DIAG:  Other low back pain  Muscle weakness (generalized)  Chronic pain of left knee  ONSET DATE: Many years   SUBJECTIVE:                                                                                                                                                                                           SUBJECTIVE STATEMENT: The patient reports significant pain into bilateral LE.  She reports the pains from the knees down.She had difficulty walking yesterday. She doesn't  remember anything causing the pain.     FROM INITIAL EVAL: Patient has a long history of low back pain.  She reports she has had lower back pain since she was a teenager.  Over the past few years she has developed significant bilateral knee pain as well.  Per patient her her general mobility is decreased.  She enjoyed walking for exercise prior but she is unable to now at this time.  She lives on the second floor and has to go up the stairs sideways.  She feels like when she is walking downstairs her knees may buckle.  PERTINENT HISTORY:  DM II, Migraines; Morbid obesity;   PAIN:  Are you having pain? Yes: NPRS scale: 7-8/10 Pain location: lower middle back  Pain description: aching  Aggravating factors: mornings  Relieving factors: lay around    PRECAUTIONS: None   RED FLAGS: None   WEIGHT BEARING RESTRICTIONS: No  FALLS:  Has patient fallen in last 6 months? Larey Seat througha deck   LIVING ENVIRONMENT:  Stairs into the house OCCUPATION:  home care   Hobbies:  Like to get out to walk   PLOF: Independent  PATIENT GOALS:  To have less pain/ To be able to walk longer  NEXT MD VISIT:  Nothing scheduled   OBJECTIVE:  Note: Objective measures were completed at Evaluation unless otherwise noted.  DIAGNOSTIC FINDINGS:  Lumbar MRI  IMPRESSION: 1. Mild lower lumbar spondylosis as described above. Mild-to-moderate bilateral neuroforaminal stenosis at L5-S1.     PATIENT SURVEYS:  Unable to provide secondary to time  SCREENING FOR RED FLAGS: Bowel or bladder incontinence: No Spinal tumors: No Cauda equina syndrome: No Compression fracture: No Abdominal aneurysm: No  COGNITION: Overall cognitive status: Within functional limits for tasks assessed     SENSATION: Good sensation     POSTURE: rounded shoulders, forward head, and flexed trunk   PALPATION: Tenderness palpation bilateral lower lumbar paraspinals from L3-S1    LUMBAR ROM:   AROM eval  Flexion Pulling at end range  Extension Significant pain  Right lateral flexion   Left lateral flexion   Right rotation Limited 25% with pulling  Left rotation Limited 25% with pulling   (Blank rows = not tested)  LOWER EXTREMITY ROM:     Active  Right eval Left eval  Hip flexion    Hip extension    Hip abduction    Hip adduction    Hip internal rotation    Hip external rotation    Knee flexion    Knee extension    Ankle dorsiflexion    Ankle plantarflexion    Ankle inversion    Ankle eversion     (Blank rows = not tested)  LOWER EXTREMITY MMT:    MMT Right eval Left eval  Hip flexion 28.1 27.1  Hip extension    Hip abduction 22.1 30.1  Hip adduction    Hip internal rotation    Hip external rotation    Knee flexion    Knee extension 18.1 24.5  Ankle dorsiflexion    Ankle plantarflexion    Ankle inversion    Ankle eversion     (Blank rows = not tested)   FUNCTIONAL TESTS:  Increased pain transferring from sit to stand  GAIT:  Significant lateral movement.  Decreased single-leg stance time on right   TODAY'S TREATMENT 12/18 Bilateral punch 2lbs 3x10  Bicpes curl 3lbs 3x10  Shoulder flexion 3x10   Hip abduction red 3x10  Hip adduction 3x10 red  Heel raise 3x10  March 2x10   At this time the patient requested the session end early 2nd to pain     12/4  Seated ball flexion forward 5 x 10-second hold  lateral 5 x 10-second holds Ball  press x 20  Nu-step 5 min L3   Row red 2x10  Shoulder extension red  2x10   LAQ red 3x10 each leg  Hip adduction 3x10    Supine: LTRx10 each side          11/20  Manual:  Skilled palpation of trigger points; trigger point release; reviewe of self soft tissue mobilization   Reviewed and updated HEP.  Seated ball flexion forward 5 x 10-second hold  lateral 5 x 10-second holds Ball press x 20  Reviewed how to do for home  Reviewed self soft tissue mobilization using Thera cane and using tennis ball.  Patient given tennis ball for home.  Patient able to quickly find her trigger point on her left side with a tennis ball.  Bilateral ER red 2 x 12 Bilateral horizontal abduction red 2 x 12 Horizontal abduction with flexion red 2 x 12     11/13: DKTC with physioball 1.57mins (had pain in low back from supine position, so stopped) Seated HSS 2x20sec ea Sidelying clams x20ea Seated march with TrA 2x10 LAQ 5" hold 2x10ea Adductor sqz with TrA seated 5" 2x10 Sit to stands from elevated plinth x10 Side stepping at rail with RTB at ankles HSC RTB 2x10ea PNF chops standing 2x10ea Seated lumbar flexion stretch 10sec x5   PATIENT EDUCATION:  Education details: HEP, symptom management, progression of activities, importance of cardiovascular activity. Person educated: Patient Education method: Explanation, Demonstration, Tactile cues, Verbal cues, and Handouts Education comprehension: verbalized understanding, returned demonstration, verbal cues required, tactile cues required, and needs further education  HOME EXERCISE PROGRAM: Access Code: [email or web address] URL: [email or web address] Date: 05/14/2023 Prepared by: Riki Altes  Exercises - Seated Flexion Stretch  - 2 x daily - 7 x weekly - 5 sets - 10-15 seconds hold - Standing 3-Way Leg Reach with Resistance at Ankles and Counter Support  - 2 x daily - 7 x weekly - 2 sets - 10 reps - Clamshell  - 2 x daily -  7 x weekly - 3 sets - 10 reps - Seated March  - 1 x daily - 7 x weekly - 3 sets - 10 reps - Seated Long Arc Quad  - 1 x daily - 7 x weekly - 3 sets - 10 reps - 5 seconds hold  ASSESSMENT:  CLINICAL IMPRESSION: . We reviewed an UE series and worked on a LE series. Towards the end of the LE exercises she requested to end the session early 2nd to pain in her lower extremity's/ She was advised to continue with her exercises at home if the pain allows. She was also advised to keep on walking.   Patient is a 42 year old with chronic history of back pain and bilateral knee pain.  Over the past few years her pain is decreased her ability to ambulate.  She lives on the second floor and has to go up the stairs sideways secondary to knee pain.  She has bilateral lower extremity weakness right greater than left.  She has increased  muscle tension and range lumbar rotation she has increased pain lumbar extension.  She has tenderness to palpation in lower lumbar paraspinals.  She would benefit from skilled therapy quad exercising to increase general strength and stability  in her lumbar spine.   OBJECTIVE IMPAIRMENTS: Abnormal gait, decreased activity tolerance, decreased endurance, decreased mobility, difficulty walking, decreased ROM, decreased strength, postural dysfunction, obesity, and pain.   ACTIVITY LIMITATIONS: carrying, lifting, bending, sitting, standing, squatting, stairs, transfers, and locomotion level  PARTICIPATION LIMITATIONS: meal prep, cleaning, laundry, driving, shopping, and community activity  PERSONAL FACTORS: 3+ comorbidities: multi joint OA, DMII Migraines  are also affecting patient's functional outcome.   REHAB POTENTIAL: Excellent  CLINICAL DECISION MAKING: Evolving/moderate complexity  EVALUATION COMPLEXITY: Moderate   GOALS: Goals reviewed with patient? Yes  SHORT TERM GOALS: Target date: 06/05/2023  Patient will increase gross bilateral LE strength by 5 lbs   Baseline: Goal status: Continue to build HEP for strengthening 11/21  2.  Patient wil be independent with basic HEP for land and aquatics  Baseline:  Goal status: Continue to build program 11/21  3.  Patient will report a 25% reduction in pain in her knees and back  Baseline:  Goal status: No significant reduction in this at this time 11/21   LONG TERM GOALS: Target date: 07/03/2023    Patient will have a complete program that includes cardiovascular activity and an aquatic program  Baseline:  Goal status: INITIAL  2.  Patient will go up and down 8 steps with reciprocal gait pattern with less than 2 out of 10 pain Baseline:  Goal status: INITIAL  3.  Patient will walk community distances for exercise without increased pain Baseline:  Goal status: INITIAL  PLAN:  PT FREQUENCY: 2x/week  PT DURATION: 8 weeks  PLANNED INTERVENTIONS: Therapeutic exercises, Therapeutic activity, Neuromuscular re-education, Balance training, Gait training, Patient/Family education, Self Care, Joint mobilization, Stair training, DME instructions, Aquatic Therapy, Dry Needling, Electrical stimulation, Cryotherapy, Moist heat, Taping, Manual therapy, and Re-evaluation. Marland Kitchen  PLAN FOR NEXT SESSION:   Land: Review self soft tissue mobilization with T-cane and tennis ball; consider seated flexion stretch. Consider supine quad strengthening if she can tolerate supine. If not consider sitting series. No HEP given 2nd to patient being late.   Aquatic: work on weight bearing and gait tolerance; Hip strengthening and core stability.   Lorayne Bender, PT, DPT  06/18/23 4:37 PM [facility] [address] [place], [place], [address] Phone: [phone]   Fax:  [phone]   Referring diagnosis?  Diagnosis  M54.50,G89.29 (ICD-10-CM) - Chronic midline low back pain without sciatica   Treatment diagnosis? (if different than referring diagnosis)  Diagnosis   M54.50,G89.29 (ICD-10-CM) - Chronic midline low back pain without sciatica   What was this (referring dx) caused by? []  Surgery []  Fall [x]  Ongoing issue [x]  Arthritis []  Other: ____________  Laterality: []  Rt []  Lt [x]  Both  Check all possible CPT codes:  *CHOOSE 10 OR LESS*   Check all possible CPT codes:  *CHOOSE 10 OR LESS*    Check all possible CPT codes:            *CHOOSE 10 OR LESS*                          []  10272 (Therapeutic Exercise)            []  92507 (SLP Treatment)  []  97112 (Neuro Re-ed)                         []  92526 (Swallowing Treatment)             []  53664 (  Gait Training)                           []  [id number] (Cognitive Training, 1st 15 minutes) []  97140 (Manual Therapy)                               []  97130 (Cognitive Training, each add'l 15 minutes)  []  97164 (Re-evaluation)                              []  Other, List CPT Code ____________  []  [address] (Therapeutic Activities)                                             []  97535 (Self Care)                    [x]  All codes above (97110 - 97535)           []  97012 (Mechanical Traction)           [x]  97014 (E-stim Unattended)           []  97032 (E-stim manual)           [x]  97033 (Ionto)           [x]  97035 (Ultrasound) []  97750 (Physical Performance Training) []  [id number] (Aquatic Therapy) []  97016 (Vasopneumatic Device) []  [id number] (Paraffin) []  97034 (Contrast Bath) []  97597 (Wound Care 1st 20 sq cm) []  97598 (Wound Care each add'l 20 sq cm) []  97760 (Orthotic Fabrication, Fitting, Training Initial) []  [id number] (Prosthetic Management and Training Initial) []  [phone] (Orthotic or Prosthetic Training/ Modification Subsequent)    See Planned Interventions listed in the Plan section of the Evaluation.

## 2023-06-19 ENCOUNTER — Ambulatory Visit: Payer: Medicare HMO | Attending: Nurse Practitioner | Admitting: Audiologist

## 2023-06-19 DIAGNOSIS — H9193 Unspecified hearing loss, bilateral: Secondary | ICD-10-CM | POA: Insufficient documentation

## 2023-06-19 NOTE — Procedures (Signed)
  Outpatient Audiology and [facility] [address] [place], [place]  [address] [phone]  AUDIOLOGICAL  EVALUATION  NAME: Monica Hunter     DOB:   [birth year]      MRN: [id number]                                                                                     DATE: 06/19/2023     REFERENT: Tollie Eth, NP STATUS: Outpatient DIAGNOSIS: Decreased Hearing    History: [name] was seen for an audiological evaluation due to congestion and muffled hearing. [name] feels her ears will not pop, they will start to hurt. She has muffled hearing intermittently. She is congested all year around. [name] has eustachian tube dysfunction. She is on decongestants and medications from her allergist. 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  Medical history shows diabetes which is a risk for hearing loss.   Evaluation:  Otoscopy showed a clear view of the tympanic membranes, bilaterally Tympanometry results were consistent with normal middle ear function Audiometric testing was completed using Conventional Audiometry techniques with insert earphones and supraural headphones. Test results are consistent with normal hearing sloping to mild loss at 6-8kHz in each ear. Speech Recognition Thresholds were obtained at  25dB HL in the right ear and at 30dB HL in the left ear. Word Recognition Testing was completed at  40dB SL and [name] scored 100% in each ear.    Results:  The test results were reviewed with [name]. She has essentially normal hearing. In the highest frequencies she has a mild loss, this would not impact speech understanding and is likely from noise exposure and maybe diabetes. No middle ear component to her hearing.  Recommendations: Follow up with medical care team for eustachian tube dysfunction and congestion. No further audiology follow up necessary.   28 minutes spent testing and counseling on results.    If you have any questions please feel free to contact me at [phone].  Ammie Ferrier Au.D.  Audiologist   06/19/2023  11:22 AM  Cc: Tollie Eth, NP

## 2023-06-24 ENCOUNTER — Encounter (HOSPITAL_BASED_OUTPATIENT_CLINIC_OR_DEPARTMENT_OTHER): Payer: Self-pay | Admitting: Physical Therapy

## 2023-06-24 ENCOUNTER — Ambulatory Visit (HOSPITAL_BASED_OUTPATIENT_CLINIC_OR_DEPARTMENT_OTHER): Payer: Medicare HMO | Admitting: Physical Therapy

## 2023-06-24 DIAGNOSIS — M6281 Muscle weakness (generalized): Secondary | ICD-10-CM

## 2023-06-24 DIAGNOSIS — G8929 Other chronic pain: Secondary | ICD-10-CM | POA: Diagnosis not present

## 2023-06-24 DIAGNOSIS — M25561 Pain in right knee: Secondary | ICD-10-CM | POA: Diagnosis not present

## 2023-06-24 DIAGNOSIS — M5459 Other low back pain: Secondary | ICD-10-CM

## 2023-06-24 DIAGNOSIS — M25562 Pain in left knee: Secondary | ICD-10-CM | POA: Diagnosis not present

## 2023-06-24 NOTE — Therapy (Signed)
OUTPATIENT PHYSICAL THERAPY THORACOLUMBAR TREATMENT   Patient Name: Monica Hunter MRN: [id number] DOB:July 13, 1980, 42 y.o., female Today's Date: 06/24/2023  END OF SESSION:  PT End of Session - 06/24/23 1408     Visit Number 7    Number of Visits 16    Date for PT Re-Evaluation 07/02/23    Authorization Type 07/03/2022    PT Start Time 1407    PT Stop Time 1445    PT Time Calculation (min) 38 min    Activity Tolerance Patient tolerated treatment well    Behavior During Therapy [facility] for tasks assessed/performed                Past Medical History:  Diagnosis Date   Asthma    Candidal dermatitis 11/04/2021   Cornea abrasion    Diabetes mellitus    Migraines    Morbid obesity (HCC)    Past Surgical History:  Procedure Laterality Date   CERVICAL POLYPECTOMY     DILATION AND CURETTAGE OF UTERUS     Patient Active Problem List   Diagnosis Date Noted   Vision changes 05/25/2023   Decreased hearing of both ears 05/25/2023   Osteoarthritis 05/25/2023   Allergic rhinitis 05/25/2023   Bilateral chronic knee pain 05/15/2023   Abscess 02/12/2023   Genetic testing 01/06/2023   Bilateral occipital neuralgia 08/13/2022   Chronic bilateral low back pain without sciatica 03/17/2022   Overflow incontinence of urine 12/03/2021   Type 2 diabetes mellitus with diabetic dermatitis, with long-term current use of insulin (HCC) 11/04/2021   Oligomenorrhea 11/04/2021   Gastroesophageal reflux disease 11/04/2021   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   Asthma 02/14/2018   Class 3 severe obesity due to excess calories without serious comorbidity with body mass index (BMI) of 50.0 to 59.9 in adult (HCC) 02/14/2018   Hidradenitis suppurativa 02/14/2018    PCP: Les Pou Early NP   REFERRING PROVIDER: Estill Bamberg Leftwitch   REFERRING DIAG:  Diagnosis  M54.50,G89.29 (ICD-10-CM) - Chronic midline low back pain without sciatica    Rationale for Evaluation and Treatment:  Rehabilitation  THERAPY DIAG:  Other low back pain  Muscle weakness (generalized)  Chronic pain of left knee  ONSET DATE: Many years   SUBJECTIVE:                                                                                                                                                                                           SUBJECTIVE STATEMENT: T"This is a good day, slept all day maybe that's what it takes. 0/10 pain"   FROM INITIAL EVAL: Patient has a long history of low back pain.  She reports she has had lower back pain since she was a teenager.  Over the past few years she has developed significant bilateral knee pain as well.  Per patient her her general mobility is decreased.  She enjoyed walking for exercise prior but she is unable to now at this time.  She lives on the second floor and has to go up the stairs sideways.  She feels like when she is walking downstairs her knees may buckle.  PERTINENT HISTORY:  DM II, Migraines; Morbid obesity;   PAIN:  Are you having pain? Yes: NPRS scale: 0/10 Pain location: lower middle back  Pain description: aching  Aggravating factors: mornings  Relieving factors: lay around    PRECAUTIONS: None   RED FLAGS: None   WEIGHT BEARING RESTRICTIONS: No  FALLS:  Has patient fallen in last 6 months? Larey Seat througha deck   LIVING ENVIRONMENT:  Stairs into the house OCCUPATION:  home care   Hobbies:  Like to get out to walk   PLOF: Independent  PATIENT GOALS:  To have less pain/ To be able to walk longer  NEXT MD VISIT:  Nothing scheduled   OBJECTIVE:  Note: Objective measures were completed at Evaluation unless otherwise noted.  DIAGNOSTIC FINDINGS:  Lumbar MRI  IMPRESSION: 1. Mild lower lumbar spondylosis as described above. Mild-to-moderate bilateral neuroforaminal stenosis at L5-S1.    PATIENT SURVEYS:  Unable to provide secondary to time  SCREENING FOR RED FLAGS: Bowel or bladder incontinence:  No Spinal tumors: No Cauda equina syndrome: No Compression fracture: No Abdominal aneurysm: No  COGNITION: Overall cognitive status: Within functional limits for tasks assessed     SENSATION: Good sensation     POSTURE: rounded shoulders, forward head, and flexed trunk   PALPATION: Tenderness palpation bilateral lower lumbar paraspinals from L3-S1    LUMBAR ROM:   AROM eval  Flexion Pulling at end range  Extension Significant pain  Right lateral flexion   Left lateral flexion   Right rotation Limited 25% with pulling  Left rotation Limited 25% with pulling   (Blank rows = not tested)  LOWER EXTREMITY ROM:     Active  Right eval Left eval  Hip flexion    Hip extension    Hip abduction    Hip adduction    Hip internal rotation    Hip external rotation    Knee flexion    Knee extension    Ankle dorsiflexion    Ankle plantarflexion    Ankle inversion    Ankle eversion     (Blank rows = not tested)  LOWER EXTREMITY MMT:    MMT Right eval Left eval  Hip flexion 28.1 27.1  Hip extension    Hip abduction 22.1 30.1  Hip adduction    Hip internal rotation    Hip external rotation    Knee flexion    Knee extension 18.1 24.5  Ankle dorsiflexion    Ankle plantarflexion    Ankle inversion    Ankle eversion     (Blank rows = not tested)   FUNCTIONAL TESTS:  Increased pain transferring from sit to stand  GAIT:  Significant lateral movement.  Decreased single-leg stance time on right   TODAY'S TREATMENT  06/24/23  Pt seen for aquatic therapy today.  Treatment took place in water 3.5-4.75 ft in depth at the [facility] pool. Temp of water was 91.  Pt entered/exited the pool via stairs independently with bilat rail.  * unsupported: walking forward/ backward with  relaxed arms at sides - multiple reps *standing ue support yellow HB: df, pf, hip add/abd, squat x 10-12  - ue support wall: 1/2 diamond * farmer carry with yellow hand floats  under water at side, walking forward / backward bilateral; then unilateral completed with abdominal bracing * side stepping with arm addct/ abdct x 2 widths *decompression with cycling; hip add/abd; hip flex/ext * TrA set with full hollow noodle pull down to thighs wide stance then staggered stance x 10   Pt requires the buoyancy and hydrostatic pressure of water for support, and to offload joints by unweighting joint load by at least 50 % in navel deep water and by at least 75-80% in chest to neck deep water.  Viscosity of the water is needed for resistance of strengthening. Water current perturbations provides challenge to standing balance requiring increased core activation.    12/18 Bilateral punch 2lbs 3x10  Bicpes curl 3lbs 3x10  Shoulder flexion 3x10   Hip abduction red 3x10  Hip adduction 3x10 red  Heel raise 3x10  March 2x10   At this time the patient requested the session end early 2nd to pain     12/4  Seated ball flexion forward 5 x 10-second hold  lateral 5 x 10-second holds Ball press x 20  Nu-step 5 min L3   Row red 2x10  Shoulder extension red  2x10   LAQ red 3x10 each leg  Hip adduction 3x10    Supine: LTRx10 each side          11/20  Manual:  Skilled palpation of trigger points; trigger point release; reviewe of self soft tissue mobilization   Reviewed and updated HEP.  Seated ball flexion forward 5 x 10-second hold  lateral 5 x 10-second holds Ball press x 20  Reviewed how to do for home  Reviewed self soft tissue mobilization using Thera cane and using tennis ball.  Patient given tennis ball for home.  Patient able to quickly find her trigger point on her left side with a tennis ball.  Bilateral ER red 2 x 12 Bilateral horizontal abduction red 2 x 12 Horizontal abduction with flexion red 2 x 12     11/13: DKTC with physioball 1.77mins (had pain in low back from supine position, so stopped) Seated HSS 2x20sec ea Sidelying clams  x20ea Seated march with TrA 2x10 LAQ 5" hold 2x10ea Adductor sqz with TrA seated 5" 2x10 Sit to stands from elevated plinth x10 Side stepping at rail with RTB at ankles HSC RTB 2x10ea PNF chops standing 2x10ea Seated lumbar flexion stretch 10sec x5   PATIENT EDUCATION:  Education details: HEP, symptom management, progression of activities, importance of cardiovascular activity. Person educated: Patient Education method: Explanation, Demonstration, Tactile cues, Verbal cues, and Handouts Education comprehension: verbalized understanding, returned demonstration, verbal cues required, tactile cues required, and needs further education  HOME EXERCISE PROGRAM: Access Code: [email or web address] URL: [email or web address] Date: 05/14/2023 Prepared by: Riki Altes  Exercises - Seated Flexion Stretch  - 2 x daily - 7 x weekly - 5 sets - 10-15 seconds hold - Standing 3-Way Leg Reach with Resistance at Ankles and Counter Support  - 2 x daily - 7 x weekly - 2 sets - 10 reps - Clamshell  - 2 x daily - 7 x weekly - 3 sets - 10 reps - Seated March  - 1 x daily - 7 x weekly - 3 sets - 10 reps - Seated Long Arc Quad  - 1 x daily -  7 x weekly - 3 sets - 10 reps - 5 seconds hold  ASSESSMENT:  CLINICAL IMPRESSION: No pain today upon initiation of session. She tolerates progression of aquatic exercises well. Able gain decompression position for le unloaded movement. Good ROM hips in all planes, unlimited due to lack of pain. Instruction on abd bracing with exercises today for added core engagement. She does report minor discomfort in back after session.  Of note: pt reports she has temporarily stopped taking her insulin and thinks it might be reason her LBP is decreased today. (She did not want me to document this).  She is instructed on the importance of consulting her MD prior to stopping and to reconsider.  Also that it is very likely that her decrease in LBP is unrelated.   Patient is a  42 year old with chronic history of back pain and bilateral knee pain.  Over the past few years her pain is decreased her ability to ambulate.  She lives on the second floor and has to go up the stairs sideways secondary to knee pain.  She has bilateral lower extremity weakness right greater than left.  She has increased  muscle tension and range lumbar rotation she has increased pain lumbar extension.  She has tenderness to palpation in lower lumbar paraspinals.  She would benefit from skilled therapy quad exercising to increase general strength and stability in her lumbar spine.   OBJECTIVE IMPAIRMENTS: Abnormal gait, decreased activity tolerance, decreased endurance, decreased mobility, difficulty walking, decreased ROM, decreased strength, postural dysfunction, obesity, and pain.   ACTIVITY LIMITATIONS: carrying, lifting, bending, sitting, standing, squatting, stairs, transfers, and locomotion level  PARTICIPATION LIMITATIONS: meal prep, cleaning, laundry, driving, shopping, and community activity  PERSONAL FACTORS: 3+ comorbidities: multi joint OA, DMII Migraines  are also affecting patient's functional outcome.   REHAB POTENTIAL: Excellent  CLINICAL DECISION MAKING: Evolving/moderate complexity  EVALUATION COMPLEXITY: Moderate   GOALS: Goals reviewed with patient? Yes  SHORT TERM GOALS: Target date: 06/05/2023  Patient will increase gross bilateral LE strength by 5 lbs  Baseline: Goal status: Continue to build HEP for strengthening 11/21  2.  Patient wil be independent with basic HEP for land and aquatics  Baseline:  Goal status: Continue to build program 11/21  3.  Patient will report a 25% reduction in pain in her knees and back  Baseline:  Goal status: No significant reduction in this at this time 11/21   LONG TERM GOALS: Target date: 07/03/2023    Patient will have a complete program that includes cardiovascular activity and an aquatic program  Baseline:  Goal status:  INITIAL  2.  Patient will go up and down 8 steps with reciprocal gait pattern with less than 2 out of 10 pain Baseline:  Goal status: INITIAL  3.  Patient will walk community distances for exercise without increased pain Baseline:  Goal status: INITIAL  PLAN:  PT FREQUENCY: 2x/week  PT DURATION: 8 weeks  PLANNED INTERVENTIONS: Therapeutic exercises, Therapeutic activity, Neuromuscular re-education, Balance training, Gait training, Patient/Family education, Self Care, Joint mobilization, Stair training, DME instructions, Aquatic Therapy, Dry Needling, Electrical stimulation, Cryotherapy, Moist heat, Taping, Manual therapy, and Re-evaluation. Marland Kitchen  PLAN FOR NEXT SESSION:   Land: Review self soft tissue mobilization with T-cane and tennis ball; consider seated flexion stretch. Consider supine quad strengthening if she can tolerate supine. If not consider sitting series. No HEP given 2nd to patient being late.   Aquatic: work on weight bearing and gait tolerance; Hip strengthening and core  stability.   [address] [place]) Maricarmen Braziel MPT 06/24/23 2:09 PM [facility] [address] [place], [place], [address] Phone: [phone]   Fax:  [phone]    Referring diagnosis?  Diagnosis  M54.50,G89.29 (ICD-10-CM) - Chronic midline low back pain without sciatica   Treatment diagnosis? (if different than referring diagnosis)  Diagnosis  M54.50,G89.29 (ICD-10-CM) - Chronic midline low back pain without sciatica   What was this (referring dx) caused by? []  Surgery []  Fall [x]  Ongoing issue [x]  Arthritis []  Other: ____________  Laterality: []  Rt []  Lt [x]  Both  Check all possible CPT codes:  *CHOOSE 10 OR LESS*   Check all possible CPT codes:  *CHOOSE 10 OR LESS*    Check all possible CPT codes:            *CHOOSE 10 OR LESS*                          []  97110 (Therapeutic Exercise)            []  92507 (SLP Treatment)  []  97112 (Neuro  Re-ed)                         []  92526 (Swallowing Treatment)             []  97116 (Gait Training)                           []  [id number] (Cognitive Training, 1st 15 minutes) []  97140 (Manual Therapy)                               []  97130 (Cognitive Training, each add'l 15 minutes)  []  97164 (Re-evaluation)                              []  Other, List CPT Code ____________  []  97530 (Therapeutic Activities)                                             []  97535 (Self Care)                    [x]  All codes above (97110 - 97535)           []  65784 (Mechanical Traction)           [x]  97014 (E-stim Unattended)           []  97032 (E-stim manual)           [x]  97033 (Ionto)           [x]  97035 (Ultrasound) []  97750 (Physical Performance Training) []  [id number] (Aquatic Therapy) []  97016 (Vasopneumatic Device) []  [id number] (Paraffin) []  97034 (Contrast Bath) []  97597 (Wound Care 1st 20 sq cm) []  97598 (Wound Care each add'l 20 sq cm) []  97760 (Orthotic Fabrication, Fitting, Training Initial) []  [id number] (Prosthetic Management and Training Initial) []  [id number] (Orthotic or Prosthetic Training/ Modification Subsequent)    See Planned Interventions listed in the Plan section of the Evaluation.

## 2023-06-30 ENCOUNTER — Encounter (HOSPITAL_BASED_OUTPATIENT_CLINIC_OR_DEPARTMENT_OTHER): Payer: Self-pay | Admitting: Physical Therapy

## 2023-06-30 ENCOUNTER — Ambulatory Visit (HOSPITAL_BASED_OUTPATIENT_CLINIC_OR_DEPARTMENT_OTHER): Payer: Medicare HMO | Admitting: Physical Therapy

## 2023-06-30 DIAGNOSIS — G8929 Other chronic pain: Secondary | ICD-10-CM | POA: Diagnosis not present

## 2023-06-30 DIAGNOSIS — M5459 Other low back pain: Secondary | ICD-10-CM | POA: Diagnosis not present

## 2023-06-30 DIAGNOSIS — M25561 Pain in right knee: Secondary | ICD-10-CM | POA: Diagnosis not present

## 2023-06-30 DIAGNOSIS — M6281 Muscle weakness (generalized): Secondary | ICD-10-CM | POA: Diagnosis not present

## 2023-06-30 DIAGNOSIS — M25562 Pain in left knee: Secondary | ICD-10-CM | POA: Diagnosis not present

## 2023-06-30 NOTE — Therapy (Signed)
OUTPATIENT PHYSICAL THERAPY THORACOLUMBAR TREATMENT   Patient Name: Monica Hunter MRN: [id number] DOB:09/20/1980, 42 y.o., female Today's Date: 06/30/2023  END OF SESSION:  PT End of Session - 06/30/23 1218     Visit Number 8    Number of Visits 16    Date for PT Re-Evaluation 07/02/23    Authorization Type [employer]/ [place] medicaid    Authorization Time Period 05/07/23-07/04/23    Authorization - Visit Number 8    Authorization - Number of Visits 16    PT Start Time 1220   pt arrived late   PT Stop Time 1248    PT Time Calculation (min) 28 min    Behavior During Therapy [facility] for tasks assessed/performed                Past Medical History:  Diagnosis Date   Asthma    Candidal dermatitis 11/04/2021   Cornea abrasion    Diabetes mellitus    Migraines    Morbid obesity (HCC)    Past Surgical History:  Procedure Laterality Date   CERVICAL POLYPECTOMY     DILATION AND CURETTAGE OF UTERUS     Patient Active Problem List   Diagnosis Date Noted   Vision changes 05/25/2023   Decreased hearing of both ears 05/25/2023   Osteoarthritis 05/25/2023   Allergic rhinitis 05/25/2023   Bilateral chronic knee pain 05/15/2023   Abscess 02/12/2023   Genetic testing 01/06/2023   Bilateral occipital neuralgia 08/13/2022   Chronic bilateral low back pain without sciatica 03/17/2022   Overflow incontinence of urine 12/03/2021   Type 2 diabetes mellitus with diabetic dermatitis, with long-term current use of insulin (HCC) 11/04/2021   Oligomenorrhea 11/04/2021   Gastroesophageal reflux disease 11/04/2021   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   Asthma 02/14/2018   Class 3 severe obesity due to excess calories without serious comorbidity with body mass index (BMI) of 50.0 to 59.9 in adult (HCC) 02/14/2018   Hidradenitis suppurativa 02/14/2018    PCP: Les Pou Early NP   REFERRING PROVIDER: Estill Bamberg Leftwitch   REFERRING DIAG:  Diagnosis  M54.50,G89.29 (ICD-10-CM) - Chronic midline  low back pain without sciatica    Rationale for Evaluation and Treatment: Rehabilitation  THERAPY DIAG:  Other low back pain  Muscle weakness (generalized)  Chronic pain of left knee  Chronic pain of right knee  ONSET DATE: Many years   SUBJECTIVE:                                                                                                                                                                                           SUBJECTIVE STATEMENT: "The last  appt I had was a good day. I felt no pain - during, or after".   Pt reports she "ate something wrong" and has been having stomach cramps for the last day. Pt reports that she is switching insurance next year - from [facility] to [facility], and doesn't think PT will be covered.  She reports she is having more good days than bad and attributes it to the exercises.    FROM INITIAL EVAL: Patient has a long history of low back pain.  She reports she has had lower back pain since she was a teenager.  Over the past few years she has developed significant bilateral knee pain as well.  Per patient her her general mobility is decreased.  She enjoyed walking for exercise prior but she is unable to now at this time.  She lives on the second floor and has to go up the stairs sideways.  She feels like when she is walking downstairs her knees may buckle.  PERTINENT HISTORY:  DM II, Migraines; Morbid obesity;   PAIN:  Are you having pain? Yes: NPRS scale: 10/10 Pain location: stomach Pain description: aching  Aggravating factors: mornings  Relieving factors: lay around    PRECAUTIONS: None   RED FLAGS: None   WEIGHT BEARING RESTRICTIONS: No  FALLS:  Has patient fallen in last 6 months? Larey Seat througha deck   LIVING ENVIRONMENT:  Stairs into the house OCCUPATION:  home care   Hobbies:  Like to get out to walk   PLOF: Independent  PATIENT GOALS:  To have less pain/ To be able to walk longer  NEXT MD VISIT:  Nothing scheduled    OBJECTIVE:  Note: Objective measures were completed at Evaluation unless otherwise noted.  DIAGNOSTIC FINDINGS:  Lumbar MRI  IMPRESSION: 1. Mild lower lumbar spondylosis as described above. Mild-to-moderate bilateral neuroforaminal stenosis at L5-S1.    PATIENT SURVEYS:  Unable to provide secondary to time  SCREENING FOR RED FLAGS: Bowel or bladder incontinence: No Spinal tumors: No Cauda equina syndrome: No Compression fracture: No Abdominal aneurysm: No  COGNITION: Overall cognitive status: Within functional limits for tasks assessed     SENSATION: Good sensation     POSTURE: rounded shoulders, forward head, and flexed trunk   PALPATION: Tenderness palpation bilateral lower lumbar paraspinals from L3-S1    LUMBAR ROM:   AROM eval  Flexion Pulling at end range  Extension Significant pain  Right lateral flexion   Left lateral flexion   Right rotation Limited 25% with pulling  Left rotation Limited 25% with pulling   (Blank rows = not tested)  LOWER EXTREMITY ROM:     Active  Right eval Left eval  Hip flexion    Hip extension    Hip abduction    Hip adduction    Hip internal rotation    Hip external rotation    Knee flexion    Knee extension    Ankle dorsiflexion    Ankle plantarflexion    Ankle inversion    Ankle eversion     (Blank rows = not tested)  LOWER EXTREMITY MMT:    MMT Right eval Left eval Right / Left  06/30/23  Hip flexion 28.1 27.1   Hip extension     Hip abduction 22.1 30.1   Hip adduction     Hip internal rotation     Hip external rotation     Knee flexion     Knee extension 18.1 24.5   Ankle dorsiflexion  Ankle plantarflexion     Ankle inversion     Ankle eversion      (Blank rows = not tested)   FUNCTIONAL TESTS:  Increased pain transferring from sit to stand  GAIT:  Significant lateral movement.  Decreased single-leg stance time on right   TODAY'S TREATMENT 06/30/23 Pt seen for aquatic therapy  today.  Treatment took place in water 3.5-4.75 ft in depth at the [facility] pool. Temp of water was 91.  Pt entered/exited the pool via stairs independently with bilat rail.  * unsupported: walking forward/ backward with relaxed arms at sides - multiple reps *standing ue support yellow HB: df, pf, hip add/abd, squat x 10-12  - ue support wall: 1/2 diamond * farmer carry with yellow hand floats under water at side, walking forward / backward bilateral; then unilateral completed with abdominal bracing * side stepping with arm addct/ abdct x 2 widths *decompression with cycling; hip add/abd; hip flex/ext * TrA set with alternating rainbow hand float pull down to thighs wide stance then staggered stance x 10     06/24/23 Pt seen for aquatic therapy today.  Treatment took place in water 3.5-4.75 ft in depth at the [facility] pool. Temp of water was 91.  Pt entered/exited the pool via stairs independently with bilat rail.  * unsupported: walking forward/ backward with relaxed arms at sides - multiple reps *standing ue support yellow HB: df, pf, hip add/abd, squat x 10-12  - ue support wall: 1/2 diamond * farmer carry with yellow hand floats under water at side, walking forward / backward bilateral; then unilateral completed with abdominal bracing * side stepping with arm addct/ abdct x 2 widths *decompression with cycling; hip add/abd; hip flex/ext * TrA set with full hollow noodle pull down to thighs wide stance then staggered stance x 10   12/18 Bilateral punch 2lbs 3x10  Bicpes curl 3lbs 3x10  Shoulder flexion 3x10   Hip abduction red 3x10  Hip adduction 3x10 red  Heel raise 3x10  March 2x10   At this time the patient requested the session end early 2nd to pain   12/4  Seated ball flexion forward 5 x 10-second hold  lateral 5 x 10-second holds Ball press x 20  Nu-step 5 min L3   Row red 2x10  Shoulder extension red  2x10   LAQ red 3x10 each leg  Hip  adduction 3x10    Supine: LTRx10 each side    11/20  Manual:  Skilled palpation of trigger points; trigger point release; reviewe of self soft tissue mobilization   Reviewed and updated HEP.  Seated ball flexion forward 5 x 10-second hold  lateral 5 x 10-second holds Ball press x 20  Reviewed how to do for home  Reviewed self soft tissue mobilization using Thera cane and using tennis ball.  Patient given tennis ball for home.  Patient able to quickly find her trigger point on her left side with a tennis ball.  Bilateral ER red 2 x 12 Bilateral horizontal abduction red 2 x 12 Horizontal abduction with flexion red 2 x 12     11/13: DKTC with physioball 1.59mins (had pain in low back from supine position, so stopped) Seated HSS 2x20sec ea Sidelying clams x20ea Seated march with TrA 2x10 LAQ 5" hold 2x10ea Adductor sqz with TrA seated 5" 2x10 Sit to stands from elevated plinth x10 Side stepping at rail with RTB at ankles HSC RTB 2x10ea PNF chops standing 2x10ea Seated lumbar flexion  stretch 10sec x5   PATIENT EDUCATION:  Education details: HEP, symptom management, progression of activities, importance of cardiovascular activity. Person educated: Patient Education method: Explanation, Demonstration, Tactile cues, Verbal cues, and Handouts Education comprehension: verbalized understanding, returned demonstration, verbal cues required, tactile cues required, and needs further education  HOME EXERCISE PROGRAM: Access Code: [email or web address] URL: [email or web address] Date: 05/14/2023 Prepared by: Riki Altes  Exercises - Seated Flexion Stretch  - 2 x daily - 7 x weekly - 5 sets - 10-15 seconds hold - Standing 3-Way Leg Reach with Resistance at Ankles and Counter Support  - 2 x daily - 7 x weekly - 2 sets - 10 reps - Clamshell  - 2 x daily - 7 x weekly - 3 sets - 10 reps - Seated March  - 1 x daily - 7 x weekly - 3 sets - 10 reps - Seated Long Arc Quad  - 1 x  daily - 7 x weekly - 3 sets - 10 reps - 5 seconds hold  ASSESSMENT:  CLINICAL IMPRESSION: Pt reports she has no pain in her back when in the water.  Session shortened due to pt's late arrival.  Unable to MMT due to high pain level upon arrival. Pt has met STG 3.  Pt reported changes in insurance in new year.  Will await news if she will gain coverage for additional PT visits.  Will recheck goals for recert, if pt returns.      Patient is a 42 year old with chronic history of back pain and bilateral knee pain.  Over the past few years her pain is decreased her ability to ambulate.  She lives on the second floor and has to go up the stairs sideways secondary to knee pain.  She has bilateral lower extremity weakness right greater than left.  She has increased  muscle tension and range lumbar rotation she has increased pain lumbar extension.  She has tenderness to palpation in lower lumbar paraspinals.  She would benefit from skilled therapy quad exercising to increase general strength and stability in her lumbar spine.   OBJECTIVE IMPAIRMENTS: Abnormal gait, decreased activity tolerance, decreased endurance, decreased mobility, difficulty walking, decreased ROM, decreased strength, postural dysfunction, obesity, and pain.   ACTIVITY LIMITATIONS: carrying, lifting, bending, sitting, standing, squatting, stairs, transfers, and locomotion level  PARTICIPATION LIMITATIONS: meal prep, cleaning, laundry, driving, shopping, and community activity  PERSONAL FACTORS: 3+ comorbidities: multi joint OA, DMII Migraines  are also affecting patient's functional outcome.   REHAB POTENTIAL: Excellent  CLINICAL DECISION MAKING: Evolving/moderate complexity  EVALUATION COMPLEXITY: Moderate   GOALS: Goals reviewed with patient? Yes  SHORT TERM GOALS: Target date: 06/05/2023  Patient will increase gross bilateral LE strength by 5 lbs  Baseline: Goal status: Continue to build HEP for strengthening  11/21  2.  Patient wil be independent with basic HEP for land and aquatics  Baseline:  Goal status: Continue to build program 11/21  3.  Patient will report a 25% reduction in pain in her knees and back  Baseline:  Goal status: 50% reduction - 06/30/23   LONG TERM GOALS: Target date: 07/03/2023    Patient will have a complete program that includes cardiovascular activity and an aquatic program  Baseline: currently no access to pool/gym Goal status: In PROGRESS - 06/30/23  2.  Patient will go up and down 8 steps with reciprocal gait pattern with less than 2 out of 10 pain Baseline:  Goal status: Has not attempted due to fear of falling- 06/30/23  3.  Patient will walk community distances for exercise without increased pain Baseline:  Goal status: INITIAL  PLAN:  PT FREQUENCY: 2x/week  PT DURATION: 8 weeks  PLANNED INTERVENTIONS: Therapeutic exercises, Therapeutic activity, Neuromuscular re-education, Balance training, Gait training, Patient/Family education, Self Care, Joint mobilization, Stair training, DME instructions, Aquatic Therapy, Dry Needling, Electrical stimulation, Cryotherapy, Moist heat, Taping, Manual therapy, and Re-evaluation. Marland Kitchen  PLAN FOR NEXT SESSION:   Land: continue progressive strengthening for hips/knees/core as tolerated.  Update HEP.   Aquatic: work on Hip strengthening and core stability.   Mayer Camel, PTA 06/30/23 1:03 PM [facility] [address] [place], [place], [address] Phone: [phone]   Fax:  [phone]     Referring diagnosis?  Diagnosis  M54.50,G89.29 (ICD-10-CM) - Chronic midline low back pain without sciatica   Treatment diagnosis? (if different than referring diagnosis)  Diagnosis  M54.50,G89.29 (ICD-10-CM) - Chronic midline low back pain without sciatica   What was this (referring dx) caused by? []  Surgery []  Fall [x]  Ongoing issue [x]  Arthritis []   Other: ____________  Laterality: []  Rt []  Lt [x]  Both  Check all possible CPT codes:  *CHOOSE 10 OR LESS*   Check all possible CPT codes:  *CHOOSE 10 OR LESS*    Check all possible CPT codes:            *CHOOSE 10 OR LESS*                          []  97110 (Therapeutic Exercise)            []  92507 (SLP Treatment)  []  97112 (Neuro Re-ed)                         []  92526 (Swallowing Treatment)             []  97116 (Gait Training)                           []  [id number] (Cognitive Training, 1st 15 minutes) []  97140 (Manual Therapy)                               []  97130 (Cognitive Training, each add'l 15 minutes)  []  97164 (Re-evaluation)                              []  Other, List CPT Code ____________  []  97530 (Therapeutic Activities)                                             []  97535 (Self Care)                    [x]  All codes above (97110 - 97535)           []  97012 (Mechanical Traction)           [x]  97014 (E-stim Unattended)           []  97032 (E-stim manual)           [x]  97033 (Ionto)           [x]  97035 (Ultrasound) []  97750 (Physical Performance Training) []  [id number] (  Aquatic Therapy) []  66440 (Vasopneumatic Device) []  [id number] (Paraffin) []  97034 (Contrast Bath) []  97597 (Wound Care 1st 20 sq cm) []  97598 (Wound Care each add'l 20 sq cm) []  97760 (Orthotic Fabrication, Fitting, Training Initial) []  [id number] (Prosthetic Management and Training Initial) []  [id number] (Orthotic or Prosthetic Training/ Modification Subsequent)    See Planned Interventions listed in the Plan section of the Evaluation.

## 2023-07-08 ENCOUNTER — Ambulatory Visit (HOSPITAL_BASED_OUTPATIENT_CLINIC_OR_DEPARTMENT_OTHER): Payer: 59 | Attending: Advanced Practice Midwife | Admitting: Physical Therapy

## 2023-07-08 ENCOUNTER — Encounter (HOSPITAL_BASED_OUTPATIENT_CLINIC_OR_DEPARTMENT_OTHER): Payer: Self-pay | Admitting: Physical Therapy

## 2023-07-08 DIAGNOSIS — G8929 Other chronic pain: Secondary | ICD-10-CM | POA: Diagnosis not present

## 2023-07-08 DIAGNOSIS — M25562 Pain in left knee: Secondary | ICD-10-CM | POA: Diagnosis not present

## 2023-07-08 DIAGNOSIS — M6281 Muscle weakness (generalized): Secondary | ICD-10-CM | POA: Diagnosis not present

## 2023-07-08 DIAGNOSIS — M5459 Other low back pain: Secondary | ICD-10-CM | POA: Diagnosis not present

## 2023-07-08 NOTE — Therapy (Signed)
 OUTPATIENT PHYSICAL THERAPY THORACOLUMBAR TREATMENT   Patient Name: Monica Hunter MRN: [id number] DOB:1980/07/23, 43 y.o., female Today's Date: 07/08/2023  END OF SESSION:  PT End of Session - 07/08/23 1633     Visit Number 9    Number of Visits 16    Date for PT Re-Evaluation 08/22/23    Authorization Type [employer]/  medicaid    Authorization Time Period 05/07/23-07/04/23    Authorization - Number of Visits 16    PT Start Time 1622    PT Stop Time 1700    PT Time Calculation (min) 38 min    Activity Tolerance Patient tolerated treatment well    Behavior During Therapy [facility] for tasks assessed/performed                Past Medical History:  Diagnosis Date   Asthma    Candidal dermatitis 11/04/2021   Cornea abrasion    Diabetes mellitus    Migraines    Morbid obesity (HCC)    Past Surgical History:  Procedure Laterality Date   CERVICAL POLYPECTOMY     DILATION AND CURETTAGE OF UTERUS     Patient Active Problem List   Diagnosis Date Noted   Vision changes 05/25/2023   Decreased hearing of both ears 05/25/2023   Osteoarthritis 05/25/2023   Allergic rhinitis 05/25/2023   Bilateral chronic knee pain 05/15/2023   Abscess 02/12/2023   Genetic testing 01/06/2023   Bilateral occipital neuralgia 08/13/2022   Chronic bilateral low back pain without sciatica 03/17/2022   Overflow incontinence of urine 12/03/2021   Type 2 diabetes mellitus with diabetic dermatitis, with long-term current use of insulin  (HCC) 11/04/2021   Oligomenorrhea 11/04/2021   Gastroesophageal reflux disease 11/04/2021   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   Asthma 02/14/2018   Class 3 severe obesity due to excess calories without serious comorbidity with body mass index (BMI) of 50.0 to 59.9 in adult (HCC) 02/14/2018   Hidradenitis suppurativa 02/14/2018    PCP: Camie Hun Early NP   REFERRING PROVIDER: Olam Fuelling Leftwitch   REFERRING DIAG:  Diagnosis  M54.50,G89.29 (ICD-10-CM) - Chronic midline  low back pain without sciatica    Rationale for Evaluation and Treatment: Rehabilitation  THERAPY DIAG:  Other low back pain  Muscle weakness (generalized)  Chronic pain of left knee  ONSET DATE: Many years   SUBJECTIVE:                                                                                                                                                                                           SUBJECTIVE STATEMENT: I have been feeling ok didn't sleep well last night  FROM INITIAL EVAL: Patient has a long history of low back pain.  She reports she has had lower back pain since she was a teenager.  Over the past few years she has developed significant bilateral knee pain as well.  Per patient her her general mobility is decreased.  She enjoyed walking for exercise prior but she is unable to now at this time.  She lives on the second floor and has to go up the stairs sideways.  She feels like when she is walking downstairs her knees may buckle.  PERTINENT HISTORY:  DM II, Migraines; Morbid obesity;   PAIN:  Are you having pain? Yes: NPRS scale: 7/10 Pain location: back Pain description: aching  Aggravating factors: mornings  Relieving factors: lay around    PRECAUTIONS: None   RED FLAGS: None   WEIGHT BEARING RESTRICTIONS: No  FALLS:  Has patient fallen in last 6 months? [place] througha deck   LIVING ENVIRONMENT:  Stairs into the house OCCUPATION:  home care   Hobbies:  Like to get out to walk   PLOF: Independent  PATIENT GOALS:  To have less pain/ To be able to walk longer  NEXT MD VISIT:  Nothing scheduled   OBJECTIVE:  Note: Objective measures were completed at Evaluation unless otherwise noted.  DIAGNOSTIC FINDINGS:  Lumbar MRI  IMPRESSION: 1. Mild lower lumbar spondylosis as described above. Mild-to-moderate bilateral neuroforaminal stenosis at L5-S1.    PATIENT SURVEYS:  Unable to provide secondary to time  SCREENING FOR RED  FLAGS: Bowel or bladder incontinence: No Spinal tumors: No Cauda equina syndrome: No Compression fracture: No Abdominal aneurysm: No  COGNITION: Overall cognitive status: Within functional limits for tasks assessed     SENSATION: Good sensation     POSTURE: rounded shoulders, forward head, and flexed trunk   PALPATION: Tenderness palpation bilateral lower lumbar paraspinals from L3-S1    LUMBAR ROM:   AROM eval 07/08/23  Flexion Pulling at end range full  Extension Significant pain Wfl pain end range  Right lateral flexion    Left lateral flexion    Right rotation Limited 25% with pulling Limited 50% pulling  Left rotation Limited 25% with pulling Limited 50% pulling   (Blank rows = not tested)  LOWER EXTREMITY ROM:     Active  Right eval Left eval  Hip flexion    Hip extension    Hip abduction    Hip adduction    Hip internal rotation    Hip external rotation    Knee flexion    Knee extension    Ankle dorsiflexion    Ankle plantarflexion    Ankle inversion    Ankle eversion     (Blank rows = not tested)  LOWER EXTREMITY MMT:    MMT Right eval Left eval Right / Left  07/08/23  Hip flexion 28.1 27.1 45.9 / 40  Hip extension     Hip abduction 22.1 30.1 45.9 / 48.7  Hip adduction     Hip internal rotation     Hip external rotation     Knee flexion     Knee extension 18.1 24.5   Ankle dorsiflexion     Ankle plantarflexion     Ankle inversion     Ankle eversion      (Blank rows = not tested)   FUNCTIONAL TESTS:  Increased pain transferring from sit to stand  GAIT:  Significant lateral movement.  Decreased single-leg stance time on right   TODAY'S TREATMENT  07/08/23  Re-assess completed   Pt seen for aquatic therapy today.  Treatment took place in water 3.5-4.75 ft in depth at the [facility] pool. Temp of water was 91.  Pt entered/exited the pool via stairs independently with bilat rail.  * unsupported: walking forward/ backward  with relaxed arms at sides - multiple reps *standing ue support yellow HB: df, pf, hip add/abd; squat; hip flex/ext  - ue support wall: 1/2 diamond * farmer carry with yellow hand floats under water at side, walking forward / backward bilateral; then unilateral completed with abdominal bracing * TrA set with alternating rainbow hand float pull down to thighs wide stance then staggered stance x 10 * side stepping with arm addct/ abdct x 2 widths *decompression with cycling; hip add/abd; hip flex/ext    06/30/23 Pt seen for aquatic therapy today.  Treatment took place in water 3.5-4.75 ft in depth at the [facility] pool. Temp of water was 91.  Pt entered/exited the pool via stairs independently with bilat rail.  * unsupported: walking forward/ backward with relaxed arms at sides - multiple reps *standing ue support yellow HB: df, pf, hip add/abd, squat x 10-12  - ue support wall: 1/2 diamond * farmer carry with yellow hand floats under water at side, walking forward / backward bilateral; then unilateral completed with abdominal bracing * side stepping with arm addct/ abdct x 2 widths *decompression with cycling; hip add/abd; hip flex/ext * TrA set with alternating rainbow hand float pull down to thighs wide stance then staggered stance x 10     06/24/23 Pt seen for aquatic therapy today.  Treatment took place in water 3.5-4.75 ft in depth at the [facility] pool. Temp of water was 91.  Pt entered/exited the pool via stairs independently with bilat rail.  * unsupported: walking forward/ backward with relaxed arms at sides - multiple reps *standing ue support yellow HB: df, pf, hip add/abd, squat x 10-12  - ue support wall: 1/2 diamond * farmer carry with yellow hand floats under water at side, walking forward / backward bilateral; then unilateral completed with abdominal bracing * side stepping with arm addct/ abdct x 2 widths *decompression with cycling; hip add/abd;  hip flex/ext * TrA set with full hollow noodle pull down to thighs wide stance then staggered stance x 10   12/18 Bilateral punch 2lbs 3x10  Bicpes curl 3lbs 3x10  Shoulder flexion 3x10   Hip abduction red 3x10  Hip adduction 3x10 red  Heel raise 3x10  March 2x10   At this time the patient requested the session end early 2nd to pain   12/4  Seated ball flexion forward 5 x 10-second hold  lateral 5 x 10-second holds Ball press x 20  Nu-step 5 min L3   Row red 2x10  Shoulder extension red  2x10   LAQ red 3x10 each leg  Hip adduction 3x10    Supine: LTRx10 each side    11/20  Manual:  Skilled palpation of trigger points; trigger point release; reviewe of self soft tissue mobilization   Reviewed and updated HEP.  Seated ball flexion forward 5 x 10-second hold  lateral 5 x 10-second holds Ball press x 20  Reviewed how to do for home  Reviewed self soft tissue mobilization using Thera cane and using tennis ball.  Patient given tennis ball for home.  Patient able to quickly find her trigger point on her left side with a tennis ball.  Bilateral ER red 2 x 12  Bilateral horizontal abduction red 2 x 12 Horizontal abduction with flexion red 2 x 12     11/13: DKTC with physioball 1.48mins (had pain in low back from supine position, so stopped) Seated HSS 2x20sec ea Sidelying clams x20ea Seated march with TrA 2x10 LAQ 5 hold 2x10ea Adductor sqz with TrA seated 5 2x10 Sit to stands from elevated plinth x10 Side stepping at rail with RTB at ankles HSC RTB 2x10ea PNF chops standing 2x10ea Seated lumbar flexion stretch 10sec x5   PATIENT EDUCATION:  Education details: HEP, symptom management, progression of activities, importance of cardiovascular activity. Person educated: Patient Education method: Explanation, Demonstration, Tactile cues, Verbal cues, and Handouts Education comprehension: verbalized understanding, returned demonstration, verbal cues required,  tactile cues required, and needs further education  HOME EXERCISE PROGRAM: Access Code: [email or web address] URL: [email or web address] Date: 05/14/2023 Prepared by: Asberry Rodes  Exercises - Seated Flexion Stretch  - 2 x daily - 7 x weekly - 5 sets - 10-15 seconds hold - Standing 3-Way Leg Reach with Resistance at Ankles and Counter Support  - 2 x daily - 7 x weekly - 2 sets - 10 reps - Clamshell  - 2 x daily - 7 x weekly - 3 sets - 10 reps - Seated March  - 1 x daily - 7 x weekly - 3 sets - 10 reps - Seated Long Arc Quad  - 1 x daily - 7 x weekly - 3 sets - 10 reps - 5 seconds hold   Aquatic This aquatic home exercise program from [employer] utilizes pictures from land based exercises, but has been adapted prior to lamination and issuance.    Access Code: [email or web address] URL: [email or web address] Date: 07/08/2023 Prepared by: Matilda Kohut  Exercises - Hand Buoy Carry  - 1 x daily - 1-3 x weekly - Side Stepping  - 1 x daily - 1-3 x weekly - Standing 'L' Stretch at [facility]  - 1 x daily - 1-3 x weekly - 1-2 sets - 3 reps - Squat  - 1 x daily - 1-3 x weekly - 1-2 sets - 10 reps - Standing Hip Flexion Extension at [employer]  - 1 x daily - 1-3 x weekly - 1-2 sets - 10 reps - Standing March at [facility]  - 1 x daily - 1-3 x weekly - 1-2 sets - 10 reps - Noodle press  - 1 x daily - 1-3 x weekly - 1-2 sets - 10 reps - Drawing Bow  - 1 x daily - 1-3 x weekly - 1-2 sets - 10 reps ASSESSMENT:  CLINICAL IMPRESSION: PN: Pt arrives late for session. She has completed approx half of initially anticipated treatments.  Muscle testing demonstrates good improvement meeting/exceeding goal.  Aquatic HEP initiated. Pt has land based HEP.  Her pain goal has been met but she continues to have pain sensitivity that waxes and wanes. Lumbar ROM improved. She will continue to benefit from a few more sessions.  Plan on 2 more aquatic to finish instructing on final aquatic HEP then return to  land based.      Patient is a 43 year old with chronic history of back pain and bilateral knee pain.  Over the past few years her pain is decreased her ability to ambulate.  She lives on the second floor and has to go up the stairs sideways secondary to knee pain.  She has bilateral lower extremity weakness right greater than left.  She has increased  muscle tension and range lumbar rotation  she has increased pain lumbar extension.  She has tenderness to palpation in lower lumbar paraspinals.  She would benefit from skilled therapy quad exercising to increase general strength and stability in her lumbar spine.   OBJECTIVE IMPAIRMENTS: Abnormal gait, decreased activity tolerance, decreased endurance, decreased mobility, difficulty walking, decreased ROM, decreased strength, postural dysfunction, obesity, and pain.   ACTIVITY LIMITATIONS: carrying, lifting, bending, sitting, standing, squatting, stairs, transfers, and locomotion level  PARTICIPATION LIMITATIONS: meal prep, cleaning, laundry, driving, shopping, and community activity  PERSONAL FACTORS: 3+ comorbidities: multi joint OA, DMII Migraines  are also affecting patient's functional outcome.   REHAB POTENTIAL: Excellent  CLINICAL DECISION MAKING: Evolving/moderate complexity  EVALUATION COMPLEXITY: Moderate   GOALS: Goals reviewed with patient? Yes  SHORT TERM GOALS: Target date: 06/05/2023  Patient will increase gross bilateral LE strength by 5 lbs  Baseline: Goal status: Continue to build HEP for strengthening 11/21; MET 07/08/23  2.  Patient wil be independent with basic HEP for land and aquatics  Baseline:  Goal status: Continue to build program 11/21; in progress 07/07/22  3.  Patient will report a 25% reduction in pain in her knees and back  Baseline:  Goal status: 50% reduction - 06/30/23   LONG TERM GOALS: Target date: 08/22/23    Patient will have a complete program that includes cardiovascular activity and an  aquatic program  Baseline: currently no access to pool/gym Goal status: In PROGRESS - 06/30/23  2.  Patient will go up and down 8 steps with reciprocal gait pattern with less than 2 out of 10 pain Baseline:  Goal status: Has not attempted due to fear of falling- 06/30/23  3.  Patient will walk community distances for exercise without increased pain Baseline:  Goal status: INITIAL  PLAN:  PT FREQUENCY: 2x/week  PT DURATION: 8 weeks  PLANNED INTERVENTIONS: Therapeutic exercises, Therapeutic activity, Neuromuscular re-education, Balance training, Gait training, Patient/Family education, Self Care, Joint mobilization, Stair training, DME instructions, Aquatic Therapy, Dry Needling, Electrical stimulation, Cryotherapy, Moist heat, Taping, Manual therapy, and Re-evaluation. SABRA  PLAN FOR NEXT SESSION:   Land: continue progressive strengthening for hips/knees/core as tolerated.  Update HEP.   Aquatic: work on Hip strengthening and core stability.   [place]onal [place]) Andrian Urbach MPT 07/08/23 5:12 PM [facility] [address] [place], [place], [address] Phone: [phone]   Fax:  [phone]      Referring diagnosis?  Diagnosis  M54.50,G89.29 (ICD-10-CM) - Chronic midline low back pain without sciatica   Treatment diagnosis? (if different than referring diagnosis)  Diagnosis  M54.50,G89.29 (ICD-10-CM) - Chronic midline low back pain without sciatica   What was this (referring dx) caused by? []  Surgery []  Fall [x]  Ongoing issue [x]  Arthritis []  Other: ____________  Laterality: []  Rt []  Lt [x]  Both  Check all possible CPT codes:  *CHOOSE 10 OR LESS*   Check all possible CPT codes:  *CHOOSE 10 OR LESS*    Check all possible CPT codes:            *CHOOSE 10 OR LESS*                          []  97110 (Therapeutic Exercise)            []  07492 (SLP Treatment)  []  97112 (Neuro Re-ed)                         []  92526 (  Swallowing  Treatment)             []  [phone] (Gait Training)                           []  [id number] (Cognitive Training, 1st 15 minutes) []  97140 (Manual Therapy)                               []  97130 (Cognitive Training, each add'l 15 minutes)  []  97164 (Re-evaluation)                              []  Other, List CPT Code ____________  []  97530 (Therapeutic Activities)                                             []  97535 (Self Care)                    [x]  All codes above (97110 - 97535)           []  97012 (Mechanical Traction)           [x]  97014 (E-stim Unattended)           []  97032 (E-stim manual)           [x]  97033 (Ionto)           [x]  97035 (Ultrasound) []  97750 (Physical Performance Training) []  [id number] (Aquatic Therapy) []  97016 (Vasopneumatic Device) []  [id number] (Paraffin) []  97034 (Contrast Bath) []  97597 (Wound Care 1st 20 sq cm) []  97598 (Wound Care each add'l 20 sq cm) []  97760 (Orthotic Fabrication, Fitting, Training Initial) []  [id number] (Prosthetic Management and Training Initial) []  [id number] (Orthotic or Prosthetic Training/ Modification Subsequent)    See Planned Interventions listed in the Plan section of the Evaluation.

## 2023-07-09 ENCOUNTER — Other Ambulatory Visit (HOSPITAL_COMMUNITY): Payer: Self-pay

## 2023-07-17 ENCOUNTER — Encounter: Payer: Self-pay | Admitting: Internal Medicine

## 2023-07-17 ENCOUNTER — Other Ambulatory Visit: Payer: Self-pay

## 2023-07-17 ENCOUNTER — Other Ambulatory Visit (HOSPITAL_COMMUNITY): Payer: Self-pay

## 2023-07-17 ENCOUNTER — Ambulatory Visit (INDEPENDENT_AMBULATORY_CARE_PROVIDER_SITE_OTHER): Payer: 59 | Admitting: Internal Medicine

## 2023-07-17 VITALS — BP 136/84 | HR 84 | Ht 68.0 in | Wt 367.0 lb

## 2023-07-17 DIAGNOSIS — Z7985 Long-term (current) use of injectable non-insulin antidiabetic drugs: Secondary | ICD-10-CM | POA: Diagnosis not present

## 2023-07-17 DIAGNOSIS — Z794 Long term (current) use of insulin: Secondary | ICD-10-CM | POA: Diagnosis not present

## 2023-07-17 DIAGNOSIS — E119 Type 2 diabetes mellitus without complications: Secondary | ICD-10-CM | POA: Diagnosis not present

## 2023-07-17 DIAGNOSIS — E1165 Type 2 diabetes mellitus with hyperglycemia: Secondary | ICD-10-CM | POA: Diagnosis not present

## 2023-07-17 LAB — POCT GLYCOSYLATED HEMOGLOBIN (HGB A1C): Hemoglobin A1C: 6.9 % — AB (ref 4.0–5.6)

## 2023-07-17 MED ORDER — DEXCOM G7 SENSOR MISC
3 refills | Status: DC
  Filled 2023-07-17: qty 9, 90d supply, fill #0
  Filled 2023-07-17: qty 3, 30d supply, fill #0

## 2023-07-17 MED ORDER — INSULIN PEN NEEDLE 32G X 4 MM MISC
1.0000 | Freq: Every day | 3 refills | Status: DC
  Filled 2023-07-17 (×2): qty 100, 100d supply, fill #0

## 2023-07-17 MED ORDER — INSULIN GLARGINE 100 UNITS/ML SOLOSTAR PEN
20.0000 [IU] | PEN_INJECTOR | Freq: Every day | SUBCUTANEOUS | 3 refills | Status: DC
  Filled 2023-07-17 – 2023-11-17 (×4): qty 30, 150d supply, fill #0

## 2023-07-17 MED ORDER — TIRZEPATIDE 5 MG/0.5ML ~~LOC~~ SOAJ
5.0000 mg | SUBCUTANEOUS | 3 refills | Status: DC
  Filled 2023-07-17: qty 2, 28d supply, fill #0
  Filled 2023-08-06: qty 6, 84d supply, fill #0

## 2023-07-17 NOTE — Patient Instructions (Signed)
Decrease Lantus 20 units once daily Increase Mounjaro 5 mg once weekly    HOW TO TREAT LOW BLOOD SUGARS (Blood sugar LESS THAN 70 MG/DL) Please follow the RULE OF 15 for the treatment of hypoglycemia treatment (when your (blood sugars are less than 70 mg/dL)   STEP 1: Take 15 grams of carbohydrates when your blood sugar is low, which includes:  3-4 GLUCOSE TABS  OR 3-4 OZ OF JUICE OR REGULAR SODA OR ONE TUBE OF GLUCOSE GEL    STEP 2: RECHECK blood sugar in 15 MINUTES STEP 3: If your blood sugar is still low at the 15 minute recheck --> then, go back to STEP 1 and treat AGAIN with another 15 grams of carbohydrates.

## 2023-07-17 NOTE — Progress Notes (Signed)
Name: Monica Hunter  MRN/ DOB: [id number], 1981-06-05   Age/ Sex: 43 y.o., female    PCP: Early, Sung Amabile, NP   Reason for Endocrinology Evaluation: Type 2 Diabetes Mellitus     Date of Initial Endocrinology Visit: 08/05/2022    PATIENT IDENTIFIER: Monica Hunter is a 43 y.o. female with a past medical history of DM and Adthma . The patient presented for initial endocrinology clinic visit on 08/05/2022 for consultative assistance with her diabetes management.    HPI: Monica Hunter was    Diagnosed with DM years  Prior Medications tried/Intolerance: Metformin-diarrhea , Ozempic - vomiting       Hemoglobin A1c has ranged from 6.9% in 2023, peaking at 14.2% in 2023.  Has hx of DKA's in the past , last admission 09/2021   On her initial visit to our clinic she had an A1c of 7.5%, she had Trulicity on her medication list that she has not taken for approximately 3 months prior to her presentation.  Which we restarted  We discontinued Ozempic due to nausea and heartburn and started [facility] as well as Lantus 01/2023 with an A1c of 12.1%    SUBJECTIVE:   During the last visit (02/04/2023): A1c 12.1%    Today (07/17/23): Monica Hunter is here for follow-up on diabetes management.  She stopped using the Dexcom after her insurance stopped covering.  Patient undergoing physical therapy for back pain Denies recent heartburn or nausea  Has constipation   HOME DIABETES REGIMEN: Mounjaro 2.5 mg weekly Lantus 24 units daily     Statin: no ACE-I/ARB: no  CONTINUOUS GLUCOSE MONITORING RECORD INTERPRETATION    Dates of Recording: 1/3-1/16/2025  Sensor description:dexcom  Results statistics:   CGM use % of time 50  Average and SD 163/36  Time in range  71   %  % Time Above 180 26  % Time above 250 3  % Time Below target 0   Glycemic patterns summary: BG's trend down overnight and fluctuate during the day  Hyperglycemic episodes postprandial  Hypoglycemic episodes  occurred N/A  Overnight periods: Trends down    DIABETIC COMPLICATIONS: Microvascular complications:   Denies: CKD, retinopathic  Last eye exam: Completed 2023  Macrovascular complications:   Denies: CAD, PVD, CVA   PAST HISTORY: Past Medical History:  Past Medical History:  Diagnosis Date   Asthma    Candidal dermatitis 11/04/2021   Cornea abrasion    Diabetes mellitus    Migraines    Morbid obesity (HCC)    Past Surgical History:  Past Surgical History:  Procedure Laterality Date   CERVICAL POLYPECTOMY     DILATION AND CURETTAGE OF UTERUS      Social History:  reports that she has quit smoking. Her smoking use included cigarettes. She has been exposed to tobacco smoke. She has never used smokeless tobacco. She reports that she does not drink alcohol and does not use drugs. Family History:  Family History  Problem Relation Age of Onset   Allergic rhinitis Mother    Diabetes Mother    Sinusitis Mother    Sinusitis Father    Allergic rhinitis Father    Diabetes Father    Sinusitis Sister    Allergic rhinitis Sister    Infertility Sister    Sinusitis Brother    Allergic rhinitis Brother    Sinusitis Brother    Asthma Brother    Allergic rhinitis Brother    Cancer Maternal Grandmother 19  unknown type, metastatic   Diabetes Paternal Grandmother    Asthma Maternal Aunt    COPD Maternal Aunt    Breast cancer Maternal Aunt 30 - 74       unilat. mast.   Cancer Other        unknown type; one dx in 66s   Immunodeficiency Neg Hx    Eczema Neg Hx    Angioedema Neg Hx    Atopy Neg Hx    Urticaria Neg Hx      HOME MEDICATIONS: Allergies as of 07/17/2023       Reactions   Ceftriaxone Hives, Itching   Methocarbamol Itching, Rash   Sulfa Antibiotics Other (See Comments), Hives, Itching, Rash   Reaction unknown        Medication List        Accurate as of July 17, 2023 11:49 AM. If you have any questions, ask your nurse or doctor.           Aerochamber Plus Device Use as directed with Metered Dose Inhaler   albuterol 108 (90 Base) MCG/ACT inhaler Commonly known as: VENTOLIN HFA Inhale 2 puffs into the lungs every 6 (six) hours as needed for wheezing or shortness of breath.   albuterol (2.5 MG/3ML) 0.083% nebulizer solution Commonly known as: PROVENTIL Take 3 mLs (2.5 mg total) by nebulization every 4 (four) hours as needed for wheezing or shortness of breath.   azelastine 0.1 % nasal spray Commonly known as: ASTELIN Place 2 sprays into both nostrils 2 (two) times daily. Use in each nostril as directed   Azelastine-Fluticasone 137-50 MCG/ACT Susp Commonly known as: Dymista 1 spray each nostril twice a day for congestion or nasal drainage.   bacitracin 500 UNIT/GM ointment Apply 1 Application topically 2 (two) times daily.   Breztri Aerosphere 160-9-4.8 MCG/ACT Aero Generic drug: Budeson-Glycopyrrol-Formoterol Inhale 2 puffs into the lungs in the morning and at bedtime.   budesonide-formoterol 160-4.5 MCG/ACT inhaler Commonly known as: Symbicort Inhale 2 puffs into the lungs 2 (two) times daily.   cromolyn 4 % ophthalmic solution Commonly known as: OPTICROM Place 1 drop into both eyes 4 (four) times daily as needed (itchy/watery eyes).   Dexcom G7 Receiver Devi Use to check BG   Dexcom G7 Sensor Misc Apply one sensor to the abdomen every 10 days.   diclofenac 50 MG EC tablet Commonly known as: VOLTAREN Take 1 tablet (50 mg total) by mouth as needed.   famotidine 20 MG tablet Commonly known as: PEPCID Take 1 tablet (20 mg total) by mouth 2 (two) times daily as needed (hives).   fluconazole 150 MG tablet Commonly known as: DIFLUCAN Take one tablet today, and one in 3 days. Repeat in 2 weeks if symptoms return.   fluoruracil 0.5 % cream Commonly known as: CARAC Apply topically daily. Apply 1 drop of salicylic acid with pea sized amount of fluorouracil to the top of the wart only daily using q-tip.  Allow to dry and cover with bandage.   fluticasone 50 MCG/ACT nasal spray Commonly known as: FLONASE Place 2 sprays into both nostrils daily.   hydrOXYzine 25 MG tablet Commonly known as: ATARAX TAKE 1 TABLET BY MOUTH EVERY 8 HOURS AS NEEDED.   Insulin Pen Needle 32G X 4 MM Misc 1 Device by Does not apply route daily in the afternoon.   Lantus SoloStar 100 UNIT/ML Solostar Pen Generic drug: insulin glargine Inject 24 Units into the skin daily.   levocetirizine 5 MG tablet Commonly known as:  XYZAL Take 1 tablet (5 mg total) by mouth 2 (two) times daily.   montelukast 10 MG tablet Commonly known as: SINGULAIR Take 1 tablet (10 mg total) by mouth at bedtime.   Mounjaro 2.5 MG/0.5ML Pen Generic drug: tirzepatide Inject 2.5 mg into the skin once a week.   norethindrone 0.35 MG tablet Commonly known as: MICRONOR Take 1 tablet (0.35 mg total) by mouth daily.   nystatin ointment Commonly known as: MYCOSTATIN Apply 1 Application topically 2 (two) times daily.   ondansetron 4 MG disintegrating tablet Commonly known as: ZOFRAN-ODT Take 1 tablet (4 mg total) by mouth every 8 (eight) hours as needed for nausea or vomiting.   oxymetazoline 0.05 % nasal spray Commonly known as: AFRIN 2 sprays each nostril twice a day for next 3 days.  Wait until you can breathe through nose then use Dymista   pantoprazole 40 MG tablet Commonly known as: PROTONIX Take 1 tablet (40 mg total) by mouth daily.   Salicylic Acid Wart Remover 27.5 % Liqd Generic drug: Salicylic Acid Apply 1 drop of salicylic acid with pea sized amount of fluorouracil to the top of the wart only daily using q-tip. Allow to dry and cover with bandage.   SUMAtriptan 100 MG tablet Commonly known as: IMITREX Take 1 tablet (100 mg total) by mouth as needed for migraine. May repeat in 2 hours if headache persists or recurs.   triamcinolone cream 0.1 % Commonly known as: KENALOG Apply 1 application. topically 2 (two)  times daily. To affected area(s) as needed   valACYclovir 1000 MG tablet Commonly known as: VALTREX Take 1 tablet (1,000 mg total) by mouth daily.   vitamin A & D ointment Apply 1 application. topically as needed for dry skin. Apply twice a day to rectum and vaginal area with nystatin cream.         ALLERGIES: Allergies  Allergen Reactions   Ceftriaxone Hives and Itching   Methocarbamol Itching and Rash   Sulfa Antibiotics Other (See Comments), Hives, Itching and Rash    Reaction unknown     REVIEW OF SYSTEMS: A comprehensive ROS was conducted with the patient and is negative except as per HPI     OBJECTIVE:   VITAL SIGNS: BP 136/84 (BP Location: Left Arm, Patient Position: Sitting, Cuff Size: Normal)   Pulse 84   Ht 5\' 8"  (1.727 m)   Wt (!) 367 lb (166.5 kg)   SpO2 99%   BMI 55.80 kg/m    PHYSICAL EXAM:  General: Pt appears well and is in NAD  Neck: General: Supple without adenopathy or carotid bruits. Thyroid: Thyroid size normal.  No goiter or nodules appreciated.   Lungs: Clear with good BS bilat with no rales, rhonchi, or wheezes  Heart: RRR   Extremities:  Lower extremities - trace pretibial edema.  Neuro: MS is good with appropriate affect, pt is alert and Ox3    DM foot exam: 07/17/2023  The skin of the feet is intact without sores or ulcerations. The pedal pulses are 2+ on right and 2+ on left. The sensation is intact to a screening 5.07, 10 gram monofilament bilaterally    DATA REVIEWED:  Lab Results  Component Value Date   HGBA1C 9.3 (H) 05/15/2023   HGBA1C 12.1 (A) 02/04/2023   HGBA1C 7.5 (A) 08/05/2022    Latest Reference Range & Units 05/15/23 11:12  Sodium 134 - 144 mmol/L 139  Potassium 3.5 - 5.2 mmol/L 4.2  Chloride 96 - 106 mmol/L 103  CO2 20 - 29 mmol/L 22  Glucose 70 - 99 mg/dL 086 (H)  BUN 6 - 24 mg/dL 9  Creatinine 5.78 - 4.69 mg/dL 6.29 (L)  Calcium 8.7 - 10.2 mg/dL 9.6  BUN/Creatinine Ratio 9 - 23  18  eGFR >59  mL/min/1.73 122  Alkaline Phosphatase 44 - 121 IU/L 127 (H)  Albumin 3.9 - 4.9 g/dL 4.0  AST 0 - 40 IU/L 18  ALT 0 - 32 IU/L 19  Total Protein 6.0 - 8.5 g/dL 7.4  Total Bilirubin 0.0 - 1.2 mg/dL 0.2     Old records , labs and images have been reviewed.      ASSESSMENT / PLAN / RECOMMENDATIONS:   1) Type 2 Diabetes Mellitus, Optimally controlled, Without complications - Most recent A1c of 6.9 %. Goal A1c < 7.0 %.     -A1c is down from 12.1% to 6.9% -She endorsed nausea and heartburn with Ozempic, but has been tolerating Mounjaro well on PPI -Will increase Mounjaro and decrease Lantus   MEDICATIONS:  Increase Mounjaro 5 mg weekly Decrease Lantus 20 units daily  EDUCATION / INSTRUCTIONS: BG monitoring instructions: Patient is instructed to check her blood sugars 3 times a day. Call [facility] if: BG persistently < 70  I reviewed the Rule of 15 for the treatment of hypoglycemia in detail with the patient. Literature supplied.   2) Diabetic complications:  Eye: Does not have known diabetic retinopathy.  Neuro/ Feet: Does not have known diabetic peripheral neuropathy. Renal: Patient does not have known baseline CKD. She is not on an ACEI/ARB at present.   Follow-up in 4 months  I spent 27 minutes preparing to see the patient by review of recent labs, imaging and procedures, obtaining and reviewing separately obtained history, communicating with the patient/, ordering medications, tests or procedures, and documenting clinical information in the EHR including the differential Dx, treatment, and any further evaluation and other management    Signed electronically by: Lyndle Herrlich, MD  [facility] Endocrinology  [facility] [address] [place] [place], [place] [address] Phone: [phone] FAX: [phone]   CC: Tollie Eth, NP [address] [place] [address] Phone: [phone]  Fax:  [phone]    Return to Endocrinology clinic as below: Future Appointments  Date Time Provider Department Center  08/21/2023 11:00 AM Marcelyn Bruins, MD [facility] None  11/04/2023  9:30 AM Early, Sung Amabile, NP [facility]

## 2023-07-18 ENCOUNTER — Encounter: Payer: Self-pay | Admitting: Internal Medicine

## 2023-07-18 LAB — MICROALBUMIN / CREATININE URINE RATIO
Creatinine, Urine: 101 mg/dL (ref 20–275)
Microalb Creat Ratio: 10 mg/g{creat} (ref <30)
Microalb, Ur: 1 mg/dL

## 2023-07-21 ENCOUNTER — Other Ambulatory Visit: Payer: Self-pay

## 2023-07-21 ENCOUNTER — Telehealth: Payer: Self-pay

## 2023-07-21 NOTE — Progress Notes (Signed)
Care Guide Pharmacy Note  07/21/2023 Name: Monica Hunter MRN: [id number] DOB: 02/10/1981  Referred By: Tollie Eth, NP Reason for referral: Care Coordination ([facility] Diabetes. )   Monica Hunter is a 43 y.o. year old female who is a primary care patient of Early, Sung Amabile, NP.  Monica Hunter was referred to the pharmacist for assistance related to: DMII  Successful contact was made with the patient to discuss pharmacy services.  Patient declines engagement at this time. Contact information was provided to the patient should they wish to reach out for assistance at a later time.  [facility]  [facility], [facility] Health Care Management Assistant Direct Dial: [phone]  Fax: [phone]

## 2023-07-23 ENCOUNTER — Other Ambulatory Visit: Payer: Self-pay

## 2023-07-24 ENCOUNTER — Other Ambulatory Visit: Payer: Self-pay

## 2023-07-25 ENCOUNTER — Other Ambulatory Visit: Payer: Self-pay

## 2023-07-28 ENCOUNTER — Other Ambulatory Visit: Payer: Self-pay

## 2023-07-29 ENCOUNTER — Telehealth: Payer: Self-pay

## 2023-07-29 ENCOUNTER — Other Ambulatory Visit (HOSPITAL_COMMUNITY): Payer: Self-pay

## 2023-07-29 NOTE — Telephone Encounter (Signed)
Pharmacy Patient Advocate Encounter   Received notification from Pt Calls Messages that prior authorization for Monica Hunter is required/requested.   Insurance verification completed.   The patient is insured through [facility] .   Per test claim: PA required; PA submitted to above mentioned insurance via CoverMyMeds Key/confirmation #/EOC [id number] Status is pending

## 2023-07-29 NOTE — Telephone Encounter (Signed)
Mounjaro needs PA

## 2023-07-30 ENCOUNTER — Encounter (HOSPITAL_BASED_OUTPATIENT_CLINIC_OR_DEPARTMENT_OTHER): Payer: Self-pay | Admitting: Physical Therapy

## 2023-07-30 ENCOUNTER — Other Ambulatory Visit (HOSPITAL_COMMUNITY): Payer: Self-pay

## 2023-07-30 MED ORDER — AZELASTINE-FLUTICASONE 137-50 MCG/ACT NA SUSP
1.0000 | Freq: Two times a day (BID) | NASAL | 4 refills | Status: DC
  Filled 2023-07-30: qty 23, 30d supply, fill #0

## 2023-07-30 MED ORDER — MONTELUKAST SODIUM 10 MG PO TABS
10.0000 mg | ORAL_TABLET | Freq: Every day | ORAL | 1 refills | Status: DC
  Filled 2023-07-30: qty 90, 90d supply, fill #0

## 2023-07-30 MED ORDER — DEXCOM G7 SENSOR MISC
3 refills | Status: DC
  Filled 2023-10-10: qty 9, 90d supply, fill #0

## 2023-07-30 MED ORDER — AZELASTINE HCL 0.1 % NA SOLN: 2.0000 | Freq: Two times a day (BID) | NASAL | 5 refills | Status: DC

## 2023-07-30 MED ORDER — FLUTICASONE PROPIONATE 50 MCG/ACT NA SUSP
2.0000 | Freq: Every day | NASAL | 5 refills | Status: DC
  Filled 2023-08-06: qty 16, 30d supply, fill #0

## 2023-07-30 MED ORDER — ALBUTEROL SULFATE HFA 108 (90 BASE) MCG/ACT IN AERS
2.0000 | INHALATION_SPRAY | Freq: Four times a day (QID) | RESPIRATORY_TRACT | 1 refills | Status: DC | PRN
  Filled 2023-07-30: qty 6.7, 16d supply, fill #0

## 2023-07-31 ENCOUNTER — Other Ambulatory Visit (HOSPITAL_COMMUNITY): Payer: Self-pay

## 2023-08-04 ENCOUNTER — Other Ambulatory Visit: Payer: Self-pay

## 2023-08-06 ENCOUNTER — Other Ambulatory Visit (HOSPITAL_COMMUNITY): Payer: Self-pay

## 2023-08-06 ENCOUNTER — Other Ambulatory Visit: Payer: Self-pay

## 2023-08-09 ENCOUNTER — Other Ambulatory Visit: Payer: Self-pay | Admitting: Nurse Practitioner

## 2023-08-09 DIAGNOSIS — K219 Gastro-esophageal reflux disease without esophagitis: Secondary | ICD-10-CM

## 2023-08-11 ENCOUNTER — Other Ambulatory Visit (HOSPITAL_COMMUNITY): Payer: Self-pay

## 2023-08-18 NOTE — Telephone Encounter (Signed)
Pharmacy Patient Advocate Encounter  Received notification from [facility] that Prior Authorization for Monica Hunter has been APPROVED through 06/30/2024   PA #/Case ID/Reference #:  [id number]

## 2023-08-21 ENCOUNTER — Ambulatory Visit: Payer: 59 | Admitting: Allergy

## 2023-08-22 ENCOUNTER — Other Ambulatory Visit: Payer: Self-pay

## 2023-08-22 ENCOUNTER — Ambulatory Visit (INDEPENDENT_AMBULATORY_CARE_PROVIDER_SITE_OTHER): Payer: 59 | Admitting: Allergy

## 2023-08-22 ENCOUNTER — Encounter: Payer: Self-pay | Admitting: Allergy

## 2023-08-22 VITALS — BP 110/78 | HR 87 | Temp 98.0°F | Resp 20 | Ht 66.0 in | Wt 364.3 lb

## 2023-08-22 DIAGNOSIS — L508 Other urticaria: Secondary | ICD-10-CM | POA: Diagnosis not present

## 2023-08-22 DIAGNOSIS — J4540 Moderate persistent asthma, uncomplicated: Secondary | ICD-10-CM

## 2023-08-22 DIAGNOSIS — L2481 Irritant contact dermatitis due to metals: Secondary | ICD-10-CM

## 2023-08-22 DIAGNOSIS — J310 Chronic rhinitis: Secondary | ICD-10-CM

## 2023-08-22 DIAGNOSIS — L309 Dermatitis, unspecified: Secondary | ICD-10-CM

## 2023-08-22 DIAGNOSIS — H1013 Acute atopic conjunctivitis, bilateral: Secondary | ICD-10-CM | POA: Diagnosis not present

## 2023-08-22 DIAGNOSIS — H109 Unspecified conjunctivitis: Secondary | ICD-10-CM

## 2023-08-22 MED ORDER — CROMOLYN SODIUM 4 % OP SOLN: 1.0000 [drp] | Freq: Four times a day (QID) | OPHTHALMIC | 4 refills | Status: DC | PRN

## 2023-08-22 MED ORDER — AZELASTINE-FLUTICASONE 137-50 MCG/ACT NA SUSP
1.0000 | Freq: Two times a day (BID) | NASAL | 4 refills | Status: DC
  Filled 2023-08-22: qty 23, 30d supply, fill #0
  Filled 2023-10-10: qty 23, 30d supply, fill #1

## 2023-08-22 MED ORDER — LEVOCETIRIZINE DIHYDROCHLORIDE 5 MG PO TABS: 5.0000 mg | ORAL_TABLET | Freq: Two times a day (BID) | ORAL | 4 refills | Status: DC

## 2023-08-22 MED ORDER — MONTELUKAST SODIUM 10 MG PO TABS
10.0000 mg | ORAL_TABLET | Freq: Every day | ORAL | 3 refills | Status: DC
Start: 2023-08-22 — End: 2023-11-14
  Filled 2023-08-22: qty 90, 90d supply, fill #0

## 2023-08-22 MED ORDER — OXYMETAZOLINE HCL 0.05 % NA SOLN
NASAL | 0 refills | Status: AC
Start: 2023-08-22 — End: ?

## 2023-08-22 MED ORDER — ALBUTEROL SULFATE (2.5 MG/3ML) 0.083% IN NEBU: 2.5000 mg | INHALATION_SOLUTION | RESPIRATORY_TRACT | 1 refills | Status: AC | PRN

## 2023-08-22 MED ORDER — BREZTRI AEROSPHERE 160-9-4.8 MCG/ACT IN AERO: 2.0000 | INHALATION_SPRAY | Freq: Two times a day (BID) | RESPIRATORY_TRACT | 2 refills | Status: AC

## 2023-08-22 MED ORDER — ALBUTEROL SULFATE HFA 108 (90 BASE) MCG/ACT IN AERS: 2.0000 | INHALATION_SPRAY | Freq: Four times a day (QID) | RESPIRATORY_TRACT | 1 refills | Status: DC | PRN

## 2023-08-22 NOTE — Progress Notes (Signed)
Follow-up Note  RE: Monica Hunter MRN: [id number] DOB: 22-Feb-1981 Date of Office Visit: 08/22/2023   History of present illness: Monica Hunter is a 43 y.o. female presenting today for follow-up of asthma, rhinoconjunctivitis, urticaria, Contact dermatitis.  She was last seen in the office on 05/01/2023 by myself. Discussed the use of AI scribe software for clinical note transcription with the patient, who gave verbal consent to proceed.  She experiences nasal congestion and difficulty breathing through her nose, particularly when stepping outside. Her nasal passages feel 'dried and boogery' without any discharge, and this congestion does not affect her ability to breathe through her mouth.   She uses a yellow inhaler, Breztri, twice daily and a Adderall red inhaler approximately twice a week, along with performing an albuterol nebulizer treatment at least once a day. Her current medications include Singulair, Xyzal, and a nasal spray, Dymista, which she finds effective. She also uses cromolyn eye drops and hydrocortisone for skin rashes as needed. She is on omeprazole for stomach issues and hydroxyzine for itching.  She has a history of hives and environmental allergies, which were previously investigated with lab work ordered a year ago however at the time she was not able to have them drawn. She is not currently experiencing hives but occasionally uses hydrocortisone for skin rashes.  Review of systems: 10pt ROS negative unless noted above in HPI  All other systems negative unless noted above in HPI  Past medical/social/surgical/family history have been reviewed and are unchanged unless specifically indicated below.  No changes  Medication List: Current Outpatient Medications  Medication Sig Dispense Refill   albuterol (PROVENTIL) (2.5 MG/3ML) 0.083% nebulizer solution Take 3 mLs (2.5 mg total) by nebulization every 4 (four) hours as needed for wheezing or shortness of  breath. 75 mL 1   albuterol (VENTOLIN HFA) 108 (90 Base) MCG/ACT inhaler Inhale 2 puffs into the lungs every 6 (six) hours as needed for wheezing or shortness of breath. 18 g 1   albuterol (VENTOLIN HFA) 108 (90 Base) MCG/ACT inhaler Inhale 2 puffs into the lungs every 6 (six) hours as needed for wheezing or shortness of breath. 6.7 g 1   azelastine (ASTELIN) 0.1 % nasal spray Place 2 sprays into both nostrils 2 (two) times daily. Use in each nostril as directed (Patient not taking: Reported on 08/22/2023) 30 mL 5   azelastine (ASTELIN) 0.1 % nasal spray Place 2 sprays into both nostrils 2 (two) times daily. 30 mL 5   Azelastine-Fluticasone 137-50 MCG/ACT SUSP Place 1 spray into both nostrils 2 (two) times daily for congestion or nasal drainage. 23 g 4   bacitracin 500 UNIT/GM ointment Apply 1 Application topically 2 (two) times daily. 15 g 0   Budeson-Glycopyrrol-Formoterol (BREZTRI AEROSPHERE) 160-9-4.8 MCG/ACT AERO Inhale 2 puffs into the lungs in the morning and at bedtime. 10.7 g 4   budesonide-formoterol (SYMBICORT) 160-4.5 MCG/ACT inhaler Inhale 2 puffs into the lungs 2 (two) times daily. 1 each 5   Continuous Glucose Sensor (DEXCOM G7 SENSOR) MISC Apply one sensor to the abdomen every 10 days. 9 each 3   Continuous Glucose Sensor (DEXCOM G7 SENSOR) MISC Change and apply one sensor to the abdomen every 10 days. 9 each 3   cromolyn (OPTICROM) 4 % ophthalmic solution Place 1 drop into both eyes 4 (four) times daily as needed (itchy/watery eyes). 10 mL 4   diclofenac (VOLTAREN) 50 MG EC tablet Take 1 tablet (50 mg total) by mouth as needed. 30 tablet 6  fluconazole (DIFLUCAN) 150 MG tablet Take one tablet today, and one in 3 days. Repeat in 2 weeks if symptoms return. 2 tablet 3   fluoruracil (CARAC) 0.5 % cream Apply topically daily. Apply 1 drop of salicylic acid with pea sized amount of fluorouracil to the top of the wart only daily using q-tip. Allow to dry and cover with bandage. 30 g 0    fluticasone (FLONASE) 50 MCG/ACT nasal spray Place 2 sprays into both nostrils daily. 16 g 4   fluticasone (FLONASE) 50 MCG/ACT nasal spray Place 2 sprays into both nostrils daily. 16 g 5   hydrOXYzine (ATARAX) 25 MG tablet TAKE 1 TABLET BY MOUTH EVERY 8 HOURS AS NEEDED. 270 tablet 2   insulin glargine (LANTUS) 100 unit/mL SOPN Inject 20 Units into the skin daily. 30 mL 3   Insulin Pen Needle 32G X 4 MM MISC 1 Device daily in the afternoon. 100 each 3   levocetirizine (XYZAL) 5 MG tablet Take 1 tablet (5 mg total) by mouth 2 (two) times daily. 60 tablet 4   montelukast (SINGULAIR) 10 MG tablet Take 1 tablet (10 mg total) by mouth at bedtime. 90 tablet 1   montelukast (SINGULAIR) 10 MG tablet Take 1 tablet (10 mg total) by mouth at bedtime. 90 tablet 1   norethindrone (MICRONOR) 0.35 MG tablet Take 1 tablet (0.35 mg total) by mouth daily. 84 tablet 1   nystatin ointment (MYCOSTATIN) Apply 1 Application topically 2 (two) times daily. 30 g 6   ondansetron (ZOFRAN-ODT) 4 MG disintegrating tablet Take 1 tablet (4 mg total) by mouth every 8 (eight) hours as needed for nausea or vomiting. 30 tablet 3   oxymetazoline (AFRIN) 0.05 % nasal spray 2 sprays each nostril twice a day for next 3 days.  Wait until you can breathe through nose then use Dymista 30 mL 0   pantoprazole (PROTONIX) 40 MG tablet TAKE 1 TABLET BY MOUTH EVERY DAY 90 tablet 1   Salicylic Acid (SALICYLIC ACID WART REMOVER) 27.5 % LIQD Apply 1 drop of salicylic acid with pea sized amount of fluorouracil to the top of the wart only daily using q-tip. Allow to dry and cover with bandage. 10 mL 1   Spacer/Aero-Holding Chambers (AEROCHAMBER PLUS WITH MASK) inhaler Use as directed with Metered Dose Inhaler 1 each 1   SUMAtriptan (IMITREX) 100 MG tablet Take 1 tablet (100 mg total) by mouth as needed for migraine. May repeat in 2 hours if headache persists or recurs. 10 tablet 0   tirzepatide (MOUNJARO) 5 MG/0.5ML Pen Inject 5 mg into the skin once a  week. 6 mL 3   triamcinolone cream (KENALOG) 0.1 % Apply 1 application. topically 2 (two) times daily. To affected area(s) as needed 30 g 3   valACYclovir (VALTREX) 1000 MG tablet Take 1 tablet (1,000 mg total) by mouth daily. 30 tablet 6   Vitamins A & D (VITAMIN A & D) ointment Apply 1 application. topically as needed for dry skin. Apply twice a day to rectum and vaginal area with nystatin cream. 45 g 3   Azelastine-Fluticasone (DYMISTA) 137-50 MCG/ACT SUSP 1 spray each nostril twice a day for congestion or nasal drainage. (Patient not taking: Reported on 08/22/2023) 23 g 4   No current facility-administered medications for this visit.     Known medication allergies: Allergies  Allergen Reactions   Ceftriaxone Hives and Itching   Methocarbamol Itching and Rash   Sulfa Antibiotics Other (See Comments), Hives, Itching and Rash  Reaction unknown     Physical examination: Blood pressure 110/78, pulse 87, temperature 98 F (36.7 C), temperature source Temporal, resp. rate 20, height 5\' 6"  (1.676 m), weight (!) 364 lb 4.8 oz (165.2 kg), SpO2 96%.  General: Alert, interactive, in no acute distress. HEENT: PERRLA, TMs pearly gray, turbinates markedly edematous with crusty discharge, post-pharynx non erythematous. Neck: Supple without lymphadenopathy. Lungs: Clear to auscultation without wheezing, rhonchi or rales. {no increased work of breathing. CV: Normal S1, S2 without murmurs. Abdomen: Nondistended, nontender. Skin: Warm and dry, without lesions or rashes. Extremities:  No clubbing, cyanosis or edema. Neuro:   Grossly intact.  Diagnositics/Labs:  Spirometry: FEV1: 1.72 L or 59%, FVC: 2.8 L 78% predicted.  This shows obstructive pattern but is an improved study from her previous study a year ago  Assessment and plan: Rhinoconjunctivitis - will obtain environmental allergy panel - use Xyzal (levocetirizine) 5mg  1-2 tabs daily depending on symptom severity - for itchy/watery eyes  use Cromolyn 1 drop each eye up to 4 times a day as needed - for severe nasal congestion right now use Afrin 2 sprays each nostril twice a day for next 3 days.  After use wait 5-15 minutes or until you can breathe more freely through the nose and then go in with your Dymista.   After 3 days continue Dymista use.  - for nasal congestion/drainage use Dymista 1 sprays each nostril twice a day for congestion or nasal drainage.    Urticaria - at this time etiology of hives and itching is spontaneous.  Hives can be caused by a variety of different triggers including illness/infection, foods, medications, stings, exercise, pressure, vibrations, extremes of temperature to name a few however majority of the time there is no identifiable trigger.   - if having hives take: Xyzal 5mg  1 tab twice a day with Pepcid 20mg  1 tab twice a day.   Keep your Singulair daily.  - consider Xolair monthly injections if above regimen is not effective enough.   - can use your hydroxyzine as needed for breakthrough symptoms.  Asthma, moderate persistent - Daily controller medication(s): Singulair 10mg  daily and Breztri (yellow/gold inhaler) two puffs twice daily with spacer - Prior to physical activity: albuterol 2 puffs 10-15 minutes before physical activity. - Rescue medications: albuterol 2 puffs or albuterol 1 vial via nebulizer every 4-6 hours as needed - Xolair may also help with your asthma control if there is an allergic component.   - Will obtain CBC to assess your eosinophil count to help guide further asthma control options - Lung function testing much better than previous years  - Asthma control goals:  * Full participation in all desired activities (may need albuterol before activity) * Albuterol use two time or less a week on average (not counting use with activity) * Cough interfering with sleep two time or less a month * Oral steroids no more than once a year * No hospitalizations  Contact dermatitis -  avoid jewelry that cause rash.  - continue Hydrocortisone twice a day to neck area until resolved - keep skin moisturized.    Follow-up in 4-6 months or sooner if needed  I appreciate the opportunity to take part in Daana's care. Please do not hesitate to contact me with questions.  Sincerely,   Margo Aye, MD Allergy/Immunology [facility]

## 2023-08-22 NOTE — Patient Instructions (Addendum)
-   will obtain environmental allergy panel - use Xyzal (levocetirizine) 5mg  1-2 tabs daily depending on symptom severity - for itchy/watery eyes use Cromolyn 1 drop each eye up to 4 times a day as needed - for severe nasal congestion right now use Afrin 2 sprays each nostril twice a day for next 3 days.  After use wait 5-15 minutes or until you can breathe more freely through the nose and then go in with your Dymista.   After 3 days continue Dymista use.  - for nasal congestion/drainage use Dymista 1 sprays each nostril twice a day for congestion or nasal drainage.    - at this time etiology of hives and itching is spontaneous.  Hives can be caused by a variety of different triggers including illness/infection, foods, medications, stings, exercise, pressure, vibrations, extremes of temperature to name a few however majority of the time there is no identifiable trigger.   - if having hives take: Xyzal 5mg  1 tab twice a day with Pepcid 20mg  1 tab twice a day.   Keep your Singulair daily.  - consider Xolair monthly injections if above regimen is not effective enough.   - can use your hydroxyzine as needed for breakthrough symptoms.  - Daily controller medication(s): Singulair 10mg  daily and Breztri (yellow/gold inhaler) two puffs twice daily with spacer - Prior to physical activity: albuterol 2 puffs 10-15 minutes before physical activity. - Rescue medications: albuterol 2 puffs or albuterol 1 vial via nebulizer every 4-6 hours as needed - Xolair may also help with your asthma control if there is an allergic component.   - Will obtain CBC to assess your eosinophil count to help guide further asthma control options - Lung function testing much better than previous years  - Asthma control goals:  * Full participation in all desired activities (may need albuterol before activity) * Albuterol use two time or less a week on average (not counting use with activity) * Cough interfering with sleep two time  or less a month * Oral steroids no more than once a year * No hospitalizations  - avoid jewelry that cause rash.  - continue Hydrocortisone twice a day to neck area until resolved - keep skin moisturized.    Follow-up in 4-6 months or sooner if needed

## 2023-08-26 LAB — CBC WITH DIFFERENTIAL/PLATELET
Basophils Absolute: 0 x10E3/uL (ref 0.0–0.2)
Basos: 0 %
EOS (ABSOLUTE): 0.1 x10E3/uL (ref 0.0–0.4)
Eos: 2 %
Hematocrit: 44.4 % (ref 34.0–46.6)
Hemoglobin: 14.6 g/dL (ref 11.1–15.9)
Immature Grans (Abs): 0 x10E3/uL (ref 0.0–0.1)
Immature Granulocytes: 0 %
Lymphocytes Absolute: 2.8 x10E3/uL (ref 0.7–3.1)
Lymphs: 41 %
MCH: 30.2 pg (ref 26.6–33.0)
MCHC: 32.9 g/dL (ref 31.5–35.7)
MCV: 92 fL (ref 79–97)
Monocytes Absolute: 0.4 x10E3/uL (ref 0.1–0.9)
Monocytes: 6 %
Neutrophils Absolute: 3.4 x10E3/uL (ref 1.4–7.0)
Neutrophils: 51 %
Platelets: 299 x10E3/uL (ref 150–450)
RBC: 4.84 x10E6/uL (ref 3.77–5.28)
RDW: 12.4 % (ref 11.7–15.4)
WBC: 6.9 x10E3/uL (ref 3.4–10.8)

## 2023-08-26 LAB — ALLERGENS W/TOTAL IGE AREA 2
Alternaria Alternata IgE: 0.77 kU/L — AB
Cat Dander IgE: 0.28 kU/L — AB
Cladosporium Herbarum IgE: 0.95 kU/L — AB
Cockroach, German IgE: 12.8 kU/L — AB
D Farinae IgE: 0.62 kU/L — AB
D Pteronyssinus IgE: 0.64 kU/L — AB
Dog Dander IgE: 1.4 kU/L — AB
IgE (Immunoglobulin E), Serum: 558 [IU]/mL — ABNORMAL HIGH (ref 6–495)
Pecan, Hickory IgE: 0.12 kU/L — AB
Penicillium Chrysogen IgE: 0.6 kU/L — AB
Timothy Grass IgE: 0.11 kU/L — AB

## 2023-08-27 ENCOUNTER — Encounter: Payer: Self-pay | Admitting: Allergy

## 2023-08-28 ENCOUNTER — Encounter (HOSPITAL_BASED_OUTPATIENT_CLINIC_OR_DEPARTMENT_OTHER): Payer: Self-pay | Admitting: Physical Therapy

## 2023-08-28 ENCOUNTER — Ambulatory Visit (HOSPITAL_BASED_OUTPATIENT_CLINIC_OR_DEPARTMENT_OTHER): Payer: 59 | Attending: Advanced Practice Midwife | Admitting: Physical Therapy

## 2023-08-28 DIAGNOSIS — M5459 Other low back pain: Secondary | ICD-10-CM | POA: Diagnosis not present

## 2023-08-28 DIAGNOSIS — M6281 Muscle weakness (generalized): Secondary | ICD-10-CM | POA: Insufficient documentation

## 2023-08-28 DIAGNOSIS — G8929 Other chronic pain: Secondary | ICD-10-CM | POA: Insufficient documentation

## 2023-08-28 DIAGNOSIS — M25562 Pain in left knee: Secondary | ICD-10-CM | POA: Insufficient documentation
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
## 2023-09-01 ENCOUNTER — Other Ambulatory Visit (HOSPITAL_COMMUNITY): Payer: Self-pay

## 2023-09-05 ENCOUNTER — Encounter (HOSPITAL_BASED_OUTPATIENT_CLINIC_OR_DEPARTMENT_OTHER): Payer: Self-pay | Admitting: Physical Therapy

## 2023-09-05 ENCOUNTER — Ambulatory Visit (HOSPITAL_BASED_OUTPATIENT_CLINIC_OR_DEPARTMENT_OTHER): Payer: 59 | Attending: Advanced Practice Midwife | Admitting: Physical Therapy

## 2023-09-05 DIAGNOSIS — M6281 Muscle weakness (generalized): Secondary | ICD-10-CM | POA: Diagnosis not present

## 2023-09-05 DIAGNOSIS — G8929 Other chronic pain: Secondary | ICD-10-CM | POA: Diagnosis not present

## 2023-09-05 DIAGNOSIS — M5459 Other low back pain: Secondary | ICD-10-CM | POA: Diagnosis not present

## 2023-09-05 DIAGNOSIS — M25561 Pain in right knee: Secondary | ICD-10-CM | POA: Diagnosis not present

## 2023-09-05 DIAGNOSIS — M25562 Pain in left knee: Secondary | ICD-10-CM | POA: Diagnosis not present

## 2023-09-05 NOTE — Therapy (Signed)
 OUTPATIENT PHYSICAL THERAPY THORACOLUMBAR TREATMENT RE-Cert   Patient Name: Monica Hunter MRN: [id number] DOB:[birth year], 43 y.o., female Today's Date: 09/05/2023  END OF SESSION:  PT End of Session - 09/05/23 1149     Visit Number 11    Number of Visits 15    Date for PT Re-Evaluation 09/26/23    Authorization Type uhc    Authorization - Number of Visits 16    PT Start Time 1148    PT Stop Time 1230    PT Time Calculation (min) 42 min    Activity Tolerance Patient tolerated treatment well    Behavior During Therapy WFL for tasks assessed/performed                 Past Medical History:  Diagnosis Date   Asthma    Candidal dermatitis 11/04/2021   Cornea abrasion    Diabetes mellitus    Migraines    Morbid obesity (HCC)    Past Surgical History:  Procedure Laterality Date   CERVICAL POLYPECTOMY     DILATION AND CURETTAGE OF UTERUS     Patient Active Problem List   Diagnosis Date Noted   Type 2 diabetes mellitus without complication, with long-term current use of insulin (HCC) 07/17/2023   Vision changes 05/25/2023   Decreased hearing of both ears 05/25/2023   Osteoarthritis 05/25/2023   Allergic rhinitis 05/25/2023   Bilateral chronic knee pain 05/15/2023   Abscess 02/12/2023   Genetic testing 01/06/2023   Bilateral occipital neuralgia 08/13/2022   Chronic bilateral low back pain without sciatica 03/17/2022   Overflow incontinence of urine 12/03/2021   Type 2 diabetes mellitus with diabetic dermatitis, with long-term current use of insulin (HCC) 11/04/2021   Oligomenorrhea 11/04/2021   Gastroesophageal reflux disease 11/04/2021   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   Asthma 02/14/2018   Class 3 severe obesity due to excess calories without serious comorbidity with body mass index (BMI) of 50.0 to 59.9 in adult (HCC) 02/14/2018   Hidradenitis suppurativa 02/14/2018    PCP: Les Pou Early NP   REFERRING PROVIDER: Estill Bamberg Leftwitch   REFERRING DIAG:   Diagnosis  M54.50,G89.29 (ICD-10-CM) - Chronic midline low back pain without sciatica    Rationale for Evaluation and Treatment: Rehabilitation  THERAPY DIAG:  Other low back pain  Chronic pain of left knee  ONSET DATE: Many years   SUBJECTIVE:                                                                                                                                                                                           SUBJECTIVE STATEMENT: "Hips are hurting today"  FROM INITIAL EVAL: Patient has a long history of low back pain.  She reports she has had lower back pain since she was a teenager.  Over the past few years she has developed significant bilateral knee pain as well.  Per patient her her general mobility is decreased.  She enjoyed walking for exercise prior but she is unable to now at this time.  She lives on the second floor and has to go up the stairs sideways.  She feels like when she is walking downstairs her knees may buckle.  PERTINENT HISTORY:  DM II, Migraines; Morbid obesity;   PAIN:  Are you having pain? Yes: NPRS scale: 7/10 Pain location: back Pain description: aching  Aggravating factors: mornings  Relieving factors: lay around    PRECAUTIONS: None   RED FLAGS: None   WEIGHT BEARING RESTRICTIONS: No  FALLS:  Has patient fallen in last 6 months? Larey Seat througha deck   LIVING ENVIRONMENT:  Stairs into the house OCCUPATION:  home care   Hobbies:  Like to get out to walk   PLOF: Independent  PATIENT GOALS:  To have less pain/ To be able to walk longer  NEXT MD VISIT:  Nothing scheduled   OBJECTIVE:  Note: Objective measures were completed at Evaluation unless otherwise noted.  DIAGNOSTIC FINDINGS:  Lumbar MRI  IMPRESSION: 1. Mild lower lumbar spondylosis as described above. Mild-to-moderate bilateral neuroforaminal stenosis at L5-S1.    PATIENT SURVEYS:  Unable to provide secondary to time  SCREENING FOR RED  FLAGS: Bowel or bladder incontinence: No Spinal tumors: No Cauda equina syndrome: No Compression fracture: No Abdominal aneurysm: No  COGNITION: Overall cognitive status: Within functional limits for tasks assessed     SENSATION: Good sensation     POSTURE: rounded shoulders, forward head, and flexed trunk   PALPATION: Tenderness palpation bilateral lower lumbar paraspinals from L3-S1    LUMBAR ROM:   AROM eval 07/08/23 08/28/23  Flexion Pulling at end range full Full P! Returning to upright  Extension Significant pain Wfl pain end range full  Right lateral flexion     Left lateral flexion     Right rotation Limited 25% with pulling Limited 50% pulling full  Left rotation Limited 25% with pulling Limited 50% pulling full   (Blank rows = not tested)  LOWER EXTREMITY ROM:     Active  Right eval Left eval  Hip flexion    Hip extension    Hip abduction    Hip adduction    Hip internal rotation    Hip external rotation    Knee flexion    Knee extension    Ankle dorsiflexion    Ankle plantarflexion    Ankle inversion    Ankle eversion     (Blank rows = not tested)  LOWER EXTREMITY MMT:    MMT Right eval Left eval Right / Left  07/08/23 R / L 08/28/23  Hip flexion 28.1 27.1 45.9 / 40 56.5 / 55.8  Hip extension      Hip abduction 22.1 30.1 45.9 / 48.7 57.7 / 49.5  Hip adduction      Hip internal rotation      Hip external rotation      Knee flexion      Knee extension 18.1 24.5  37.1 / 43.1  Ankle dorsiflexion      Ankle plantarflexion      Ankle inversion      Ankle eversion       (Blank rows =  not tested)   FUNCTIONAL TESTS:  Increased pain transferring from sit to stand  GAIT:  Significant lateral movement.  Decreased single-leg stance time on right   TODAY'S TREATMENT  07/28/23 Re-assess completed Self care: instruction on continuity of care importance; compliance of HEP; management of chronic condition  Pt seen for aquatic therapy today.   Treatment took place in water 3.5-4.75 ft in depth at the [facility] pool. Temp of water was 91.  Pt entered/exited the pool via stairs independently with bilat rail.   Exercises - Hand Buoy Carry  - Side Stepping  - Standing 'L' Stretch at [facility]   - Squat  - Standing Hip Flexion Extension at [employer]  - Standing March at [facility]  - Noodle press   - Drawing Bow   - Seated Straddle on [employer] Breast Stroke Arms and Bicycle Legs  - Standing Hip Flexion Extension   - Standing Hip Abduction      PATIENT EDUCATION:  Education details: HEP, symptom management, progression of activities, importance of cardiovascular activity. Person educated: Patient Education method: Explanation, Demonstration, Tactile cues, Verbal cues, and Handouts Education comprehension: verbalized understanding, returned demonstration, verbal cues required, tactile cues required, and needs further education  HOME EXERCISE PROGRAM: Access Code: [email or web address] URL: [email or web address] Date: 05/14/2023 Prepared by: Riki Altes  Exercises - Seated Flexion Stretch  - 2 x daily - 7 x weekly - 5 sets - 10-15 seconds hold - Standing 3-Way Leg Reach with Resistance at Ankles and Counter Support  - 2 x daily - 7 x weekly - 2 sets - 10 reps - Clamshell  - 2 x daily - 7 x weekly - 3 sets - 10 reps - Seated March  - 1 x daily - 7 x weekly - 3 sets - 10 reps - Seated Long Arc Quad  - 1 x daily - 7 x weekly - 3 sets - 10 reps - 5 seconds hold   Aquatic This aquatic home exercise program from [employer] utilizes pictures from land based exercises, but has been adapted prior to lamination and issuance.    Access Code: [email or web address] URL: [email or web address] Date: 07/08/2023 Prepared by: Geni Bers Updated 08/27/26 This aquatic home exercise program from [employer] utilizes pictures from land based exercises, but has been adapted prior to lamination and issuance.   Issued  Exercises - Hand Buoy Carry  - 1 x daily - 1-3 x weekly - Side Stepping  - 1 x daily - 1-3 x weekly - Standing 'L' Stretch at [employer]  - 1 x daily - 1-3 x weekly - 1-2 sets - 3 reps - Squat  - 1 x daily - 1-3 x weekly - 1-2 sets - 10 reps - Standing Hip Flexion Extension at [employer]  - 1 x daily - 1-3 x weekly - 1-2 sets - 10 reps - Standing March at [facility]  - 1 x daily - 1-3 x weekly - 1-2 sets - 10 reps - Noodle press  - 1 x daily - 1-3 x weekly - 1-2 sets - 10 reps - Drawing Bow  - 1 x daily - 1-3 x weekly - 1-2 sets - 10 reps - Seated Straddle on [employer] Breast Stroke Arms and Bicycle Legs  - 1 x daily - 1-3 x weekly - Standing Hip Flexion Extension  - 1 x daily - 1-3 x weekly - 1-2 sets - 10 reps - Standing Hip Abduction  - 1 x daily - 1-3 x  weekly - 1-2 sets - 10 reps   ASSESSMENT:  CLINICAL IMPRESSION: Pt instructed through final aquatic hep.  She is issued laminated copy. Requires verbal cues and minor written clarifications. She is indep with program and ready to completely transition to land based intervention. Her pain sensitivity is unchanged. Goals ongoign   2/27 PN Pt has not returned since last re-cert /PN.  She returns today with complaints of  lbp. She has moved and is now residing on bottom floor, no stairs to climb. Pt edu on importance of consistency of care/compliance with HEP for long term management of her chronic conditions. She continues to demonstrate improvements in strength and ROM as noted above. Plan is similar: 1 more aquatic session to finish instructing on final HEP then return to land based for 3-4 visits for final HEP. Final aquatic HEP created and instruction begun.  She tolerates very well.  Will plan on issuing next session.   1/7 PN Pt arrives late for session. She has completed approx half of initially anticipated treatments. Muscle testing demonstrates good improvement meeting/exceeding goal. Aquatic HEP initiated. Pt has  land based HEP. Her pain goal has been met but she continues to have pain sensitivity that waxes and wanes. Lumbar ROM improved. She will continue to benefit from a few more sessions. Plan on 2 more aquatic to finish instructing on final aquatic HEP then return to land based.    OBJECTIVE IMPAIRMENTS: Abnormal gait, decreased activity tolerance, decreased endurance, decreased mobility, difficulty walking, decreased ROM, decreased strength, postural dysfunction, obesity, and pain.   ACTIVITY LIMITATIONS: carrying, lifting, bending, sitting, standing, squatting, stairs, transfers, and locomotion level  PARTICIPATION LIMITATIONS: meal prep, cleaning, laundry, driving, shopping, and community activity  PERSONAL FACTORS: 3+ comorbidities: multi joint OA, DMII Migraines  are also affecting patient's functional outcome.   REHAB POTENTIAL: Excellent  CLINICAL DECISION MAKING: Evolving/moderate complexity  EVALUATION COMPLEXITY: Moderate   GOALS: Goals reviewed with patient? Yes  SHORT TERM GOALS: Target date: 06/05/2023  Patient will increase gross bilateral LE strength by 5 lbs  Baseline: Goal status: Continue to build HEP for strengthening 11/21; MET 07/08/23  2.  Patient wil be independent with basic HEP for land and aquatics  Baseline:  Goal status: Continue to build program 11/21; in progress 07/07/22; 08/28/23  3.  Patient will report a 25% reduction in pain in her knees and back  Baseline:  Goal status: 50% reduction - 06/30/23   LONG TERM GOALS: Target date: 09/26/23    Patient will have a complete program that includes cardiovascular activity and an aquatic program  Baseline: currently no access to pool/gym Goal status: In PROGRESS - 06/30/23; 08/28/23 (met in aquatics 09/05/23)  2.  Patient will go up and down 8 steps with reciprocal gait pattern with less than 2 out of 10 pain Baseline:  Goal status: Has not attempted due to fear of falling- 06/30/23  3.  Patient will walk  community distances for exercise without increased pain Baseline:  Goal status: Ongoing 08/28/23  PLAN:  PT FREQUENCY: 1xw  PT DURATION: 5w  PLANNED INTERVENTIONS: Therapeutic exercises, Therapeutic activity, Neuromuscular re-education, Balance training, Gait training, Patient/Family education, Self Care, Joint mobilization, Stair training, DME instructions, Aquatic Therapy, Dry Needling, Electrical stimulation, Cryotherapy, Moist heat, Taping, Manual therapy, and Re-evaluation. Marland Kitchen  PLAN FOR NEXT SESSION:   Land: continue progressive strengthening for hips/knees/core as tolerated.  Update HEP.   Aquatic: Final HEP  Corrie Dandy Tomma Lightning) Naftuli Dalsanto MPT 09/05/23 12:27 PM [facility] [address] [place], [place], [address] Phone: [phone]   Fax:  [phone]

## 2023-09-11 ENCOUNTER — Ambulatory Visit (HOSPITAL_BASED_OUTPATIENT_CLINIC_OR_DEPARTMENT_OTHER)

## 2023-09-11 ENCOUNTER — Encounter (HOSPITAL_BASED_OUTPATIENT_CLINIC_OR_DEPARTMENT_OTHER): Payer: Self-pay

## 2023-09-11 DIAGNOSIS — G8929 Other chronic pain: Secondary | ICD-10-CM | POA: Diagnosis not present

## 2023-09-11 DIAGNOSIS — M25561 Pain in right knee: Secondary | ICD-10-CM | POA: Diagnosis not present

## 2023-09-11 DIAGNOSIS — M6281 Muscle weakness (generalized): Secondary | ICD-10-CM | POA: Diagnosis not present

## 2023-09-11 DIAGNOSIS — M5459 Other low back pain: Secondary | ICD-10-CM | POA: Diagnosis not present

## 2023-09-11 DIAGNOSIS — M25562 Pain in left knee: Secondary | ICD-10-CM | POA: Diagnosis not present

## 2023-09-11 NOTE — Therapy (Signed)
 OUTPATIENT PHYSICAL THERAPY THORACOLUMBAR TREATMENT   Patient Name: Monica Hunter MRN: [id number] DOB:1981/06/07, 43 y.o., female Today's Date: 09/11/2023  END OF SESSION:  PT End of Session - 09/11/23 1025     Visit Number 12    Number of Visits 15    Date for PT Re-Evaluation 09/26/23    Authorization Type uhc    Authorization Time Period --    Authorization - Number of Visits --    PT Start Time 0845    PT Stop Time 0930    PT Time Calculation (min) 45 min    Activity Tolerance Patient tolerated treatment well    Behavior During Therapy [facility] for tasks assessed/performed                  Past Medical History:  Diagnosis Date   Asthma    Candidal dermatitis 11/04/2021   Cornea abrasion    Diabetes mellitus    Migraines    Morbid obesity (HCC)    Past Surgical History:  Procedure Laterality Date   CERVICAL POLYPECTOMY     DILATION AND CURETTAGE OF UTERUS     Patient Active Problem List   Diagnosis Date Noted   Type 2 diabetes mellitus without complication, with long-term current use of insulin (HCC) 07/17/2023   Vision changes 05/25/2023   Decreased hearing of both ears 05/25/2023   Osteoarthritis 05/25/2023   Allergic rhinitis 05/25/2023   Bilateral chronic knee pain 05/15/2023   Abscess 02/12/2023   Genetic testing 01/06/2023   Bilateral occipital neuralgia 08/13/2022   Chronic bilateral low back pain without sciatica 03/17/2022   Overflow incontinence of urine 12/03/2021   Type 2 diabetes mellitus with diabetic dermatitis, with long-term current use of insulin (HCC) 11/04/2021   Oligomenorrhea 11/04/2021   Gastroesophageal reflux disease 11/04/2021   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   Asthma 02/14/2018   Class 3 severe obesity due to excess calories without serious comorbidity with body mass index (BMI) of 50.0 to 59.9 in adult (HCC) 02/14/2018   Hidradenitis suppurativa 02/14/2018    PCP: Les Pou Early NP   REFERRING PROVIDER: Estill Bamberg  Leftwitch   REFERRING DIAG:  Diagnosis  M54.50,G89.29 (ICD-10-CM) - Chronic midline low back pain without sciatica    Rationale for Evaluation and Treatment: Rehabilitation  THERAPY DIAG:  Other low back pain  Chronic pain of left knee  Muscle weakness (generalized)  Chronic pain of right knee  ONSET DATE: Many years   SUBJECTIVE:  SUBJECTIVE STATEMENT: Knee pain has improved, back still hurts. 3/10 pain level in back this morning. Usually more painful by end of the day.      FROM INITIAL EVAL: Patient has a long history of low back pain.  She reports she has had lower back pain since she was a teenager.  Over the past few years she has developed significant bilateral knee pain as well.  Per patient her her general mobility is decreased.  She enjoyed walking for exercise prior but she is unable to now at this time.  She lives on the second floor and has to go up the stairs sideways.  She feels like when she is walking downstairs her knees may buckle.  PERTINENT HISTORY:  DM II, Migraines; Morbid obesity;   PAIN:  Are you having pain? Yes: NPRS scale: 7/10 Pain location: back Pain description: aching  Aggravating factors: mornings  Relieving factors: lay around    PRECAUTIONS: None   RED FLAGS: None   WEIGHT BEARING RESTRICTIONS: No  FALLS:  Has patient fallen in last 6 months? Larey Seat througha deck   LIVING ENVIRONMENT:  Stairs into the house OCCUPATION:  home care   Hobbies:  Like to get out to walk   PLOF: Independent  PATIENT GOALS:  To have less pain/ To be able to walk longer  NEXT MD VISIT:  Nothing scheduled   OBJECTIVE:  Note: Objective measures were completed at Evaluation unless otherwise noted.  DIAGNOSTIC FINDINGS:  Lumbar MRI  IMPRESSION: 1. Mild lower  lumbar spondylosis as described above. Mild-to-moderate bilateral neuroforaminal stenosis at L5-S1.    PATIENT SURVEYS:  Unable to provide secondary to time  SCREENING FOR RED FLAGS: Bowel or bladder incontinence: No Spinal tumors: No Cauda equina syndrome: No Compression fracture: No Abdominal aneurysm: No  COGNITION: Overall cognitive status: Within functional limits for tasks assessed     SENSATION: Good sensation     POSTURE: rounded shoulders, forward head, and flexed trunk   PALPATION: Tenderness palpation bilateral lower lumbar paraspinals from L3-S1    LUMBAR ROM:   AROM eval 07/08/23 08/28/23  Flexion Pulling at end range full Full P! Returning to upright  Extension Significant pain Wfl pain end range full  Right lateral flexion     Left lateral flexion     Right rotation Limited 25% with pulling Limited 50% pulling full  Left rotation Limited 25% with pulling Limited 50% pulling full   (Blank rows = not tested)  LOWER EXTREMITY ROM:     Active  Right eval Left eval  Hip flexion    Hip extension    Hip abduction    Hip adduction    Hip internal rotation    Hip external rotation    Knee flexion    Knee extension    Ankle dorsiflexion    Ankle plantarflexion    Ankle inversion    Ankle eversion     (Blank rows = not tested)  LOWER EXTREMITY MMT:    MMT Right eval Left eval Right / Left  07/08/23 R / L 08/28/23  Hip flexion 28.1 27.1 45.9 / 40 56.5 / 55.8  Hip extension      Hip abduction 22.1 30.1 45.9 / 48.7 57.7 / 49.5  Hip adduction      Hip internal rotation      Hip external rotation      Knee flexion      Knee extension 18.1 24.5  37.1 / 43.1  Ankle dorsiflexion  Ankle plantarflexion      Ankle inversion      Ankle eversion       (Blank rows = not tested)   FUNCTIONAL TESTS:  Increased pain transferring from sit to stand  GAIT:  Significant lateral movement.  Decreased single-leg stance time on right   TODAY'S  TREATMENT   3/13  Nu-step 6 min L3 Seated ball flexion forward 5 x 10-second hold y  lateral 5 x 10-second holds Seated hip adduction squeeze 5" x20 Seated clam GTB 2x30 Standing hip extension 2x10 Standing lateral flexion stretch L stretch at back of bike 10 sec x4 Sit to stands from elevated plinth 2x10 Heel raise 3x10  Seated March 2x10   Row red 2x10  Shoulder extension red  2x10   HEP review and printout/login info provided       07/28/23 Re-assess completed Self care: instruction on continuity of care importance; compliance of HEP; management of chronic condition  Pt seen for aquatic therapy today.  Treatment took place in water 3.5-4.75 ft in depth at the [facility] pool. Temp of water was 91.  Pt entered/exited the pool via stairs independently with bilat rail.   Exercises - Hand Buoy Carry  - Side Stepping  - Standing 'L' Stretch at [facility]   - Squat  - Standing Hip Flexion Extension at [employer]  - Standing March at [facility]  - Noodle press   - Drawing Bow   - Seated Straddle on [employer] Breast Stroke Arms and Bicycle Legs  - Standing Hip Flexion Extension   - Standing Hip Abduction      PATIENT EDUCATION:  Education details: HEP, symptom management, progression of activities, importance of cardiovascular activity. Person educated: Patient Education method: Explanation, Demonstration, Tactile cues, Verbal cues, and Handouts Education comprehension: verbalized understanding, returned demonstration, verbal cues required, tactile cues required, and needs further education  HOME EXERCISE PROGRAM: Access Code: [email or web address] URL: [email or web address] Date: 05/14/2023 Prepared by: Riki Altes  Exercises - Seated Flexion Stretch  - 2 x daily - 7 x weekly - 5 sets - 10-15 seconds hold - Standing 3-Way Leg Reach with Resistance at Ankles and Counter Support  - 2 x daily - 7 x weekly - 2 sets - 10 reps - Clamshell  - 2 x  daily - 7 x weekly - 3 sets - 10 reps - Seated March  - 1 x daily - 7 x weekly - 3 sets - 10 reps - Seated Long Arc Quad  - 1 x daily - 7 x weekly - 3 sets - 10 reps - 5 seconds hold   Aquatic This aquatic home exercise program from [employer] utilizes pictures from land based exercises, but has been adapted prior to lamination and issuance.    Access Code: [email or web address] URL: [email or web address] Date: 07/08/2023 Prepared by: Geni Bers Updated 08/27/26 This aquatic home exercise program from [employer] utilizes pictures from land based exercises, but has been adapted prior to lamination and issuance.  Issued  Exercises - Hand Buoy Carry  - 1 x daily - 1-3 x weekly - Side Stepping  - 1 x daily - 1-3 x weekly - Standing 'L' Stretch at [employer]  - 1 x daily - 1-3 x weekly - 1-2 sets - 3 reps - Squat  - 1 x daily - 1-3 x weekly - 1-2 sets - 10 reps - Standing Hip Flexion Extension at [employer]  - 1 x daily - 1-3 x weekly - 1-2 sets -  10 reps - Standing March at [facility]  - 1 x daily - 1-3 x weekly - 1-2 sets - 10 reps - Noodle press  - 1 x daily - 1-3 x weekly - 1-2 sets - 10 reps - Drawing Bow  - 1 x daily - 1-3 x weekly - 1-2 sets - 10 reps - Seated Straddle on [employer]ot[employer] Forward Breast Stroke Arms and Bicycle Legs  - 1 x daily - 1-3 x weekly - Standing Hip Flexion Extension  - 1 x daily - 1-3 x weekly - 1-2 sets - 10 reps - Standing Hip Abduction  - 1 x daily - 1-3 x weekly - 1-2 sets - 10 reps   ASSESSMENT:  CLINICAL IMPRESSION: Pt with good tolerance overall for resumption of land based treatment. Performed intermittent lumbar stretching between seated and standing tasks. She reported general muscular fatigue with exercises, but largely denied increased pain. Pt was provided with additional printout of HEP and instructions for app use. Will continue to build on independent program for pt's remaining 2 visits of PT. Pt reports she will try to find access to a gym with a  pool.    2/27 PN Pt has not returned since last re-cert /PN.  She returns today with complaints of  lbp. She has moved and is now residing on bottom floor, no stairs to climb. Pt edu on importance of consistency of care/compliance with HEP for long term management of her chronic conditions. She continues to demonstrate improvements in strength and ROM as noted above. Plan is similar: 1 more aquatic session to finish instructing on final HEP then return to land based for 3-4 visits for final HEP. Final aquatic HEP created and instruction begun.  She tolerates very well.  Will plan on issuing next session.   1/7 PN Pt arrives late for session. She has completed approx half of initially anticipated treatments. Muscle testing demonstrates good improvement meeting/exceeding goal. Aquatic HEP initiated. Pt has land based HEP. Her pain goal has been met but she continues to have pain sensitivity that waxes and wanes. Lumbar ROM improved. She will continue to benefit from a few more sessions. Plan on 2 more aquatic to finish instructing on final aquatic HEP then return to land based.    OBJECTIVE IMPAIRMENTS: Abnormal gait, decreased activity tolerance, decreased endurance, decreased mobility, difficulty walking, decreased ROM, decreased strength, postural dysfunction, obesity, and pain.   ACTIVITY LIMITATIONS: carrying, lifting, bending, sitting, standing, squatting, stairs, transfers, and locomotion level  PARTICIPATION LIMITATIONS: meal prep, cleaning, laundry, driving, shopping, and community activity  PERSONAL FACTORS: 3+ comorbidities: multi joint OA, DMII Migraines  are also affecting patient's functional outcome.   REHAB POTENTIAL: Excellent  CLINICAL DECISION MAKING: Evolving/moderate complexity  EVALUATION COMPLEXITY: Moderate   GOALS: Goals reviewed with patient? Yes  SHORT TERM GOALS: Target date: 06/05/2023  Patient will increase gross bilateral LE strength by 5 lbs   Baseline: Goal status: Continue to build HEP for strengthening 11/21; MET 07/08/23  2.  Patient wil be independent with basic HEP for land and aquatics  Baseline:  Goal status: Continue to build program 11/21; in progress 07/07/22; 08/28/23  3.  Patient will report a 25% reduction in pain in her knees and back  Baseline:  Goal status: 50% reduction - 06/30/23   LONG TERM GOALS: Target date: 09/26/23    Patient will have a complete program that includes cardiovascular activity and an aquatic program  Baseline: currently no access to pool/gym Goal status: In  PROGRESS - 06/30/23; 08/28/23 (met in aquatics 09/05/23)  2.  Patient will go up and down 8 steps with reciprocal gait pattern with less than 2 out of 10 pain Baseline:  Goal status: Has not attempted due to fear of falling- 06/30/23  3.  Patient will walk community distances for exercise without increased pain Baseline:  Goal status: Ongoing 08/28/23  PLAN:  PT FREQUENCY: 1xw  PT DURATION: 5w  PLANNED INTERVENTIONS: Therapeutic exercises, Therapeutic activity, Neuromuscular re-education, Balance training, Gait training, Patient/Family education, Self Care, Joint mobilization, Stair training, DME instructions, Aquatic Therapy, Dry Needling, Electrical stimulation, Cryotherapy, Moist heat, Taping, Manual therapy, and Re-evaluation. Marland Kitchen  PLAN FOR NEXT SESSION:   Land: continue progressive strengthening for hips/knees/core as tolerated.  Update HEP.   Aquatic: Final HEP  Riki Altes, PTA  09/11/23 10:47 AM [facility] [address] [place], [place], [address] Phone: [phone]   Fax:  [phone]

## 2023-09-12 ENCOUNTER — Encounter (HOSPITAL_BASED_OUTPATIENT_CLINIC_OR_DEPARTMENT_OTHER): Payer: 59

## 2023-09-18 DIAGNOSIS — E119 Type 2 diabetes mellitus without complications: Secondary | ICD-10-CM | POA: Diagnosis not present

## 2023-09-18 DIAGNOSIS — H524 Presbyopia: Secondary | ICD-10-CM | POA: Diagnosis not present

## 2023-09-18 DIAGNOSIS — Z794 Long term (current) use of insulin: Secondary | ICD-10-CM | POA: Diagnosis not present

## 2023-09-18 DIAGNOSIS — H52203 Unspecified astigmatism, bilateral: Secondary | ICD-10-CM | POA: Diagnosis not present

## 2023-09-18 LAB — HM DIABETES EYE EXAM

## 2023-09-19 ENCOUNTER — Ambulatory Visit (HOSPITAL_BASED_OUTPATIENT_CLINIC_OR_DEPARTMENT_OTHER): Payer: 59 | Admitting: Physical Therapy

## 2023-09-19 DIAGNOSIS — M25562 Pain in left knee: Secondary | ICD-10-CM | POA: Diagnosis not present

## 2023-09-19 DIAGNOSIS — G8929 Other chronic pain: Secondary | ICD-10-CM

## 2023-09-19 DIAGNOSIS — M25561 Pain in right knee: Secondary | ICD-10-CM | POA: Diagnosis not present

## 2023-09-19 DIAGNOSIS — M5459 Other low back pain: Secondary | ICD-10-CM

## 2023-09-19 DIAGNOSIS — M6281 Muscle weakness (generalized): Secondary | ICD-10-CM | POA: Diagnosis not present

## 2023-09-19 NOTE — Therapy (Addendum)
 OUTPATIENT PHYSICAL THERAPY THORACOLUMBAR TREATMENT   Patient Name: Monica Hunter MRN: [id number] DOB:01-20-1981, 43 y.o., female Today's Date: 10/01/2023  END OF SESSION:  PT End of Session - 10/01/23 0915     Visit Number 13    Number of Visits 15    Date for PT Re-Evaluation 09/26/23    Authorization Type uhc    Authorization Time Period 05/07/23-07/04/23    Activity Tolerance Patient tolerated treatment well    Behavior During Therapy [facility] for tasks assessed/performed                   Past Medical History:  Diagnosis Date   Asthma    Candidal dermatitis 11/04/2021   Cornea abrasion    Diabetes mellitus    Migraines    Morbid obesity (HCC)    Past Surgical History:  Procedure Laterality Date   CERVICAL POLYPECTOMY     DILATION AND CURETTAGE OF UTERUS     Patient Active Problem List   Diagnosis Date Noted   Type 2 diabetes mellitus without complication, with long-term current use of insulin (HCC) 07/17/2023   Vision changes 05/25/2023   Decreased hearing of both ears 05/25/2023   Osteoarthritis 05/25/2023   Allergic rhinitis 05/25/2023   Bilateral chronic knee pain 05/15/2023   Abscess 02/12/2023   Genetic testing 01/06/2023   Bilateral occipital neuralgia 08/13/2022   Chronic bilateral low back pain without sciatica 03/17/2022   Overflow incontinence of urine 12/03/2021   Type 2 diabetes mellitus with diabetic dermatitis, with long-term current use of insulin (HCC) 11/04/2021   Oligomenorrhea 11/04/2021   Gastroesophageal reflux disease 11/04/2021   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   Asthma 02/14/2018   Class 3 severe obesity due to excess calories without serious comorbidity with body mass index (BMI) of 50.0 to 59.9 in adult (HCC) 02/14/2018   Hidradenitis suppurativa 02/14/2018    PCP: Les Pou Early NP   REFERRING PROVIDER: Estill Bamberg Leftwitch   REFERRING DIAG:  Diagnosis  M54.50,G89.29 (ICD-10-CM) - Chronic midline low back pain without  sciatica    Rationale for Evaluation and Treatment: Rehabilitation  THERAPY DIAG:  Other low back pain  Chronic pain of left knee  Muscle weakness (generalized)  ONSET DATE: Many years   SUBJECTIVE:                                                                                                                                                                                           SUBJECTIVE STATEMENT: The patient reports that her back in year still painful she continues to try to work on activities.  She was advised she is going to  have to continue working for a long period of time.  She reports she plans on joining a gym soon.   FROM INITIAL EVAL: Patient has a long history of low back pain.  She reports she has had lower back pain since she was a teenager.  Over the past few years she has developed significant bilateral knee pain as well.  Per patient her her general mobility is decreased.  She enjoyed walking for exercise prior but she is unable to now at this time.  She lives on the second floor and has to go up the stairs sideways.  She feels like when she is walking downstairs her knees may buckle.  PERTINENT HISTORY:  DM II, Migraines; Morbid obesity;   PAIN:  Are you having pain? Yes: NPRS scale: 7/10 Pain location: back Pain description: aching  Aggravating factors: mornings  Relieving factors: lay around    PRECAUTIONS: None   RED FLAGS: None   WEIGHT BEARING RESTRICTIONS: No  FALLS:  Has patient fallen in last 6 months? Larey Seat througha deck   LIVING ENVIRONMENT:  Stairs into the house OCCUPATION:  home care   Hobbies:  Like to get out to walk   PLOF: Independent  PATIENT GOALS:  To have less pain/ To be able to walk longer  NEXT MD VISIT:  Nothing scheduled   OBJECTIVE:  Note: Objective measures were completed at Evaluation unless otherwise noted.  DIAGNOSTIC FINDINGS:  Lumbar MRI  IMPRESSION: 1. Mild lower lumbar spondylosis as described  above. Mild-to-moderate bilateral neuroforaminal stenosis at L5-S1.    PATIENT SURVEYS:  Unable to provide secondary to time  SCREENING FOR RED FLAGS: Bowel or bladder incontinence: No Spinal tumors: No Cauda equina syndrome: No Compression fracture: No Abdominal aneurysm: No  COGNITION: Overall cognitive status: Within functional limits for tasks assessed     SENSATION: Good sensation     POSTURE: rounded shoulders, forward head, and flexed trunk   PALPATION: Tenderness palpation bilateral lower lumbar paraspinals from L3-S1    LUMBAR ROM:   AROM eval 07/08/23 08/28/23  Flexion Pulling at end range full Full P! Returning to upright  Extension Significant pain Wfl pain end range full  Right lateral flexion     Left lateral flexion     Right rotation Limited 25% with pulling Limited 50% pulling full  Left rotation Limited 25% with pulling Limited 50% pulling full   (Blank rows = not tested)  LOWER EXTREMITY ROM:     Active  Right eval Left eval  Hip flexion    Hip extension    Hip abduction    Hip adduction    Hip internal rotation    Hip external rotation    Knee flexion    Knee extension    Ankle dorsiflexion    Ankle plantarflexion    Ankle inversion    Ankle eversion     (Blank rows = not tested)  LOWER EXTREMITY MMT:    MMT Right eval Left eval Right / Left  07/08/23 R / L 08/28/23  Hip flexion 28.1 27.1 45.9 / 40 56.5 / 55.8  Hip extension      Hip abduction 22.1 30.1 45.9 / 48.7 57.7 / 49.5  Hip adduction      Hip internal rotation      Hip external rotation      Knee flexion      Knee extension 18.1 24.5  37.1 / 43.1  Ankle dorsiflexion      Ankle plantarflexion  Ankle inversion      Ankle eversion       (Blank rows = not tested)   FUNCTIONAL TESTS:  Increased pain transferring from sit to stand  GAIT:  Significant lateral movement.  Decreased single-leg stance time on right   TODAY'S TREATMENT 3/21 There ex:  Ball roll  for lumbar stretch x 510-second hold Lateral ball roll 5 x 5-second hold each side Gastroc stretch with strap 2 x 20-second hold  Neuro reeducation  All exercises performed with cueing for posture and core breathing  Ball press down 2 x 10 Tricep press down 3 x 10 30 pounds life fitness Life fitness row 3 x 10 25 pounds Life fitness leg press 20 pounds 3 x 10  Reviewed set up of the each equipment 3/13  Nu-step 6 min L3 Seated ball flexion forward 5 x 10-second hold y  lateral 5 x 10-second holds Seated hip adduction squeeze 5" x20 Seated clam GTB 2x30 Standing hip extension 2x10 Standing lateral flexion stretch L stretch at back of bike 10 sec x4 Sit to stands from elevated plinth 2x10 Heel raise 3x10  Seated March 2x10   Row red 2x10  Shoulder extension red  2x10   HEP review and printout/login info provided       07/28/23 Re-assess completed Self care: instruction on continuity of care importance; compliance of HEP; management of chronic condition  Pt seen for aquatic therapy today.  Treatment took place in water 3.5-4.75 ft in depth at the [facility] pool. Temp of water was 91.  Pt entered/exited the pool via stairs independently with bilat rail.   Exercises - Hand Buoy Carry  - Side Stepping  - Standing 'L' Stretch at [facility]   - Squat  - Standing Hip Flexion Extension at [employer]  - Standing March at [facility]  - Noodle press   - Drawing Bow   - Seated Straddle on [employer] Breast Stroke Arms and Bicycle Legs  - Standing Hip Flexion Extension   - Standing Hip Abduction      PATIENT EDUCATION:  Education details: HEP, symptom management, progression of activities, importance of cardiovascular activity. Person educated: Patient Education method: Explanation, Demonstration, Tactile cues, Verbal cues, and Handouts Education comprehension: verbalized understanding, returned demonstration, verbal cues required, tactile cues  required, and needs further education  HOME EXERCISE PROGRAM: Access Code: [email or web address] URL: [email or web address] Date: 05/14/2023 Prepared by: Riki Altes  Exercises - Seated Flexion Stretch  - 2 x daily - 7 x weekly - 5 sets - 10-15 seconds hold - Standing 3-Way Leg Reach with Resistance at Ankles and Counter Support  - 2 x daily - 7 x weekly - 2 sets - 10 reps - Clamshell  - 2 x daily - 7 x weekly - 3 sets - 10 reps - Seated March  - 1 x daily - 7 x weekly - 3 sets - 10 reps - Seated Long Arc Quad  - 1 x daily - 7 x weekly - 3 sets - 10 reps - 5 seconds hold   Aquatic This aquatic home exercise program from [employer] utilizes pictures from land based exercises, but has been adapted prior to lamination and issuance.    Access Code: [email or web address] URL: [email or web address] Date: 07/08/2023 Prepared by: Geni Bers Updated 08/27/26 This aquatic home exercise program from [employer] utilizes pictures from land based exercises, but has been adapted prior to lamination and issuance.  Issued  Exercises - Wellsite geologist  - 1 x  daily - 1-3 x weekly - Side Stepping  - 1 x daily - 1-3 x weekly - Standing 'L' Stretch at [employer]  - 1 x daily - 1-3 x weekly - 1-2 sets - 3 reps - Squat  - 1 x daily - 1-3 x weekly - 1-2 sets - 10 reps - Standing Hip Flexion Extension at [employer]  - 1 x daily - 1-3 x weekly - 1-2 sets - 10 reps - Standing March at [facility]  - 1 x daily - 1-3 x weekly - 1-2 sets - 10 reps - Noodle press  - 1 x daily - 1-3 x weekly - 1-2 sets - 10 reps - Drawing Bow  - 1 x daily - 1-3 x weekly - 1-2 sets - 10 reps - Seated Straddle on [employer]ot[employer] Forward Breast Stroke Arms and Bicycle Legs  - 1 x daily - 1-3 x weekly - Standing Hip Flexion Extension  - 1 x daily - 1-3 x weekly - 1-2 sets - 10 reps - Standing Hip Abduction  - 1 x daily - 1-3 x weekly - 1-2 sets - 10 reps   ASSESSMENT:  CLINICAL IMPRESSION: Patient reported ankle pain upon on  arrival today.  Therapy gave her an ankle stretch to work on at home.  Overall she feels like her knees and back are still painful.  She is motivated to find access to a pool.  She is to get a list of places that accept her insurance.  Those include [facility].  She was also advised to check upfront to see if will accept her insurance.  She plans on joining a gym.  We got her into the gym for exercises today.  She tolerated well.  She reported only mild increase in pain.  She was able to self adjust weights.  We reviewed RPE for exercises.  She is advised to find a challenging level for self but not overly hard.  The patient has 1 more visit scheduled.  We will likely discharge to HEP at this time.  Review HEP next visit.  2/27 PN Pt has not returned since last re-cert /PN.  She returns today with complaints of  lbp. She has moved and is now residing on bottom floor, no stairs to climb. Pt edu on importance of consistency of care/compliance with HEP for long term management of her chronic conditions. She continues to demonstrate improvements in strength and ROM as noted above. Plan is similar: 1 more aquatic session to finish instructing on final HEP then return to land based for 3-4 visits for final HEP. Final aquatic HEP created and instruction begun.  She tolerates very well.  Will plan on issuing next session.   1/7 PN Pt arrives late for session. She has completed approx half of initially anticipated treatments. Muscle testing demonstrates good improvement meeting/exceeding goal. Aquatic HEP initiated. Pt has land based HEP. Her pain goal has been met but she continues to have pain sensitivity that waxes and wanes. Lumbar ROM improved. She will continue to benefit from a few more sessions. Plan on 2 more aquatic to finish instructing on final aquatic HEP then return to land based.    OBJECTIVE IMPAIRMENTS: Abnormal gait, decreased activity tolerance, decreased endurance, decreased mobility, difficulty  walking, decreased ROM, decreased strength, postural dysfunction, obesity, and pain.   ACTIVITY LIMITATIONS: carrying, lifting, bending, sitting, standing, squatting, stairs, transfers, and locomotion level  PARTICIPATION LIMITATIONS: meal prep, cleaning, laundry, driving, shopping, and community activity  PERSONAL FACTORS: 3+ comorbidities: multi joint OA, DMII Migraines  are also affecting patient's functional outcome.   REHAB POTENTIAL: Excellent  CLINICAL DECISION MAKING: Evolving/moderate complexity  EVALUATION COMPLEXITY: Moderate   GOALS: Goals reviewed with patient? Yes  SHORT TERM GOALS: Target date: 06/05/2023  Patient will increase gross bilateral LE strength by 5 lbs  Baseline: Goal status: Continue to build HEP for strengthening 11/21; MET 07/08/23  2.  Patient wil be independent with basic HEP for land and aquatics  Baseline:  Goal status: Continue to build program 11/21; in progress 07/07/22; 08/28/23  3.  Patient will report a 25% reduction in pain in her knees and back  Baseline:  Goal status: 50% reduction - 06/30/23   LONG TERM GOALS: Target date: 09/26/23    Patient will have a complete program that includes cardiovascular activity and an aquatic program  Baseline: currently no access to pool/gym Goal status: In PROGRESS - 06/30/23; 08/28/23 (met in aquatics 09/05/23)  2.  Patient will go up and down 8 steps with reciprocal gait pattern with less than 2 out of 10 pain Baseline:  Goal status: Has not attempted due to fear of falling- 06/30/23  3.  Patient will walk community distances for exercise without increased pain Baseline:  Goal status: Ongoing 08/28/23  PLAN:  PT FREQUENCY: 1xw  PT DURATION: 5w  PLANNED INTERVENTIONS: Therapeutic exercises, Therapeutic activity, Neuromuscular re-education, Balance training, Gait training, Patient/Family education, Self Care, Joint mobilization, Stair training, DME instructions, Aquatic Therapy, Dry Needling,  Electrical stimulation, Cryotherapy, Moist heat, Taping, Manual therapy, and Re-evaluation. Marland Kitchen  PLAN FOR NEXT SESSION:   Land: continue progressive strengthening for hips/knees/core as tolerated.  Update HEP.   Aquatic: Final HEP  Riki Altes, PTA  10/01/23 9:15 AM [facility] [address] [place], [place], [address] Phone: [phone]   Fax:  [phone]

## 2023-09-22 DIAGNOSIS — H5213 Myopia, bilateral: Secondary | ICD-10-CM | POA: Diagnosis not present

## 2023-09-26 ENCOUNTER — Ambulatory Visit (HOSPITAL_BASED_OUTPATIENT_CLINIC_OR_DEPARTMENT_OTHER): Payer: 59

## 2023-10-01 ENCOUNTER — Encounter (HOSPITAL_BASED_OUTPATIENT_CLINIC_OR_DEPARTMENT_OTHER): Payer: Self-pay

## 2023-10-01 ENCOUNTER — Ambulatory Visit (HOSPITAL_BASED_OUTPATIENT_CLINIC_OR_DEPARTMENT_OTHER): Attending: Advanced Practice Midwife

## 2023-10-01 ENCOUNTER — Telehealth: Payer: Self-pay | Admitting: Nurse Practitioner

## 2023-10-01 DIAGNOSIS — G8929 Other chronic pain: Secondary | ICD-10-CM | POA: Diagnosis not present

## 2023-10-01 DIAGNOSIS — M25561 Pain in right knee: Secondary | ICD-10-CM | POA: Diagnosis not present

## 2023-10-01 DIAGNOSIS — M6281 Muscle weakness (generalized): Secondary | ICD-10-CM | POA: Insufficient documentation

## 2023-10-01 DIAGNOSIS — M25562 Pain in left knee: Secondary | ICD-10-CM | POA: Insufficient documentation

## 2023-10-01 DIAGNOSIS — M5459 Other low back pain: Secondary | ICD-10-CM | POA: Insufficient documentation

## 2023-10-01 NOTE — Telephone Encounter (Signed)
 Fax from [facility]  Diclof[place]ac

## 2023-10-01 NOTE — Therapy (Signed)
 OUTPATIENT PHYSICAL THERAPY THORACOLUMBAR TREATMENT PHYSICAL THERAPY DISCHARGE SUMMARY  Visits from Start of Care: 14  Current functional level related to goals / functional outcomes: See below   Remaining deficits: See below   Education / Equipment: See below   Patient agrees to discharge. Patient goals were partially met. Patient is being discharged due to maximized rehab potential.    Lenore Manner, PTA 10/01/2023, 11:32 AM    Patient Name: Monica Hunter MRN: [id number] DOB:09/09/1980, 43 y.o., female Today's Date: 10/01/2023  END OF SESSION:  PT End of Session - 10/01/23 1003     Visit Number 14    Number of Visits 15    Date for PT Re-Evaluation 09/26/23    Authorization Type uhc    Authorization Time Period 05/07/23-07/04/23    Authorization - Number of Visits 16    PT Start Time 0944   pt arrived late   PT Stop Time 1015    PT Time Calculation (min) 31 min    Activity Tolerance Patient limited by pain    Behavior During Therapy [facility] for tasks assessed/performed                   Past Medical History:  Diagnosis Date   Asthma    Candidal dermatitis 11/04/2021   Cornea abrasion    Diabetes mellitus    Migraines    Morbid obesity (HCC)    Past Surgical History:  Procedure Laterality Date   CERVICAL POLYPECTOMY     DILATION AND CURETTAGE OF UTERUS     Patient Active Problem List   Diagnosis Date Noted   Type 2 diabetes mellitus without complication, with long-term current use of insulin (HCC) 07/17/2023   Vision changes 05/25/2023   Decreased hearing of both ears 05/25/2023   Osteoarthritis 05/25/2023   Allergic rhinitis 05/25/2023   Bilateral chronic knee pain 05/15/2023   Abscess 02/12/2023   Genetic testing 01/06/2023   Bilateral occipital neuralgia 08/13/2022   Chronic bilateral low back pain without sciatica 03/17/2022   Overflow incontinence of urine 12/03/2021   Type 2 diabetes mellitus with diabetic dermatitis, with long-term  current use of insulin (HCC) 11/04/2021   Oligomenorrhea 11/04/2021   Gastroesophageal reflux disease 11/04/2021   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   Asthma 02/14/2018   Class 3 severe obesity due to excess calories without serious comorbidity with body mass index (BMI) of 50.0 to 59.9 in adult (HCC) 02/14/2018   Hidradenitis suppurativa 02/14/2018    PCP: Les Pou Early NP   REFERRING PROVIDER: Estill Bamberg Leftwitch   REFERRING DIAG:  Diagnosis  M54.50,G89.29 (ICD-10-CM) - Chronic midline low back pain without sciatica    Rationale for Evaluation and Treatment: Rehabilitation  THERAPY DIAG:  Other low back pain  Muscle weakness (generalized)  Chronic pain of left knee  Chronic pain of right knee  ONSET DATE: Many years   SUBJECTIVE:  SUBJECTIVE STATEMENT: Pt reports she has not yet found a gym. Had a death in the family last week. "Mornings are not my thing." Ankle feels like it has a "cramp" in it sometimes, but hasn't bothered her the last couple of days.    FROM INITIAL EVAL: Patient has a long history of low back pain.  She reports she has had lower back pain since she was a teenager.  Over the past few years she has developed significant bilateral knee pain as well.  Per patient her her general mobility is decreased.  She enjoyed walking for exercise prior but she is unable to now at this time.  She lives on the second floor and has to go up the stairs sideways.  She feels like when she is walking downstairs her knees may buckle.  PERTINENT HISTORY:  DM II, Migraines; Morbid obesity;   PAIN:  Are you having pain? Yes: NPRS scale: 7/10 Pain location: back Pain description: aching  Aggravating factors: mornings  Relieving factors: lay around    PRECAUTIONS: None   RED  FLAGS: None   WEIGHT BEARING RESTRICTIONS: No  FALLS:  Has patient fallen in last 6 months? Larey Seat througha deck   LIVING ENVIRONMENT:  Stairs into the house OCCUPATION:  home care   Hobbies:  Like to get out to walk   PLOF: Independent  PATIENT GOALS:  To have less pain/ To be able to walk longer  NEXT MD VISIT:  Nothing scheduled   OBJECTIVE:  Note: Objective measures were completed at Evaluation unless otherwise noted.  DIAGNOSTIC FINDINGS:  Lumbar MRI  IMPRESSION: 1. Mild lower lumbar spondylosis as described above. Mild-to-moderate bilateral neuroforaminal stenosis at L5-S1.    PATIENT SURVEYS:  Unable to provide secondary to time  SCREENING FOR RED FLAGS: Bowel or bladder incontinence: No Spinal tumors: No Cauda equina syndrome: No Compression fracture: No Abdominal aneurysm: No  COGNITION: Overall cognitive status: Within functional limits for tasks assessed     SENSATION: Good sensation     POSTURE: rounded shoulders, forward head, and flexed trunk   PALPATION: Tenderness palpation bilateral lower lumbar paraspinals from L3-S1    LUMBAR ROM:   AROM eval 07/08/23 08/28/23 10/01/23  Flexion Pulling at end range full Full P! Returning to upright Full P! Returning to upright  Extension Significant pain Wfl pain end range full full  Right lateral flexion      Left lateral flexion      Right rotation Limited 25% with pulling Limited 50% pulling full full  Left rotation Limited 25% with pulling Limited 50% pulling full full   (Blank rows = not tested)  LOWER EXTREMITY ROM:     Active  Right eval Left eval  Hip flexion    Hip extension    Hip abduction    Hip adduction    Hip internal rotation    Hip external rotation    Knee flexion    Knee extension    Ankle dorsiflexion    Ankle plantarflexion    Ankle inversion    Ankle eversion     (Blank rows = not tested)  LOWER EXTREMITY MMT:    MMT Right eval Left eval Right / Left   07/08/23 R / L 08/28/23 Right/Left 4/2  Hip flexion 28.1 27.1 45.9 / 40 56.5 / 55.8 72.5/65.4  Hip extension       Hip abduction 22.1 30.1 45.9 / 48.7 57.7 / 49.5 57.6/51.7  Hip adduction  Hip internal rotation       Hip external rotation       Knee flexion       Knee extension 18.1 24.5  37.1 / 43.1 57.4/55.3  Ankle dorsiflexion       Ankle plantarflexion       Ankle inversion       Ankle eversion        (Blank rows = not tested)   FUNCTIONAL TESTS:  Increased pain transferring from sit to stand  GAIT:  Significant lateral movement.  Decreased single-leg stance time on right   TODAY'S TREATMENT  10/01/23 Updated goals Updated strength and ROM Reviewed HEP  Nu step x68min L3 Seated lumbar flexion stretch Seated HSS bil     PATIENT EDUCATION:  Education details: HEP, symptom management, progression of activities, importance of cardiovascular activity. Person educated: Patient Education method: Explanation, Demonstration, Tactile cues, Verbal cues, and Handouts Education comprehension: verbalized understanding, returned demonstration, verbal cues required, tactile cues required, and needs further education  HOME EXERCISE PROGRAM: Access Code: [email or web address] URL: [email or web address] Date: 05/14/2023 Prepared by: Riki Altes  Exercises - Seated Flexion Stretch  - 2 x daily - 7 x weekly - 5 sets - 10-15 seconds hold - Standing 3-Way Leg Reach with Resistance at Ankles and Counter Support  - 2 x daily - 7 x weekly - 2 sets - 10 reps - Clamshell  - 2 x daily - 7 x weekly - 3 sets - 10 reps - Seated March  - 1 x daily - 7 x weekly - 3 sets - 10 reps - Seated Long Arc Quad  - 1 x daily - 7 x weekly - 3 sets - 10 reps - 5 seconds hold   Aquatic This aquatic home exercise program from [employer] utilizes pictures from land based exercises, but has been adapted prior to lamination and issuance.    Access Code: [email or web address] URL:  [email or web address] Date: 07/08/2023 Prepared by: Geni Bers Updated 08/27/26 This aquatic home exercise program from [employer] utilizes pictures from land based exercises, but has been adapted prior to lamination and issuance.  Issued  Exercises - Hand Buoy Carry  - 1 x daily - 1-3 x weekly - Side Stepping  - 1 x daily - 1-3 x weekly - Standing 'L' Stretch at [employer]  - 1 x daily - 1-3 x weekly - 1-2 sets - 3 reps - Squat  - 1 x daily - 1-3 x weekly - 1-2 sets - 10 reps - Standing Hip Flexion Extension at [employer]  - 1 x daily - 1-3 x weekly - 1-2 sets - 10 reps - Standing March at [facility]  - 1 x daily - 1-3 x weekly - 1-2 sets - 10 reps - Noodle press  - 1 x daily - 1-3 x weekly - 1-2 sets - 10 reps - Drawing Bow  - 1 x daily - 1-3 x weekly - 1-2 sets - 10 reps - Seated Straddle on Flotation Forward Breast Stroke Arms and Bicycle Legs  - 1 x daily - 1-3 x weekly - Standing Hip Flexion Extension  - 1 x daily - 1-3 x weekly - 1-2 sets - 10 reps - Standing Hip Abduction  - 1 x daily - 1-3 x weekly - 1-2 sets - 10 reps   ASSESSMENT:  CLINICAL IMPRESSION: Pt has attended 14 visits of PT thus far and has made progress with knee pain and strength. Pt denies subjective improvement in back pain since  starting PT. Objective strength testing shows significant increase in knee extension and hip flexion strength and mild increase in hip abduction strength. She has met 1/3 STG and 1/3 LTG. Pt feels confident with aquatic and land HEP's  and plans to join a gym. Pt has reached maximum level of improvement with PT at this time and will be d/c to HEP at this time. Spent time reviewing and printing out HEP for pt with good understanding.   2/27 PN Pt has not returned since last re-cert /PN.  She returns today with complaints of  lbp. She has moved and is now residing on bottom floor, no stairs to climb. Pt edu on importance of consistency of care/compliance with HEP for long term  management of her chronic conditions. She continues to demonstrate improvements in strength and ROM as noted above. Plan is similar: 1 more aquatic session to finish instructing on final HEP then return to land based for 3-4 visits for final HEP. Final aquatic HEP created and instruction begun.  She tolerates very well.  Will plan on issuing next session.   1/7 PN Pt arrives late for session. She has completed approx half of initially anticipated treatments. Muscle testing demonstrates good improvement meeting/exceeding goal. Aquatic HEP initiated. Pt has land based HEP. Her pain goal has been met but she continues to have pain sensitivity that waxes and wanes. Lumbar ROM improved. She will continue to benefit from a few more sessions. Plan on 2 more aquatic to finish instructing on final aquatic HEP then return to land based.    OBJECTIVE IMPAIRMENTS: Abnormal gait, decreased activity tolerance, decreased endurance, decreased mobility, difficulty walking, decreased ROM, decreased strength, postural dysfunction, obesity, and pain.   ACTIVITY LIMITATIONS: carrying, lifting, bending, sitting, standing, squatting, stairs, transfers, and locomotion level  PARTICIPATION LIMITATIONS: meal prep, cleaning, laundry, driving, shopping, and community activity  PERSONAL FACTORS: 3+ comorbidities: multi joint OA, DMII Migraines  are also affecting patient's functional outcome.   REHAB POTENTIAL: Excellent  CLINICAL DECISION MAKING: Evolving/moderate complexity  EVALUATION COMPLEXITY: Moderate   GOALS: Goals reviewed with patient? Yes  SHORT TERM GOALS: Target date: 06/05/2023  Patient will increase gross bilateral LE strength by 5 lbs  Baseline: Goal status: Partially met- met for hip flexion and knee extension bilaterally.  2.  Patient wil be independent with basic HEP for land and aquatics  Baseline:  Goal status: MET 4/2  3.  Patient will report a 25% reduction in pain in her knees and back   Baseline:  Goal status: IN PROGRESS- 50% reduction in knees, no improvement in back pain 4/2   LONG TERM GOALS: Target date: 09/26/23    Patient will have a complete program that includes cardiovascular activity and an aquatic program  Baseline: currently no access to pool/gym Goal status: MET 4/2  2.  Patient will go up and down 8 steps with reciprocal gait pattern with less than 2 out of 10 pain Baseline:  Goal status: NOT MET- Went up/down stairs in February when moving and had significant pain- 4/2  3.  Patient will walk community distances for exercise without increased pain Baseline:  Goal status: Ongoing 4/2  PLAN:  PT FREQUENCY: 1xw  PT DURATION: 5w  PLANNED INTERVENTIONS: Therapeutic exercises, Therapeutic activity, Neuromuscular re-education, Balance training, Gait training, Patient/Family education, Self Care, Joint mobilization, Stair training, DME instructions, Aquatic Therapy, Dry Needling, Electrical stimulation, Cryotherapy, Moist heat, Taping, Manual therapy, and Re-evaluation. Marland Kitchen  PLAN FOR NEXT SESSION:   DC to HEP  and gym program  Riki Altes, [place]  10/01/23 11:32 AM [facility] [address] [place], [place], [address] Phone: [phone]   Fax:  [phone]

## 2023-10-02 ENCOUNTER — Telehealth: Payer: Self-pay | Admitting: Nurse Practitioner

## 2023-10-02 NOTE — Telephone Encounter (Signed)
 Patient was identified as falling into the [place] Measure - Diabetes.   Patient was: Appointment scheduled with primary care provider in the next 30 days.  Last appt 05/15/23, next appt CPE 11/04/23, last A1c improved to 6.9

## 2023-10-03 ENCOUNTER — Other Ambulatory Visit: Payer: Self-pay

## 2023-10-03 DIAGNOSIS — Z794 Long term (current) use of insulin: Secondary | ICD-10-CM

## 2023-10-03 DIAGNOSIS — E119 Type 2 diabetes mellitus without complications: Secondary | ICD-10-CM

## 2023-10-03 MED ORDER — TIRZEPATIDE 5 MG/0.5ML ~~LOC~~ SOAJ: 5.0000 mg | SUBCUTANEOUS | 3 refills | Status: DC

## 2023-10-03 MED ORDER — DEXCOM G7 SENSOR MISC: 3 refills | Status: DC

## 2023-10-08 ENCOUNTER — Other Ambulatory Visit (HOSPITAL_COMMUNITY): Payer: Self-pay

## 2023-10-09 ENCOUNTER — Telehealth: Payer: Self-pay

## 2023-10-09 ENCOUNTER — Other Ambulatory Visit: Payer: Self-pay

## 2023-10-09 MED ORDER — DICLOFENAC SODIUM 50 MG PO TBEC: 50.0000 mg | DELAYED_RELEASE_TABLET | ORAL | 1 refills | Status: DC | PRN

## 2023-10-09 NOTE — Telephone Encounter (Signed)
 Pharmacy Patient Advocate Encounter   Received notification from CoverMyMeds that prior authorization for Dexcom G7 sensor is required/requested.   Insurance verification completed.   The patient is insured through [facility] .   Per test claim: PA required; PA submitted to above mentioned insurance via CoverMyMeds Key/confirmation #/EOC [id number] Status is pending

## 2023-10-10 ENCOUNTER — Other Ambulatory Visit: Payer: Self-pay | Admitting: Nurse Practitioner

## 2023-10-11 ENCOUNTER — Other Ambulatory Visit (HOSPITAL_BASED_OUTPATIENT_CLINIC_OR_DEPARTMENT_OTHER): Payer: Self-pay

## 2023-10-11 ENCOUNTER — Other Ambulatory Visit (HOSPITAL_COMMUNITY): Payer: Self-pay

## 2023-10-13 ENCOUNTER — Other Ambulatory Visit (HOSPITAL_COMMUNITY): Payer: Self-pay

## 2023-10-13 ENCOUNTER — Other Ambulatory Visit: Payer: Self-pay

## 2023-10-13 MED ORDER — SUMATRIPTAN SUCCINATE 100 MG PO TABS
100.0000 mg | ORAL_TABLET | ORAL | 2 refills | Status: AC | PRN
  Filled 2023-10-13: qty 9, 28d supply, fill #0

## 2023-10-19 IMAGING — DX DG CHEST 2V
2 series · 2 of 2 positions shown · non-contrast
Comparison: May 30, 2021.

CLINICAL DATA: Cough.

EXAM:
CHEST - 2 VIEW

[chest lat]
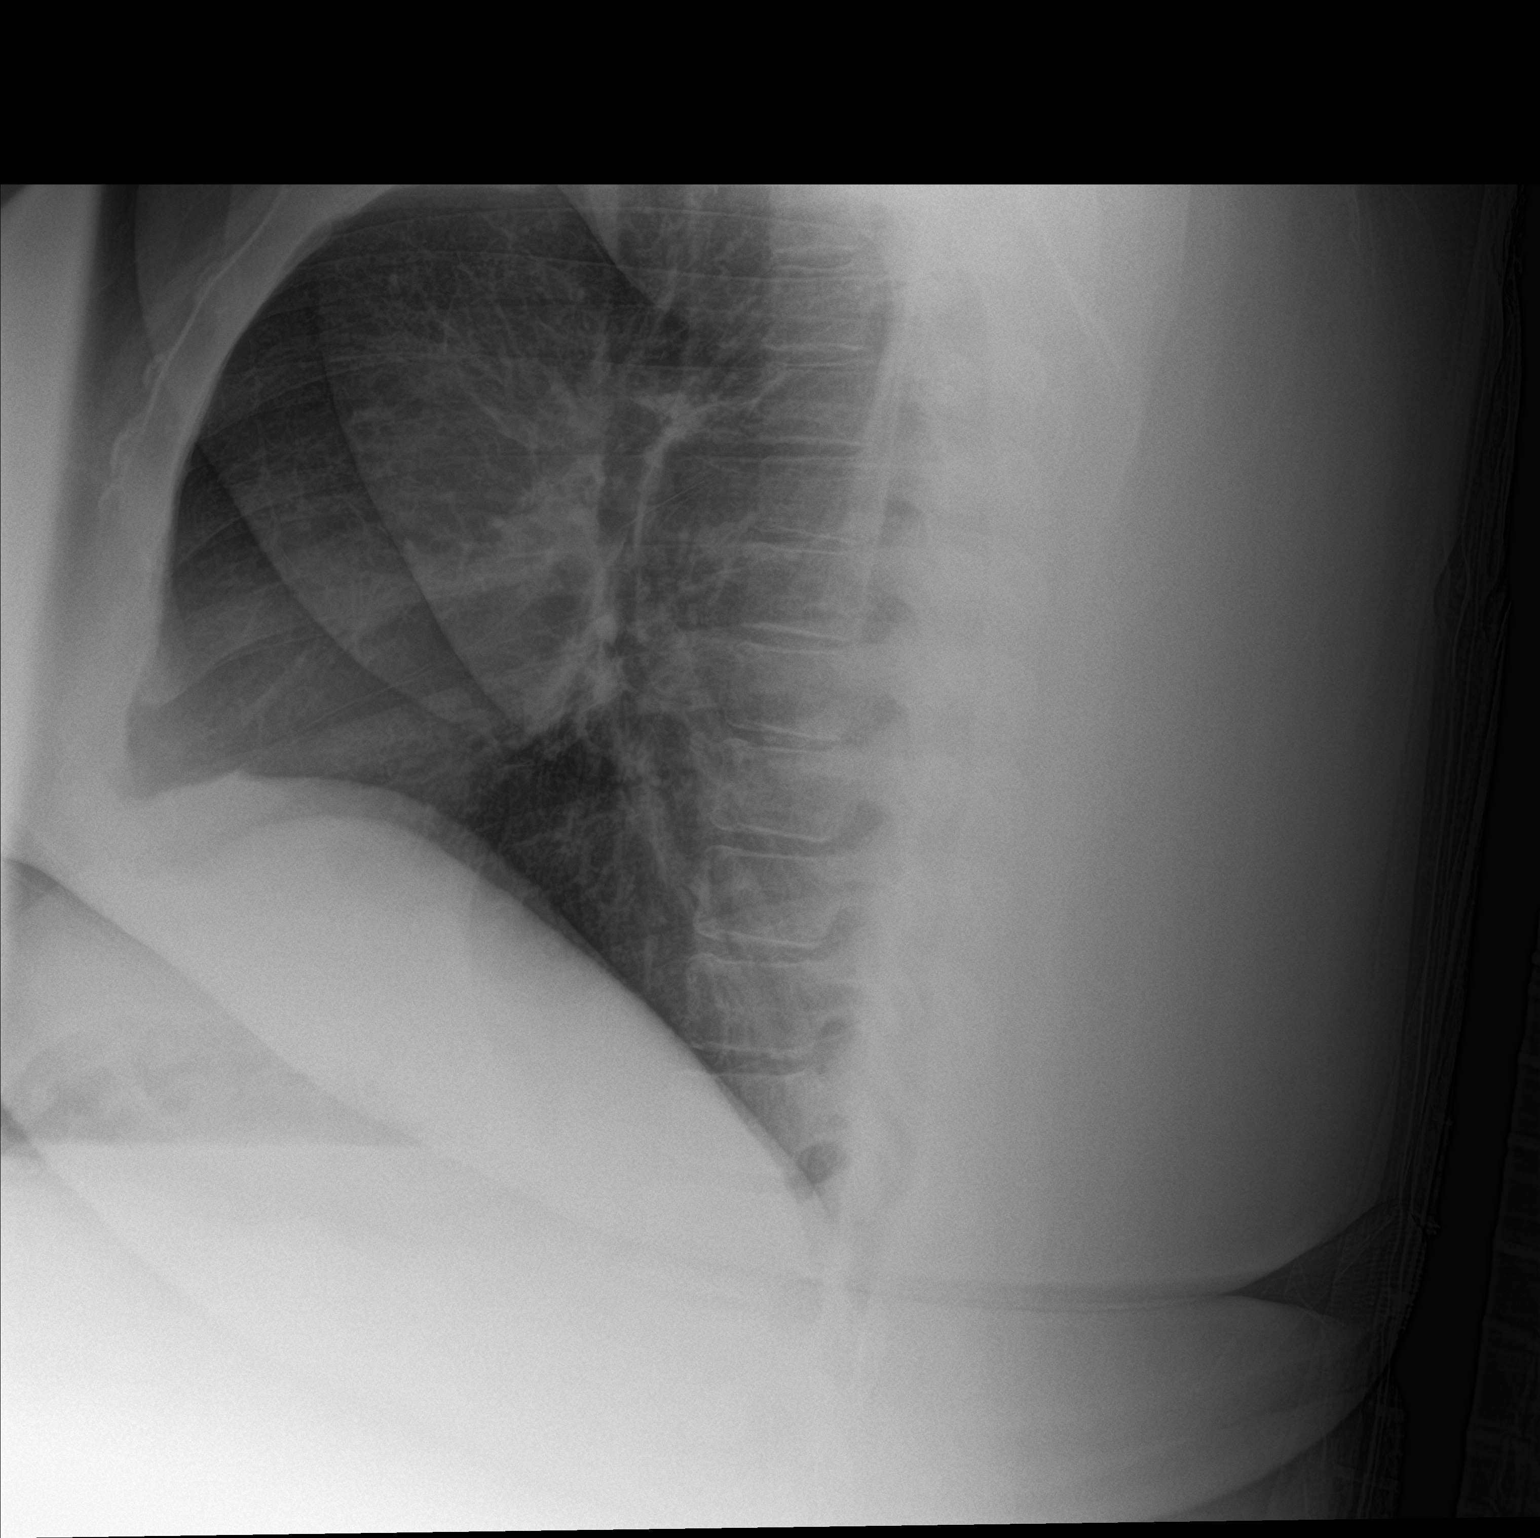

[chest ap]
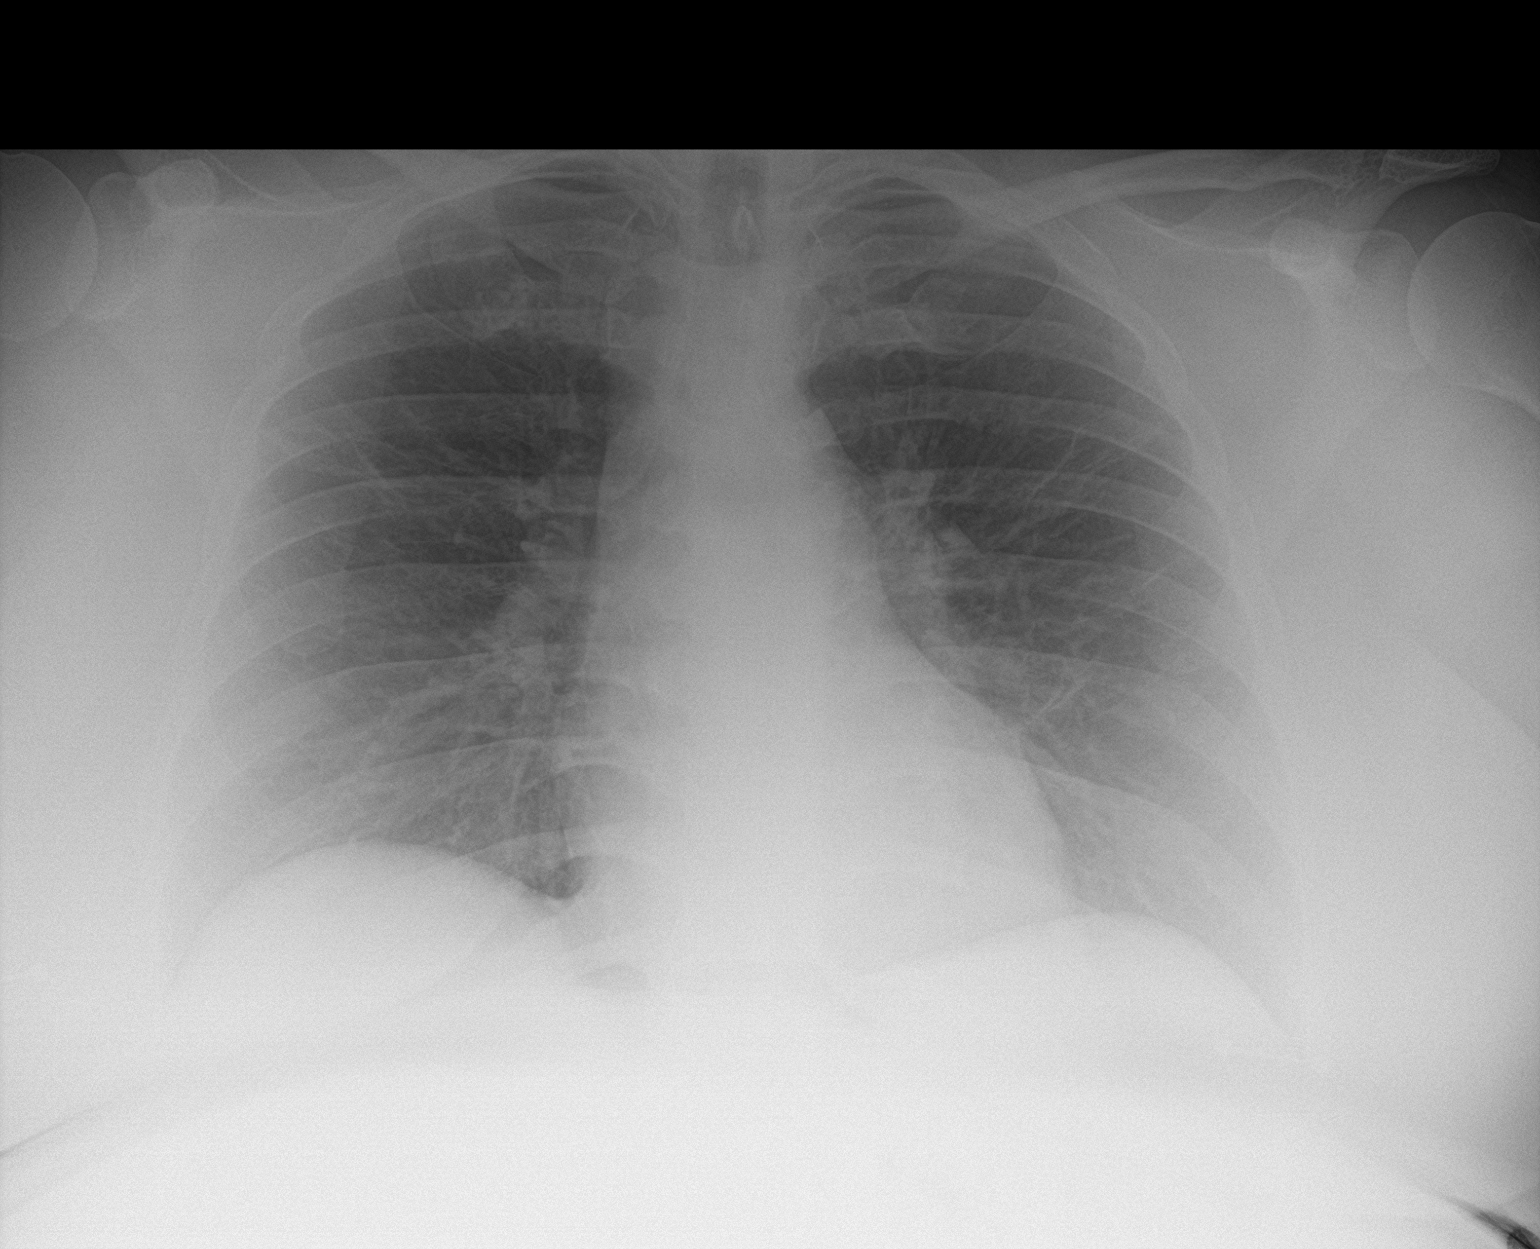

[2 of 2 positions shown; findings below may reference images not displayed]

FINDINGS: Assessment of the lungs is limited by overlying soft tissue. No
definite consolidation. No visible pleural effusions or
pneumothorax. Cardiomediastinal silhouette is within normal limits.
IMPRESSION: Limited study. No definite evidence of acute cardiopulmonary
disease.

## 2023-11-04 ENCOUNTER — Other Ambulatory Visit: Payer: Self-pay

## 2023-11-04 ENCOUNTER — Other Ambulatory Visit (HOSPITAL_COMMUNITY): Payer: Self-pay

## 2023-11-04 ENCOUNTER — Ambulatory Visit: Payer: Medicare Other | Admitting: Nurse Practitioner

## 2023-11-04 VITALS — BP 128/80 | HR 72 | Ht 68.0 in | Wt 372.6 lb

## 2023-11-04 DIAGNOSIS — E66813 Obesity, class 3: Secondary | ICD-10-CM | POA: Diagnosis not present

## 2023-11-04 DIAGNOSIS — M545 Low back pain, unspecified: Secondary | ICD-10-CM | POA: Diagnosis not present

## 2023-11-04 DIAGNOSIS — E11620 Type 2 diabetes mellitus with diabetic dermatitis: Secondary | ICD-10-CM | POA: Diagnosis not present

## 2023-11-04 DIAGNOSIS — B009 Herpesviral infection, unspecified: Secondary | ICD-10-CM | POA: Diagnosis not present

## 2023-11-04 DIAGNOSIS — G8929 Other chronic pain: Secondary | ICD-10-CM

## 2023-11-04 DIAGNOSIS — Z6841 Body Mass Index (BMI) 40.0 and over, adult: Secondary | ICD-10-CM

## 2023-11-04 DIAGNOSIS — K219 Gastro-esophageal reflux disease without esophagitis: Secondary | ICD-10-CM | POA: Diagnosis not present

## 2023-11-04 DIAGNOSIS — M150 Primary generalized (osteo)arthritis: Secondary | ICD-10-CM | POA: Diagnosis not present

## 2023-11-04 DIAGNOSIS — Z794 Long term (current) use of insulin: Secondary | ICD-10-CM | POA: Diagnosis not present

## 2023-11-04 DIAGNOSIS — Z0000 Encounter for general adult medical examination without abnormal findings: Secondary | ICD-10-CM

## 2023-11-04 DIAGNOSIS — E119 Type 2 diabetes mellitus without complications: Secondary | ICD-10-CM

## 2023-11-04 DIAGNOSIS — Z111 Encounter for screening for respiratory tuberculosis: Secondary | ICD-10-CM

## 2023-11-04 DIAGNOSIS — J3089 Other allergic rhinitis: Secondary | ICD-10-CM | POA: Diagnosis not present

## 2023-11-04 DIAGNOSIS — M7662 Achilles tendinitis, left leg: Secondary | ICD-10-CM

## 2023-11-04 DIAGNOSIS — E559 Vitamin D deficiency, unspecified: Secondary | ICD-10-CM

## 2023-11-04 DIAGNOSIS — R5383 Other fatigue: Secondary | ICD-10-CM

## 2023-11-04 DIAGNOSIS — R4586 Emotional lability: Secondary | ICD-10-CM

## 2023-11-04 DIAGNOSIS — R630 Anorexia: Secondary | ICD-10-CM

## 2023-11-04 LAB — LIPID PANEL

## 2023-11-04 MED ORDER — ONDANSETRON 4 MG PO TBDP
4.0000 mg | ORAL_TABLET | Freq: Three times a day (TID) | ORAL | 3 refills | Status: DC | PRN
  Filled 2023-11-04: qty 30, 10d supply, fill #0

## 2023-11-04 MED ORDER — PANTOPRAZOLE SODIUM 40 MG PO TBEC
40.0000 mg | DELAYED_RELEASE_TABLET | Freq: Every day | ORAL | 3 refills | Status: AC
Start: 2023-11-04 — End: ?
  Filled 2023-11-04 – 2023-11-06 (×3): qty 90, 90d supply, fill #0

## 2023-11-04 MED ORDER — DICLOFENAC SODIUM 50 MG PO TBEC
DELAYED_RELEASE_TABLET | ORAL | 3 refills | Status: AC
Start: 2023-11-04 — End: ?
  Filled 2023-11-04: qty 270, 90d supply, fill #0
  Filled 2023-11-04: qty 270, fill #0

## 2023-11-04 NOTE — Patient Instructions (Addendum)
 Delynn Fill with your move!! I hope things go wonderfully for you!!   You have to eat to keep your blood sugars from dropping. Look over the low carb/high protein snack list to pick small snacks that you can keep on hand.   I recommend increasing your activity with walking at least 10 minutes a day to help with weight management. It is also very important that you eat a well balanced diet for weight management.

## 2023-11-04 NOTE — Progress Notes (Unsigned)
 Monica Fennel, DNP, AGNP-c Monica Hunter [address] [place], [place] [address] Main Office [phone]  BP 128/80   Pulse 72   Ht 5\' 8"  (1.727 m)   Wt (!) 372 lb 9.6 oz (169 kg)   LMP  (LMP Unknown)   BMI 56.65 kg/m    Subjective:    Patient ID: Monica Hunter, female    DOB: 04-May-1981, 43 y.o.   MRN: [id number]  HPI: Monica Hunter is a 43 y.o. female presenting on 11/04/2023 for comprehensive medical examination.   History of Present Illness Monica Hunter "Archie Bearded" is a 43 year old female with allergies and diabetes who presents for a follow-up visit.  She has ongoing issues with allergies, experiencing hives, nasal congestion, and burning eyes. Allergy testing revealed sensitivities to trees, grass, dog dander, cat dander, mold, and cockroaches. She is currently taking Xyzal  and montelukast  for her symptoms, but her Xyzal  is in [place] , which may be contributing to her current symptoms. She experiences significant nasal congestion with thick mucus, which she attributes to her allergies. Her living environment had mold due to a water leak, which may have exacerbated her symptoms.  She manages her diabetes with Mounjaro , Lantus , and occasionally insulin . Mounjaro  helps keep her blood sugar stable, but there are days when her sugar drops below 60, especially when she hasn't eaten. She is concerned about maintaining her medication regimen due to insurance coverage issues.  She has musculoskeletal pain, particularly aching from the middle of her back to her ankles, which she associates with her sleeping position. She finds relief with Voltaren , taking two tablets in the morning and one at bedtime. She also reports sharp, stinging pain in her ankle, which she attributes to not wearing shoes and possibly Achilles tendonitis.  She is dealing with weight management issues, expressing difficulty in losing weight despite not having a significant  appetite.  She is currently taking pantoprazole  for gastrointestinal issues and has a supply of Valtrex  for herpes management, which she uses as needed. She has not had an outbreak since her initial diagnosis.  She is in the process of moving from [place]  to [place]  due to financial constraints and is concerned about transferring her medical care and insurance coverage. She has been living alone and is looking forward to being with family for support.  Pertinent items are noted in HPI.   Most Recent Depression Screen:     11/04/2023    9:41 AM 04/01/2023    1:31 PM 12/30/2022    2:25 PM 09/05/2022    3:34 PM 06/17/2022    2:38 PM  Depression screen PHQ 2/9  Decreased Interest 0 0 0 0 0  Down, Depressed, Hopeless 1 0 0 0 0  PHQ - 2 Score 1 0 0 0 0   Most Recent Anxiety Screen:     05/13/2022   11:16 AM 04/15/2022    9:56 AM  GAD 7 : Generalized Anxiety Score  Nervous, Anxious, on Edge 1 1  Control/stop worrying 1 1  Worry too much - different things 1 0  Trouble relaxing 1 1  Restless 1 1  Easily annoyed or irritable 1 1  Afraid - awful might happen 1 1  Total GAD 7 Score 7 6  Anxiety Difficulty Somewhat difficult Somewhat difficult   Most Recent Fall Screen:    11/04/2023    9:40 AM 04/01/2023    1:31 PM 05/13/2022   10:41 AM 04/15/2022    9:55 AM 12/11/2021  10:54 AM  Fall Risk   Falls in the past year? 1 0 1 1 0  Number falls in past yr: 1 0  1 0  Injury with Fall? 1 0 0 0 0  Comment feel through a porch      Risk for fall due to : No Fall Risks No Fall Risks  No Fall Risks Other (Comment);No Fall Risks  Follow up Falls evaluation completed Falls evaluation completed  Falls evaluation completed;Education provided Education provided;Falls evaluation completed    Past medical history, surgical history, medications, allergies, family history and social history reviewed with patient today and changes made to appropriate areas of the chart.  Past Medical  History:  Past Medical History:  Diagnosis Date   Abscess 02/12/2023   Asthma    Bilateral occipital neuralgia 08/13/2022   Candidal dermatitis 11/04/2021   Cornea abrasion    Diabetes mellitus    Migraines    Morbid obesity (HCC)    Medications:  Current Outpatient Medications on File Prior to Visit  Medication Sig   albuterol  (PROVENTIL ) (2.5 MG/3ML) 0.083% nebulizer solution Take 3 mLs (2.5 mg total) by nebulization every 4 (four) hours as needed for wheezing or shortness of breath.   albuterol  (VENTOLIN  HFA) 108 (90 Base) MCG/ACT inhaler Inhale 2 puffs into the lungs every 6 (six) hours as needed for wheezing or shortness of breath.   Azelastine -Fluticasone  (DYMISTA ) 137-50 MCG/ACT SUSP Place 1 spray into both nostrils 2 (two) times daily for congestion or nasal drainage.   bacitracin  500 UNIT/GM ointment Apply 1 Application topically 2 (two) times daily.   Budeson-Glycopyrrol-Formoterol  (BREZTRI  AEROSPHERE) 160-9-4.8 MCG/ACT AERO Inhale 2 puffs into the lungs in the morning and at bedtime.   Continuous Glucose Sensor (DEXCOM G7 SENSOR) MISC Change and apply one sensor to the abdomen every 10 days.   Continuous Glucose Sensor (DEXCOM G7 SENSOR) MISC Apply one sensor to the abdomen every 10 days.   cromolyn  (OPTICROM ) 4 % ophthalmic solution Place 1 drop into both eyes 4 (four) times daily as needed (itchy/watery eyes).   hydrOXYzine  (ATARAX ) 25 MG tablet TAKE 1 TABLET BY MOUTH EVERY 8 HOURS AS NEEDED.   insulin  glargine (LANTUS ) 100 unit/mL SOPN Inject 20 Units into the skin daily.   Insulin  Pen Needle 32G X 4 MM MISC 1 Device daily in the afternoon.   levocetirizine (XYZAL ) 5 MG tablet Take 1 tablet (5 mg total) by mouth 2 (two) times daily.   montelukast  (SINGULAIR ) 10 MG tablet Take 1 tablet (10 mg total) by mouth at bedtime.   norethindrone  (MICRONOR ) 0.35 MG tablet Take 1 tablet (0.35 mg total) by mouth daily.   nystatin  ointment (MYCOSTATIN ) Apply 1 Application topically 2  (two) times daily.   oxymetazoline  (AFRIN) 0.05 % nasal spray 2 sprays each nostril twice a day for next 3 days.  Wait until you can breathe through nose then use Dymista    Spacer/Aero-Holding Chambers (AEROCHAMBER PLUS WITH MASK) inhaler Use as directed with Metered Dose Inhaler   SUMAtriptan  (IMITREX ) 100 MG tablet Take 1 tablet (100 mg total) by mouth as needed for migraine. May repeat in 2 hours if headache persists or recurs.   tirzepatide  (MOUNJARO ) 5 MG/0.5ML Pen Inject 5 mg into the skin once a week.   valACYclovir  (VALTREX ) 1000 MG tablet Take 1 tablet (1,000 mg total) by mouth daily.   No current facility-administered medications on file prior to visit.   Surgical History:  Past Surgical History:  Procedure Laterality Date   CERVICAL  POLYPECTOMY     DILATION AND CURETTAGE OF UTERUS     Allergies:  Allergies  Allergen Reactions   Ceftriaxone Hives and Itching   Methocarbamol Itching and Rash   Sulfa Antibiotics Other (See Comments), Hives, Itching and Rash    Reaction unknown   Family History:  Family History  Problem Relation Age of Onset   Allergic rhinitis Mother    Diabetes Mother    Sinusitis Mother    Sinusitis Father    Allergic rhinitis Father    Diabetes Father    Sinusitis Sister    Allergic rhinitis Sister    Infertility Sister    Sinusitis Brother    Allergic rhinitis Brother    Sinusitis Brother    Asthma Brother    Allergic rhinitis Brother    Cancer Maternal Grandmother 19       unknown type, metastatic   Diabetes Paternal Grandmother    Asthma Maternal Aunt    COPD Maternal Aunt    Breast cancer Maternal Aunt 30 - 61       unilat. mast.   Cancer Other        unknown type; one dx in 5s   Immunodeficiency Neg Hx    Eczema Neg Hx    Angioedema Neg Hx    Atopy Neg Hx    Urticaria Neg Hx        Objective:    BP 128/80   Pulse 72   Ht 5\' 8"  (1.727 m)   Wt (!) 372 lb 9.6 oz (169 kg)   LMP  (LMP Unknown)   BMI 56.65 kg/m   Wt  Readings from Last 3 Encounters:  11/04/23 (!) 372 lb 9.6 oz (169 kg)  08/22/23 (!) 364 lb 4.8 oz (165.2 kg)  07/17/23 (!) 367 lb (166.5 kg)    Physical Exam Vitals and nursing note reviewed.  Constitutional:      Appearance: Normal appearance. She is obese.  HENT:     Head: Normocephalic.     Nose: Congestion present.     Mouth/Throat:     Mouth: Mucous membranes are moist.     Pharynx: Oropharynx is clear.  Eyes:     Conjunctiva/sclera: Conjunctivae normal.  Neck:     Vascular: No carotid bruit.  Cardiovascular:     Rate and Rhythm: Normal rate and regular rhythm.     Pulses: Normal pulses.     Heart sounds: Normal heart sounds.  Pulmonary:     Effort: Pulmonary effort is normal.     Breath sounds: Normal breath sounds.  Abdominal:     General: Bowel sounds are normal.     Palpations: Abdomen is soft.  Musculoskeletal:     Cervical back: Neck supple.     Right lower leg: No edema.     Left lower leg: No edema.  Lymphadenopathy:     Cervical: No cervical adenopathy.  Skin:    General: Skin is warm and dry.     Capillary Refill: Capillary refill takes less than 2 seconds.  Neurological:     General: No focal deficit present.     Mental Status: She is alert and oriented to person, place, and time.  Psychiatric:        Mood and Affect: Mood normal.        Behavior: Behavior normal.      Results for orders placed or performed in visit on 11/04/23  TSH   Collection Time: 11/04/23 11:24 AM  Result Value Ref  Range   TSH 1.390 0.450 - 4.500 uIU/mL  Hemoglobin A1c   Collection Time: 11/04/23 11:24 AM  Result Value Ref Range   Hgb A1c MFr Bld 6.8 (H) 4.8 - 5.6 %   Est. average glucose Bld gHb Est-mCnc 148 mg/dL  CBC with Differential/Platelet   Collection Time: 11/04/23 11:24 AM  Result Value Ref Range   WBC 8.4 3.4 - 10.8 x10E3/uL   RBC 4.39 3.77 - 5.28 x10E6/uL   Hemoglobin 13.3 11.1 - 15.9 g/dL   Hematocrit 16.1 09.6 - 46.6 %   MCV 91 79 - 97 fL   MCH 30.3  26.6 - 33.0 pg   MCHC 33.3 31.5 - 35.7 g/dL   RDW 04.5 40.9 - 81.1 %   Platelets 260 150 - 450 x10E3/uL   Neutrophils 58 Not Estab. %   Lymphs 34 Not Estab. %   Monocytes 7 Not Estab. %   Eos 1 Not Estab. %   Basos 0 Not Estab. %   Neutrophils Absolute 4.8 1.4 - 7.0 x10E3/uL   Lymphocytes Absolute 2.9 0.7 - 3.1 x10E3/uL   Monocytes Absolute 0.6 0.1 - 0.9 x10E3/uL   EOS (ABSOLUTE) 0.1 0.0 - 0.4 x10E3/uL   Basophils Absolute 0.0 0.0 - 0.2 x10E3/uL   Immature Granulocytes 0 Not Estab. %   Immature Grans (Abs) 0.0 0.0 - 0.1 x10E3/uL  Comprehensive metabolic panel with GFR   Collection Time: 11/04/23 11:24 AM  Result Value Ref Range   Glucose 93 70 - 99 mg/dL   BUN 8 6 - 24 mg/dL   Creatinine, Ser 9.14 (L) 0.57 - 1.00 mg/dL   eGFR 782 >95 AO/ZHY/8.65   BUN/Creatinine Ratio 15 9 - 23   Sodium 138 134 - 144 mmol/L   Potassium 4.2 3.5 - 5.2 mmol/L   Chloride 103 96 - 106 mmol/L   CO2 20 20 - 29 mmol/L   Calcium 9.0 8.7 - 10.2 mg/dL   Total Protein 6.8 6.0 - 8.5 g/dL   Albumin 4.0 3.9 - 4.9 g/dL   Globulin, Total 2.8 1.5 - 4.5 g/dL   Bilirubin Total 0.2 0.0 - 1.2 mg/dL   Alkaline Phosphatase 103 44 - 121 IU/L   AST 13 0 - 40 IU/L   ALT 15 0 - 32 IU/L  Iron, TIBC and Ferritin Panel   Collection Time: 11/04/23 11:24 AM  Result Value Ref Range   Total Iron Binding Capacity 275 250 - 450 ug/dL   UIBC 784 696 - 295 ug/dL   Iron 49 27 - 284 ug/dL   Iron Saturation 18 15 - 55 %   Ferritin 106 15 - 150 ng/mL  VITAMIN D 25 Hydroxy (Vit-D Deficiency, Fractures)   Collection Time: 11/04/23 11:24 AM  Result Value Ref Range   Vit D, 25-Hydroxy 12.6 (L) 30.0 - 100.0 ng/mL  Lipid panel   Collection Time: 11/04/23 11:24 AM  Result Value Ref Range   Cholesterol, Total 173 100 - 199 mg/dL   Triglycerides 132 0 - 149 mg/dL   HDL 42 >44 mg/dL   VLDL Cholesterol Cal 26 5 - 40 mg/dL   LDL Chol Calc (NIH) 010 (H) 0 - 99 mg/dL   Chol/HDL Ratio 4.1 0.0 - 4.4 ratio  QuantiFERON-TB Gold Plus    Collection Time: 11/04/23 11:24 AM  Result Value Ref Range   QuantiFERON Incubation WILL FOLLOW    QuantiFERON Criteria WILL FOLLOW    QuantiFERON TB1 Ag Value WILL FOLLOW    QuantiFERON TB2 Ag Value WILL  FOLLOW    QuantiFERON Nil Value WILL FOLLOW    QuantiFERON Mitogen Value WILL FOLLOW    QuantiFERON-TB Gold Plus WILL FOLLOW        Assessment & Plan:   Problem List Items Addressed This Visit     Class 3 severe obesity due to excess calories without serious comorbidity with body mass index (BMI) of 50.0 to 59.9 in adult   Managing diabetes with mounjaro , but no significant changes in weight. No routine exercise or strict dietary management. Discussed importance of both.  - Continue Mounjaro  once weekly. - Encourage regular eating habits to prevent hypoglycemia and weight gain. - Order labs to monitor blood glucose levels. - Provide list of low-carb, high-protein snacks.      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 No recent outbreaks reported. Has a sufficient supply of medication. - Continue valacyclovir  as needed.      Type 2 diabetes mellitus with diabetic dermatitis, with long-term current use of insulin  (HCC)   Type 2 diabetes mellitus with episodes of hypoglycemia, likely exacerbated by inadequate dietary intake. Mounjaro  is effective in maintaining blood glucose levels, but hypoglycemia occurs on days without insulin . Concerns about insurance coverage if insulin  is discontinued. - Continue Mounjaro  once weekly. - Maintain Lantus  as needed to ensure insurance coverage. - Encourage regular eating habits to prevent hypoglycemia. - Order labs to monitor blood glucose levels. - Provide list of low-carb, high-protein snacks.      Relevant Medications   ondansetron  (ZOFRAN -ODT) 4 MG disintegrating tablet   Other Relevant Orders    Hemoglobin A1c (Completed)   Allergic rhinitis   Severe allergic rhinitis with multiple allergens identified, including trees, grass, mold, and pet dander. Symptoms include nasal congestion and thick mucus production. Environmental factors such as mold in the previous apartment may have contributed to symptoms. - Continue Xyzal  and montelukast . - Use Dymista  for nasal congestion.       Relevant Orders   CBC with Differential/Platelet (Completed)   Mood changes   Depression exacerbated by financial stress and health issues. Reports feeling depressed due to inability to afford living expenses and health concerns. Plans to move to [place]  for better support and financial relief.      Achilles tendinitis of left lower extremity   Achilles tendonitis with pain and popping sensation in the ankle, likely due to lack of proper footwear and support. Symptoms improve with stretching and supportive footwear. - Encourage wearing supportive footwear. - Perform stretching exercises for the Achilles tendon. - Apply ice to the affected area as needed.      Encounter for annual physical exam - Primary   Screening for tuberculosis   Relevant Orders   QuantiFERON-TB Gold Plus (Completed)   Chronic bilateral low back pain without sciatica   Relevant Medications   diclofenac  (VOLTAREN ) 50 MG EC tablet   Osteoarthritis   Relevant Medications   diclofenac  (VOLTAREN ) 50 MG EC tablet   Gastroesophageal reflux disease   Relevant Medications   pantoprazole  (PROTONIX ) 40 MG tablet   ondansetron  (ZOFRAN -ODT) 4 MG disintegrating tablet   Other Relevant Orders   Hemoglobin A1c (Completed)   Other Visit Diagnoses       Decreased appetite       Relevant Orders   Hemoglobin A1c (Completed)   CBC with Differential/Platelet (Completed)   Comprehensive metabolic panel with GFR (Completed)  Iron, TIBC and Ferritin Panel (Completed)   VITAMIN D 25 Hydroxy (Vit-D Deficiency, Fractures) (Completed)      Vitamin D deficiency       Relevant Orders   VITAMIN D 25 Hydroxy (Vit-D Deficiency, Fractures) (Completed)     Other fatigue             Follow up plan: Return for Patient moving out of state.  NEXT PREVENTATIVE PHYSICAL DUE IN 1 YEAR.  PATIENT COUNSELING PROVIDED FOR ALL ADULT PATIENTS: A well balanced diet low in saturated fats, cholesterol, and moderation in carbohydrates.  This can be as simple as monitoring portion sizes and cutting back on sugary beverages such as soda and juice to start with.    Daily water consumption of at least 64 ounces.  Physical activity at least 180 minutes per week.  If just starting out, start 10 minutes a day and work your way up.   This can be as simple as taking the stairs instead of the elevator and walking 2-3 laps around the office  purposefully every day.   STD protection, partner selection, and regular testing if high risk.  Limited consumption of alcoholic beverages if alcohol is consumed. For men, I recommend no more than 14 alcoholic beverages per week, spread out throughout the week (max 2 per day). Avoid "binge" drinking or consuming large quantities of alcohol in one setting.  Please let me know if you feel you may need help with reduction or quitting alcohol consumption.   Avoidance of nicotine, if used. Please let me know if you feel you may need help with reduction or quitting nicotine use.   Daily mental health attention. This can be in the form of 5 minute daily meditation, prayer, journaling, yoga, reflection, etc.  Purposeful attention to your emotions and mental state can significantly improve your overall wellbeing  and  Health.  Please know that I am here to help you with all of your health care goals and am happy to work with you to find a solution that works best for you.  The greatest advice I have received with any changes in life are to take it one step at a time, that even means if all you can focus on is the next  60 seconds, then do that and celebrate your victories.  With any changes in life, you will have set backs, and that is OK. The important thing to remember is, if you have a set back, it is not a failure, it is an opportunity to try again! Screening Testing Mammogram Every 1 -2 years based on history and risk factors Starting at age 77 Pap Smear Ages 21-39 every 3 years Ages 78-65 every 5 years with HPV testing More frequent testing may be required based on results and history Colon Cancer Screening Every 1-10 years based on test performed, risk factors, and history Starting at age 67 Bone Density Screening Every 2-10 years based on history Starting at age 19 for women Recommendations for men differ based on medication usage, history, and risk factors AAA Screening One time ultrasound Men 66-68 years old who have every smoked Lung Cancer Screening Low Dose Lung CT every 12 months Age 56-80 years with a 30 pack-year smoking history who still smoke or who have quit within the last 15 years   Screening Labs Routine  Labs: Complete Blood Count (CBC), Complete Metabolic Panel (CMP), Cholesterol (Lipid Panel) Every 6-12 months based on history and medications May be recommended more frequently  based on current conditions or previous results Hemoglobin A1c Lab Every 3-12 months based on history and previous results Starting at age 73 or earlier with diagnosis of diabetes, high cholesterol, BMI >26, and/or risk factors Frequent monitoring for patients with diabetes to ensure blood sugar control Thyroid Panel (TSH) Every 6 months based on history, symptoms, and risk factors May be repeated more often if on medication HIV One time testing for all patients 28 and older May be repeated more frequently for patients with increased risk factors or exposure Hepatitis C One time testing for all patients 10 and older May be repeated more frequently for patients with increased risk factors or  exposure Gonorrhea, Chlamydia Every 12 months for all sexually active persons 13-24 years Additional monitoring may be recommended for those who are considered high risk or who have symptoms Every 12 months for any woman on birth control, regardless of sexual activity PSA Men 37-87 years old with risk factors Additional screening may be recommended from age 43-69 based on risk factors, symptoms, and history  Vaccine Recommendations Tetanus Booster All adults every 10 years Flu Vaccine All patients 6 months and older every year COVID Vaccine All patients 12 years and older Initial dosing with booster May recommend additional booster based on age and health history HPV Vaccine 2 doses all patients age 54-26 Dosing may be considered for patients over 26 Shingles Vaccine (Shingrix) 2 doses all adults 55 years and older Pneumonia (Pneumovax 44) All adults 65 years and older May recommend earlier dosing based on health history One year apart from Prevnar 88 Pneumonia (Prevnar 57) All adults 65 years and older Dosed 1 year after Pneumovax 23 Pneumonia (Prevnar 20) One time alternative to the two dosing of 13 and 23 For all adults with initial dose of 23, 20 is recommended 1 year later For all adults with initial dose of 13, 23 is still recommended as second option 1 year later

## 2023-11-05 ENCOUNTER — Other Ambulatory Visit (HOSPITAL_COMMUNITY): Payer: Self-pay

## 2023-11-05 DIAGNOSIS — R4586 Emotional lability: Secondary | ICD-10-CM | POA: Insufficient documentation

## 2023-11-05 DIAGNOSIS — M7662 Achilles tendinitis, left leg: Secondary | ICD-10-CM | POA: Insufficient documentation

## 2023-11-05 NOTE — Assessment & Plan Note (Signed)
 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 No recent outbreaks reported. Has a sufficient supply of medication. - Continue valacyclovir  as needed.

## 2023-11-05 NOTE — Assessment & Plan Note (Signed)
 Type 2 diabetes mellitus with episodes of hypoglycemia, likely exacerbated by inadequate dietary intake. Mounjaro  is effective in maintaining blood glucose levels, but hypoglycemia occurs on days without insulin . Concerns about insurance coverage if insulin  is discontinued. - Continue Mounjaro  once weekly. - Maintain Lantus  as needed to ensure insurance coverage. - Encourage regular eating habits to prevent hypoglycemia. - Order labs to monitor blood glucose levels. - Provide list of low-carb, high-protein snacks.

## 2023-11-05 NOTE — Assessment & Plan Note (Signed)
 Severe allergic rhinitis with multiple allergens identified, including trees, grass, mold, and pet dander. Symptoms include nasal congestion and thick mucus production. Environmental factors such as mold in the previous apartment may have contributed to symptoms. - Continue Xyzal  and montelukast . - Use Dymista  for nasal congestion.

## 2023-11-05 NOTE — Assessment & Plan Note (Signed)
 Managing diabetes with mounjaro , but no significant changes in weight. No routine exercise or strict dietary management. Discussed importance of both.  - Continue Mounjaro  once weekly. - Encourage regular eating habits to prevent hypoglycemia and weight gain. - Order labs to monitor blood glucose levels. - Provide list of low-carb, high-protein snacks.

## 2023-11-05 NOTE — Assessment & Plan Note (Signed)
 Achilles tendonitis with pain and popping sensation in the ankle, likely due to lack of proper footwear and support. Symptoms improve with stretching and supportive footwear. - Encourage wearing supportive footwear. - Perform stretching exercises for the Achilles tendon. - Apply ice to the affected area as needed.

## 2023-11-05 NOTE — Assessment & Plan Note (Signed)
 Depression exacerbated by financial stress and health issues. Reports feeling depressed due to inability to afford living expenses and health concerns. Plans to move to [place]  for better support and financial relief.

## 2023-11-06 ENCOUNTER — Encounter: Payer: Self-pay | Admitting: Nurse Practitioner

## 2023-11-06 ENCOUNTER — Other Ambulatory Visit (HOSPITAL_BASED_OUTPATIENT_CLINIC_OR_DEPARTMENT_OTHER): Payer: Self-pay

## 2023-11-06 ENCOUNTER — Other Ambulatory Visit: Payer: Self-pay | Admitting: Nurse Practitioner

## 2023-11-06 ENCOUNTER — Other Ambulatory Visit (HOSPITAL_COMMUNITY): Payer: Self-pay

## 2023-11-06 DIAGNOSIS — E559 Vitamin D deficiency, unspecified: Secondary | ICD-10-CM

## 2023-11-06 MED ORDER — VITAMIN D (ERGOCALCIFEROL) 1.25 MG (50000 UNIT) PO CAPS: 50000.0000 [IU] | ORAL_CAPSULE | ORAL | 3 refills | Status: AC

## 2023-11-06 NOTE — Telephone Encounter (Signed)
 Pharmacy Patient Advocate Encounter  Received notification from [facility] Medicare Part D that Prior Authorization for Dexcom G7 Sensor has been APPROVED from 10-09-2023 to 06-30-2024   PA #/Case ID/Reference #: [id number]

## 2023-11-07 LAB — CBC WITH DIFFERENTIAL/PLATELET
Basophils Absolute: 0 10*3/uL (ref 0.0–0.2)
Basos: 0 %
EOS (ABSOLUTE): 0.1 10*3/uL (ref 0.0–0.4)
Eos: 1 %
Hematocrit: 39.9 % (ref 34.0–46.6)
Hemoglobin: 13.3 g/dL (ref 11.1–15.9)
Immature Grans (Abs): 0 10*3/uL (ref 0.0–0.1)
Immature Granulocytes: 0 %
Lymphocytes Absolute: 2.9 10*3/uL (ref 0.7–3.1)
Lymphs: 34 %
MCH: 30.3 pg (ref 26.6–33.0)
MCHC: 33.3 g/dL (ref 31.5–35.7)
MCV: 91 fL (ref 79–97)
Monocytes Absolute: 0.6 10*3/uL (ref 0.1–0.9)
Monocytes: 7 %
Neutrophils Absolute: 4.8 10*3/uL (ref 1.4–7.0)
Neutrophils: 58 %
Platelets: 260 10*3/uL (ref 150–450)
RBC: 4.39 x10E6/uL (ref 3.77–5.28)
RDW: 13.2 % (ref 11.7–15.4)
WBC: 8.4 10*3/uL (ref 3.4–10.8)

## 2023-11-07 LAB — COMPREHENSIVE METABOLIC PANEL WITH GFR
ALT: 15 IU/L (ref 0–32)
AST: 13 IU/L (ref 0–40)
Albumin: 4 g/dL (ref 3.9–4.9)
Alkaline Phosphatase: 103 IU/L (ref 44–121)
BUN/Creatinine Ratio: 15 (ref 9–23)
BUN: 8 mg/dL (ref 6–24)
Bilirubin Total: 0.2 mg/dL (ref 0.0–1.2)
CO2: 20 mmol/L (ref 20–29)
Calcium: 9 mg/dL (ref 8.7–10.2)
Chloride: 103 mmol/L (ref 96–106)
Creatinine, Ser: 0.54 mg/dL — ABNORMAL LOW (ref 0.57–1.00)
Globulin, Total: 2.8 g/dL (ref 1.5–4.5)
Glucose: 93 mg/dL (ref 70–99)
Potassium: 4.2 mmol/L (ref 3.5–5.2)
Sodium: 138 mmol/L (ref 134–144)
Total Protein: 6.8 g/dL (ref 6.0–8.5)
eGFR: 119 mL/min/{1.73_m2} (ref >59)

## 2023-11-07 LAB — QUANTIFERON-TB GOLD PLUS
QuantiFERON Mitogen Value: >10 [IU]/mL
QuantiFERON Nil Value: 0.04 [IU]/mL
QuantiFERON TB1 Ag Value: 0.06 [IU]/mL
QuantiFERON TB2 Ag Value: 0.05 [IU]/mL
QuantiFERON-TB Gold Plus: NEGATIVE

## 2023-11-07 LAB — LIPID PANEL
Chol/HDL Ratio: 4.1 ratio (ref 0.0–4.4)
Cholesterol, Total: 173 mg/dL (ref 100–199)
HDL: 42 mg/dL (ref >39)
LDL Chol Calc (NIH): 105 mg/dL — ABNORMAL HIGH (ref 0–99)
Triglycerides: 145 mg/dL (ref 0–149)
VLDL Cholesterol Cal: 26 mg/dL (ref 5–40)

## 2023-11-07 LAB — IRON,TIBC AND FERRITIN PANEL
Ferritin: 106 ng/mL (ref 15–150)
Iron Saturation: 18 % (ref 15–55)
Iron: 49 ug/dL (ref 27–159)
Total Iron Binding Capacity: 275 ug/dL (ref 250–450)
UIBC: 226 ug/dL (ref 131–425)

## 2023-11-07 LAB — VITAMIN D 25 HYDROXY (VIT D DEFICIENCY, FRACTURES): Vit D, 25-Hydroxy: 12.6 ng/mL — ABNORMAL LOW (ref 30.0–100.0)

## 2023-11-07 LAB — HEMOGLOBIN A1C
Est. average glucose Bld gHb Est-mCnc: 148 mg/dL
Hgb A1c MFr Bld: 6.8 % — ABNORMAL HIGH (ref 4.8–5.6)

## 2023-11-07 LAB — TSH: TSH: 1.39 u[IU]/mL (ref 0.450–4.500)

## 2023-11-11 ENCOUNTER — Other Ambulatory Visit (HOSPITAL_COMMUNITY): Payer: Self-pay

## 2023-11-11 NOTE — Telephone Encounter (Unsigned)
 Copied from CRM [id number]. Topic: Clinical - Medication Refill >> Nov 11, 2023 12:22 PM Alpha Arts wrote: Medication: Continuous Glucose Sensor (DEXCOM G7 SENSOR) MISC  insulin  glargine (LANTUS ) 100 unit/mL SOPN  ondansetron  (ZOFRAN -ODT) 4 MG disintegrating tablet  Azelastine -Fluticasone  (DYMISTA ) 137-50 MCG/ACT SUSP   Has the patient contacted their pharmacy? Yes (Agent: If no, request that the patient contact the pharmacy for the refill. If patient does not wish to contact the pharmacy document the reason why and proceed with request.) (Agent: If yes, when and what did the pharmacy advise?)  This is the patient's preferred pharmacy:   [id number] [place], [place] - [address] [facility] [place] [address] Phone: [phone] Fax: [phone]  Is this the correct pharmacy for this prescription? Yes If no, delete pharmacy and type the correct one.   Has the prescription been filled recently? Yes  Is the patient out of the medication? Yes  Has the patient been seen for an appointment in the last year OR does the patient have an upcoming appointment? Yes  Can we respond through MyChart? Yes  Agent: Please be advised that Rx refills may take up to 3 business days. We ask that you follow-up with your pharmacy.

## 2023-11-11 NOTE — Telephone Encounter (Signed)
 Copied from CRM [id number]. Topic: Clinical - Medication Refill >> Nov 11, 2023 12:08 PM Alpha Arts wrote: Medication: Continuous Glucose Sensor (DEXCOM G7 SENSOR) MISC  insulin  glargine (LANTUS ) 100 unit/mL SOPN  ondansetron  (ZOFRAN -ODT) 4 MG disintegrating tablet  Azelastine -Fluticasone  (DYMISTA ) 137-50 MCG/ACT SUSP   Has the patient contacted their pharmacy? Yes (Agent: If no, request that the patient contact the pharmacy for the refill. If patient does not wish to contact the pharmacy document the reason why and proceed with request.) (Agent: If yes, when and what did the pharmacy advise?)  This is the patient's preferred pharmacy:     [id number] - [address] [place] [address] Phone: [phone] Fax: [phone]  Is this the correct pharmacy for this prescription? Yes If no, delete pharmacy and type the correct one.   Has the prescription been filled recently? Yes  Is the patient out of the medication? Yes  Has the patient been seen for an appointment in the last year OR does the patient have an upcoming appointment? Yes  Can we respond through MyChart? Yes  Agent: Please be advised that Rx refills may take up to 3 business days. We ask that you follow-up with your pharmacy.

## 2023-11-11 NOTE — Telephone Encounter (Signed)
 Copied from CRM [id number]. Topic: Clinical - Medication Refill >> Nov 11, 2023 12:03 PM Alpha Arts wrote: Medication: Continuous Glucose Sensor (DEXCOM G7 SENSOR) MISC  insulin  glargine (LANTUS ) 100 unit/mL SOPN  ondansetron  (ZOFRAN -ODT) 4 MG disintegrating tablet  Azelastine -Fluticasone  (DYMISTA ) 137-50 MCG/ACT SUSP   Has the patient contacted their pharmacy? Yes (Agent: If no, request that the patient contact the pharmacy for the refill. If patient does not wish to contact the pharmacy document the reason why and proceed with request.) (Agent: If yes, when and what did the pharmacy advise?)  This is the patient's preferred pharmacy:  [id number] - [place], [place] - [address] [facility] [address] [place] [address] Phone: [phone] Fax: [phone]  [facility] - [facility] [address] [place] [address] Phone: [phone] Fax: [phone]  [id number], [place] - [address] [place] [address] Phone: [phone] Fax: [phone]  Is this the correct pharmacy for this prescription? Yes If no, delete pharmacy and type the correct one.   Has the prescription been filled recently? Yes  Is the patient out of the medication? Yes  Has the patient been seen for an appointment in the last year OR does the patient have an upcoming appointment? Yes  Can we respond through MyChart? Yes  Agent: Please be advised that Rx refills may take up to 3 business days. We ask that you follow-up with your pharmacy.

## 2023-11-12 ENCOUNTER — Other Ambulatory Visit: Payer: Self-pay

## 2023-11-12 ENCOUNTER — Other Ambulatory Visit (HOSPITAL_COMMUNITY): Payer: Self-pay

## 2023-11-14 ENCOUNTER — Other Ambulatory Visit: Payer: Self-pay

## 2023-11-14 MED ORDER — AZELASTINE HCL 0.1 % NA SOLN: 2.0000 | Freq: Two times a day (BID) | NASAL | 2 refills | Status: DC

## 2023-11-14 MED ORDER — CROMOLYN SODIUM 4 % OP SOLN
1.0000 [drp] | Freq: Four times a day (QID) | OPHTHALMIC | 4 refills | Status: AC | PRN
Start: 2023-11-14 — End: ?

## 2023-11-14 MED ORDER — LEVOCETIRIZINE DIHYDROCHLORIDE 5 MG PO TABS: 5.0000 mg | ORAL_TABLET | Freq: Two times a day (BID) | ORAL | 1 refills | Status: DC

## 2023-11-14 MED ORDER — FLUTICASONE PROPIONATE 50 MCG/ACT NA SUSP: 2.0000 | Freq: Every day | NASAL | 4 refills | Status: DC

## 2023-11-14 MED ORDER — MONTELUKAST SODIUM 10 MG PO TABS: 10.0000 mg | ORAL_TABLET | Freq: Every day | ORAL | 3 refills | Status: AC

## 2023-11-14 MED ORDER — ALBUTEROL SULFATE HFA 108 (90 BASE) MCG/ACT IN AERS: 2.0000 | INHALATION_SPRAY | Freq: Four times a day (QID) | RESPIRATORY_TRACT | 1 refills | Status: DC | PRN

## 2023-11-17 ENCOUNTER — Other Ambulatory Visit: Payer: Self-pay

## 2023-11-17 ENCOUNTER — Other Ambulatory Visit (HOSPITAL_COMMUNITY): Payer: Self-pay

## 2023-11-18 ENCOUNTER — Ambulatory Visit (INDEPENDENT_AMBULATORY_CARE_PROVIDER_SITE_OTHER)

## 2023-11-18 VITALS — BP 138/70 | HR 97 | Temp 97.8°F | Ht 68.0 in | Wt 370.8 lb

## 2023-11-18 DIAGNOSIS — H5213 Myopia, bilateral: Secondary | ICD-10-CM | POA: Diagnosis not present

## 2023-11-18 DIAGNOSIS — Z0000 Encounter for general adult medical examination without abnormal findings: Secondary | ICD-10-CM

## 2023-11-18 NOTE — Progress Notes (Signed)
 Subjective:   Monica Hunter is a 43 y.o. who presents for a Medicare Wellness preventive visit.  As a reminder, Annual Wellness Visits don't include a physical exam, and some assessments may be limited, especially if this visit is performed virtually. We may recommend an in-person follow-up visit with your provider if needed.  Visit Complete: In person    Persons Participating in Visit: Patient.  AWV Questionnaire: No: Patient Medicare AWV questionnaire was not completed prior to this visit.  Cardiac Risk Factors include: diabetes mellitus;obesity (BMI >30kg/m2)     Objective:     Today's Vitals   11/18/23 1531 11/18/23 1534  BP: 138/70   Pulse: 97   Temp: 97.8 F (36.6 C)   TempSrc: Oral   SpO2: 97%   Weight: (!) 370 lb 12.8 oz (168.2 kg)   Height: 5\' 8"  (1.727 m)   PainSc:  8    Body mass index is 56.38 kg/m.     11/18/2023    3:53 PM 08/29/2022    2:06 PM 07/07/2022   12:28 AM 07/28/2016    1:31 AM  Advanced Directives  Does Patient Have a Medical Advance Directive? Yes No No No  Type of Estate agent of [place];Living will     Copy of Healthcare Power of Attorney in Chart? No - copy requested     Would patient like information on creating a medical advance directive?  No - Patient declined  Yes (MAU/Ambulatory/Procedural Areas - Information given)    Current Medications (verified) Outpatient Encounter Medications as of 11/18/2023  Medication Sig   albuterol  (PROVENTIL ) (2.5 MG/3ML) 0.083% nebulizer solution Take 3 mLs (2.5 mg total) by nebulization every 4 (four) hours as needed for wheezing or shortness of breath.   albuterol  (VENTOLIN  HFA) 108 (90 Base) MCG/ACT inhaler Inhale 2 puffs into the lungs every 6 (six) hours as needed for wheezing or shortness of breath.   azelastine  (ASTELIN ) 0.1 % nasal spray Place 2 sprays into both nostrils 2 (two) times daily. Use in each nostril as directed   Azelastine -Fluticasone  (DYMISTA ) 137-50  MCG/ACT SUSP Place 1 spray into both nostrils 2 (two) times daily for congestion or nasal drainage.   bacitracin  500 UNIT/GM ointment Apply 1 Application topically 2 (two) times daily.   Budeson-Glycopyrrol-Formoterol  (BREZTRI  AEROSPHERE) 160-9-4.8 MCG/ACT AERO Inhale 2 puffs into the lungs in the morning and at bedtime.   Continuous Glucose Sensor (DEXCOM G7 SENSOR) MISC Change and apply one sensor to the abdomen every 10 days.   Continuous Glucose Sensor (DEXCOM G7 SENSOR) MISC Apply one sensor to the abdomen every 10 days.   cromolyn  (OPTICROM ) 4 % ophthalmic solution Place 1 drop into both eyes 4 (four) times daily as needed (itchy/watery eyes).   diclofenac  (VOLTAREN ) 50 MG EC tablet Take 2 tablets (100 mg total) by mouth in the morning AND 1 tablet (50 mg total) at bedtime as needed for pain.   fluticasone  (FLONASE ) 50 MCG/ACT nasal spray Place 2 sprays into both nostrils daily.   hydrOXYzine  (ATARAX ) 25 MG tablet TAKE 1 TABLET BY MOUTH EVERY 8 HOURS AS NEEDED.   insulin  glargine (LANTUS ) 100 unit/mL SOPN Inject 20 Units into the skin daily.   Insulin  Pen Needle 32G X 4 MM MISC 1 Device daily in the afternoon.   levocetirizine (XYZAL ) 5 MG tablet Take 1 tablet (5 mg total) by mouth 2 (two) times daily.   montelukast  (SINGULAIR ) 10 MG tablet Take 1 tablet (10 mg total) by mouth at bedtime.  norethindrone  (MICRONOR ) 0.35 MG tablet Take 1 tablet (0.35 mg total) by mouth daily.   nystatin  ointment (MYCOSTATIN ) Apply 1 Application topically 2 (two) times daily.   ondansetron  (ZOFRAN -ODT) 4 MG disintegrating tablet Take 1 tablet (4 mg total) by mouth every 8 (eight) hours as needed for nausea or vomiting.   oxymetazoline  (AFRIN) 0.05 % nasal spray 2 sprays each nostril twice a day for next 3 days.  Wait until you can breathe through nose then use Dymista    pantoprazole  (PROTONIX ) 40 MG tablet Take 1 tablet (40 mg total) by mouth daily.   Spacer/Aero-Holding Chambers (AEROCHAMBER PLUS WITH MASK)  inhaler Use as directed with Metered Dose Inhaler   SUMAtriptan  (IMITREX ) 100 MG tablet Take 1 tablet (100 mg total) by mouth as needed for migraine. May repeat in 2 hours if headache persists or recurs.   tirzepatide  (MOUNJARO ) 5 MG/0.5ML Pen Inject 5 mg into the skin once a week.   valACYclovir  (VALTREX ) 1000 MG tablet Take 1 tablet (1,000 mg total) by mouth daily.   Vitamin D , Ergocalciferol , (DRISDOL ) 1.25 MG (50000 UNIT) CAPS capsule Take 1 capsule (50,000 Units total) by mouth every 7 (seven) days. (Patient not taking: Reported on 11/18/2023)   No facility-administered encounter medications on file as of 11/18/2023.    Allergies (verified) Ceftriaxone, Methocarbamol, and Sulfa antibiotics   History: Past Medical History:  Diagnosis Date   Abscess 02/12/2023   Allergy    Anxiety    Arthritis    Asthma    Bilateral occipital neuralgia 08/13/2022   Candidal dermatitis 11/04/2021   Cornea abrasion    Depression    Diabetes mellitus    GERD (gastroesophageal reflux disease)    Migraines    Morbid obesity (HCC)    Past Surgical History:  Procedure Laterality Date   CERVICAL POLYPECTOMY     DILATION AND CURETTAGE OF UTERUS     Family History  Problem Relation Age of Onset   Allergic rhinitis Mother    Diabetes Mother    Sinusitis Mother    Arthritis Mother    Sinusitis Father    Allergic rhinitis Father    Diabetes Father    Sinusitis Sister    Allergic rhinitis Sister    Infertility Sister    Sinusitis Brother    Allergic rhinitis Brother    Sinusitis Brother    Asthma Brother    Allergic rhinitis Brother    Cancer Maternal Grandmother 58       unknown type, metastatic   Diabetes Paternal Grandmother    Asthma Maternal Aunt    COPD Maternal Aunt    Breast cancer Maternal Aunt 30 - 50       unilat. mast.   Cancer Other        unknown type; one dx in 31s   Immunodeficiency Neg Hx    Eczema Neg Hx    Angioedema Neg Hx    Atopy Neg Hx    Urticaria Neg Hx     Social History   Socioeconomic History   Marital status: Single    Spouse name: Not on file   Number of children: Not on file   Years of education: Not on file   Highest education level: Associate degree: occupational, Scientist, product/process development, or vocational program  Occupational History   Not on file  Tobacco Use   Smoking status: Every Day    Current packs/day: 0.50    Types: Cigarettes    Passive exposure: Current   Smokeless tobacco: Never  Tobacco comments:    Smoking since age 8   Vaping Use   Vaping status: Never Used  Substance and Sexual Activity   Alcohol use: No   Drug use: No   Sexual activity: Yes  Other Topics Concern   Not on file  Social History Narrative   Not on file   Social Drivers of Health   Financial Resource Strain: Medium Risk (11/18/2023)   Overall Financial Resource Strain (CARDIA)    Difficulty of Paying Living Expenses: Somewhat hard  Food Insecurity: Food Insecurity Present (11/18/2023)   Hunger Vital Sign    Worried About Running Out of Food in the Last Year: Sometimes true    Ran Out of Food in the Last Year: Sometimes true  Transportation Needs: No Transportation Needs (11/18/2023)   PRAPARE - Administrator, Civil Service (Medical): No    Lack of Transportation (Non-Medical): No  Physical Activity: Insufficiently Active (11/18/2023)   Exercise Vital Sign    Days of Exercise per Week: 3 days    Minutes of Exercise per Session: 20 min  Stress: Stress Concern Present (11/18/2023)   [employer] of Occupational Health - Occupational Stress Questionnaire    Feeling of Stress : To some extent  Social Connections: Socially Isolated (11/18/2023)   Social Connection and Isolation Panel [NHANES]    Frequency of Communication with Friends and Family: More than three times a week    Frequency of Social Gatherings with Friends and Family: More than three times a week    Attends Religious Services: Never    Database administrator or  Organizations: No    Attends Engineer, structural: Never    Marital Status: Never married    Tobacco Counseling Ready to quit: Not Answered Counseling given: Not Answered Tobacco comments: Smoking since age 54     Clinical Intake:  Pre-visit preparation completed: Yes  Pain : 0-10 Pain Score: 8  Pain Type: Chronic pain Pain Location: Hip Pain Orientation: Right, Left Pain Radiating Towards: down to feet Pain Descriptors / Indicators: Aching Pain Onset: More than a month ago Pain Frequency: Constant     Nutritional Status: BMI > 30  Obese Nutritional Risks: Nausea/ vomitting/ diarrhea (normal nausea) Diabetes: Yes CBG done?: No Did pt. bring in CBG monitor from home?: No  Lab Results  Component Value Date   HGBA1C 6.8 (H) 11/04/2023   HGBA1C 6.9 (A) 07/17/2023   HGBA1C 9.3 (H) 05/15/2023     How often do you need to have someone help you when you read instructions, pamphlets, or other written materials from your doctor or pharmacy?: 1 - Never  Interpreter Needed?: No  Information entered by :: NAllen LPN   Activities of Daily Living     11/18/2023    3:38 PM  In your present state of health, do you have any difficulty performing the following activities:  Hearing? 0  Vision? 0  Difficulty concentrating or making decisions? 1  Walking or climbing stairs? 1  Dressing or bathing? 0  Doing errands, shopping? 0  Preparing Food and eating ? N  Using the Toilet? N  In the past six months, have you accidently leaked urine? Y  Comment overactive bladder  Do you have problems with loss of bowel control? N  Managing your Medications? N  Managing your Finances? N  Housekeeping or managing your Housekeeping? N    Patient Care Team: Early, Sara E, NP as PCP - General (Nurse Practitioner)  Randa Burton  any recent Medical Services you may have received from other than [facility] providers in the past year (date may be approximate).     Assessment:    This is  a routine wellness examination for [place].  Hearing/Vision screen Hearing Screening - Comments:: Denies hearing issues Vision Screening - Comments:: Regular eye exams, [place]   Goals Addressed             This Visit's Progress    Patient Stated       11/18/2023, wants to go back to work       Depression Screen     11/18/2023    4:02 PM 11/04/2023    9:41 AM 04/01/2023    1:31 PM 12/30/2022    2:25 PM 09/05/2022    3:34 PM 06/17/2022    2:38 PM 05/13/2022   11:15 AM  PHQ 2/9 Scores  PHQ - 2 Score 1 1 0 0 0 0 2  PHQ- 9 Score       9    Fall Risk     11/18/2023    3:55 PM 11/04/2023    9:40 AM 04/01/2023    1:31 PM 05/13/2022   10:41 AM 04/15/2022    9:55 AM  Fall Risk   Falls in the past year? 1 1 0 1 1  Comment fell through a deck, knees give out      Number falls in past yr: 1 1 0  1  Injury with Fall? 1 1 0 0 0  Comment  feel through a porch     Risk for fall due to : Impaired mobility;Impaired balance/gait;History of fall(s);Medication side effect No Fall Risks No Fall Risks  No Fall Risks  Follow up Falls evaluation completed;Falls prevention discussed Falls evaluation completed Falls evaluation completed  Falls evaluation completed;Education provided    MEDICARE RISK AT HOME:  Medicare Risk at Home Any stairs in or around the home?: No If so, are there any without handrails?: No Home free of loose throw rugs in walkways, pet beds, electrical cords, etc?: Yes Adequate lighting in your home to reduce risk of falls?: Yes Life alert?: No Use of a cane, walker or w/c?: No Grab bars in the bathroom?: No Shower chair or bench in shower?: No Elevated toilet seat or a handicapped toilet?: No  TIMED UP AND GO:  Was the test performed?  Yes  Length of time to ambulate 10 feet: 6 sec Gait slow and steady without use of assistive device  Cognitive Function: 6CIT completed        11/18/2023    4:03 PM  6CIT Screen  What Year? 0 points  What month? 0 points   What time? 0 points  Count back from 20 0 points  Months in reverse 0 points  Repeat phrase 0 points  Total Score 0 points    Immunizations Immunization History  Administered Date(s) Administered   Influenza,inj,Quad PF,6+ Mos 04/17/2022   PFIZER(Purple Top)SARS-COV-2 Vaccination 04/27/2020, 05/18/2020   PPD Test 04/22/2016, 04/22/2016, 06/04/2018, 06/04/2018, 08/06/2022    Screening Tests Health Maintenance  Topic Date Due   DTaP/Tdap/Td (1 - Tdap) Never done   Pneumococcal Vaccine 78-24 Years old (1 of 2 - PCV) Never done   COVID-19 Vaccine (3 - 2024-25 season) 03/02/2023   OPHTHALMOLOGY EXAM  03/28/2023   INFLUENZA VACCINE  01/30/2024   HEMOGLOBIN A1C  05/06/2024   Diabetic kidney evaluation - Urine ACR  07/16/2024   FOOT EXAM  07/16/2024   Diabetic kidney  evaluation - eGFR measurement  11/03/2024   Medicare Annual Wellness (AWV)  11/17/2024   Cervical Cancer Screening (HPV/Pap Cotest)  03/06/2027   Hepatitis C Screening  Completed   HIV Screening  Completed   HPV VACCINES  Aged Out   Meningococcal B Vaccine  Aged Out    Health Maintenance  Health Maintenance Due  Topic Date Due   DTaP/Tdap/Td (1 - Tdap) Never done   Pneumococcal Vaccine 39-67 Years old (1 of 2 - PCV) Never done   COVID-19 Vaccine (3 - 2024-25 season) 03/02/2023   OPHTHALMOLOGY EXAM  03/28/2023   Health Maintenance Items Addressed: Declines vaccines. [facility] exam [facility]  Additional Screening:  Vision Screening: Recommended annual ophthalmology exams for early detection of glaucoma and other disorders of the eye.  Dental Screening: Recommended annual dental exams for proper oral hygiene  Community Resource Referral / Chronic Care Management: CRR required this visit?  No   CCM required this visit?  No   Plan:    I have personally reviewed and noted the following in the patient's chart:   Medical and social history Use of alcohol, tobacco or illicit drugs  Current  medications and supplements including opioid prescriptions. Patient is not currently taking opioid prescriptions. Functional ability and status Nutritional status Physical activity Advanced directives List of other physicians Hospitalizations, surgeries, and ER visits in previous 12 months Vitals Screenings to include cognitive, depression, and falls Referrals and appointments  In addition, I have reviewed and discussed with patient certain preventive protocols, quality metrics, and best practice recommendations. A written personalized care plan for preventive services as well as general preventive health recommendations were provided to patient.   Areatha Beecham, LPN   10/07/8117   After Visit Summary: (In Person-Printed) AVS printed and given to the patient  Notes: Nothing significant to report at this time.

## 2023-11-18 NOTE — Patient Instructions (Signed)
 Monica Hunter , Thank you for taking time out of your busy schedule to complete your Annual Wellness Visit with me. I enjoyed our conversation and look forward to speaking with you again next year. I, as well as your care team,  appreciate your ongoing commitment to your health goals. Please review the following plan we discussed and let me know if I can assist you in the future. Your Game plan/ To Do List    Referrals: If you haven't heard from the office you've been referred to, please reach out to them at the phone provided.  N/a Follow up Visits: Next Medicare AWV with our clinical staff: 11/23/2024 at 3:30   Have you seen your provider in the last 6 months (3 months if uncontrolled diabetes)? Yes Next Office Visit with your provider: will schedule closer to that time  Clinician Recommendations:  Aim for 30 minutes of exercise or brisk walking, 6-8 glasses of water, and 5 servings of fruits and vegetables each day.       This is a list of the screening recommended for you and due dates:  Health Maintenance  Topic Date Due   DTaP/Tdap/Td vaccine (1 - Tdap) Never done   Pneumococcal Vaccination (1 of 2 - PCV) Never done   COVID-19 Vaccine (3 - 2024-25 season) 03/02/2023   Eye exam for diabetics  03/28/2023   Flu Shot  01/30/2024   Hemoglobin A1C  05/06/2024   Yearly kidney health urinalysis for diabetes  07/16/2024   Complete foot exam   07/16/2024   Yearly kidney function blood test for diabetes  11/03/2024   Medicare Annual Wellness Visit  11/17/2024   Pap with HPV screening  03/06/2027   Hepatitis C Screening  Completed   HIV Screening  Completed   HPV Vaccine  Aged Out   Meningitis B Vaccine  Aged Out    Advanced directives: (Copy Requested) Please bring a copy of your health care power of attorney and living will to the office to be added to your chart at your convenience. You can mail to [facility] [address] [place], [place] [address] or email to  [email or web address] Advance Care Planning is important because it:  [x]  Makes sure you receive the medical care that is consistent with your values, goals, and preferences  [x]  It provides guidance to your family and loved ones and reduces their decisional burden about whether or not they are making the right decisions based on your wishes.  Follow the link provided in your after visit summary or read over the paperwork we have mailed to you to help you started getting your Advance Directives in place. If you need assistance in completing these, please reach out to us  so that we can help you!  See attachments for Preventive Care and Fall Prevention Tips.

## 2023-11-20 ENCOUNTER — Other Ambulatory Visit: Payer: Self-pay | Admitting: Internal Medicine

## 2023-11-20 ENCOUNTER — Other Ambulatory Visit: Payer: Self-pay | Admitting: Nurse Practitioner

## 2023-11-20 ENCOUNTER — Ambulatory Visit (INDEPENDENT_AMBULATORY_CARE_PROVIDER_SITE_OTHER): Payer: 59 | Admitting: Internal Medicine

## 2023-11-20 ENCOUNTER — Encounter: Payer: Self-pay | Admitting: Internal Medicine

## 2023-11-20 VITALS — BP 130/76 | HR 83 | Ht 68.0 in | Wt 369.0 lb

## 2023-11-20 DIAGNOSIS — Z794 Long term (current) use of insulin: Secondary | ICD-10-CM

## 2023-11-20 DIAGNOSIS — M150 Primary generalized (osteo)arthritis: Secondary | ICD-10-CM

## 2023-11-20 DIAGNOSIS — E119 Type 2 diabetes mellitus without complications: Secondary | ICD-10-CM

## 2023-11-20 MED ORDER — INSULIN GLARGINE 100 UNITS/ML SOLOSTAR PEN
10.0000 [IU] | PEN_INJECTOR | Freq: Every day | SUBCUTANEOUS | 3 refills | Status: DC
Start: 2023-11-20 — End: 2023-11-21

## 2023-11-20 MED ORDER — INSULIN PEN NEEDLE 32G X 4 MM MISC: 1.0000 | Freq: Every day | 3 refills | Status: AC

## 2023-11-20 MED ORDER — TIRZEPATIDE 7.5 MG/0.5ML ~~LOC~~ SOAJ: 7.5000 mg | SUBCUTANEOUS | 3 refills | Status: DC

## 2023-11-20 NOTE — Patient Instructions (Addendum)
 Decrease Lantus  10 units once daily Increase Mounjaro  7.5 mg once weekly    HOW TO TREAT LOW BLOOD SUGARS (Blood sugar LESS THAN 70 MG/DL) Please follow the RULE OF 15 for the treatment of hypoglycemia treatment (when your (blood sugars are less than 70 mg/dL)   STEP 1: Take 15 grams of carbohydrates when your blood sugar is low, which includes:  3-4 GLUCOSE TABS  OR 3-4 OZ OF JUICE OR REGULAR SODA OR ONE TUBE OF GLUCOSE GEL    STEP 2: RECHECK blood sugar in 15 MINUTES STEP 3: If your blood sugar is still low at the 15 minute recheck --> then, go back to STEP 1 and treat AGAIN with another 15 grams of carbohydrates.

## 2023-11-20 NOTE — Progress Notes (Signed)
 Name: Monica Hunter  MRN/ DOB: [id number], 15-Apr-1981   Age/ Sex: 43 y.o., female    PCP: Early, Adriane Albe, NP   Reason for Endocrinology Evaluation: Type 2 Diabetes Mellitus     Date of Initial Endocrinology Visit: 08/05/2022    PATIENT IDENTIFIER: Monica Hunter is a 43 y.o. female with a past medical history of DM and Adthma . The patient presented for initial endocrinology clinic visit on 08/05/2022 for consultative assistance with her diabetes management.    HPI: Monica Hunter was    Diagnosed with DM years  Prior Medications tried/Intolerance: Metformin -diarrhea , Ozempic  - vomiting       Hemoglobin A1c has ranged from 6.9% in 2023, peaking at 14.2% in 2023.  Has hx of DKA's in the past , last admission 09/2021   On her initial visit to our clinic she had an A1c of 7.5%, she had Trulicity  on her medication list that she has not taken for approximately 3 months prior to her presentation.  Which we restarted  We discontinued Ozempic  due to nausea and heartburn and started Mounjaro  as well as Lantus  01/2023 with an A1c of 12.1%    SUBJECTIVE:   During the last visit (07/17/2023): A1c 6.9%    Today (11/20/23): Monica Hunter is here for follow-up on diabetes management.  Patient checks glucose multiple times a day with the Dexcom, minimal hypoglycemia.   She does have occasional nausea Has occasional constipation  Weight has been stable  She is moving to [facility]    HOME DIABETES REGIMEN: Mounjaro  5 mg weekly Lantus  15 units daily   Statin: no ACE-I/ARB: no  CONTINUOUS GLUCOSE MONITORING RECORD INTERPRETATION    Dates of Recording: 5/5-5/18/2025  Sensor description:dexcom  Results statistics:   CGM use % of time 86  Average and SD 116/24  Time in range  98%  % Time Above 180 2  % Time above 250 0  % Time Below target 0   Glycemic patterns summary: BGs are optimal throughout the day and night  Hyperglycemic episodes N/A  Hypoglycemic episodes occurred  N/A  Overnight periods: Optimal    DIABETIC COMPLICATIONS: Microvascular complications:   Denies: CKD, retinopathic  Last eye exam: Completed 09/18/2023  Macrovascular complications:   Denies: CAD, PVD, CVA   PAST HISTORY: Past Medical History:  Past Medical History:  Diagnosis Date   Abscess 02/12/2023   Allergy    Anxiety    Arthritis    Asthma    Bilateral occipital neuralgia 08/13/2022   Candidal dermatitis 11/04/2021   Cornea abrasion    Depression    Diabetes mellitus    GERD (gastroesophageal reflux disease)    Migraines    Morbid obesity (HCC)    Past Surgical History:  Past Surgical History:  Procedure Laterality Date   CERVICAL POLYPECTOMY     DILATION AND CURETTAGE OF UTERUS      Social History:  reports that she has been smoking cigarettes. She has been exposed to tobacco smoke. She has never used smokeless tobacco. She reports that she does not drink alcohol and does not use drugs. Family History:  Family History  Problem Relation Age of Onset   Allergic rhinitis Mother    Diabetes Mother    Sinusitis Mother    Arthritis Mother    Sinusitis Father    Allergic rhinitis Father    Diabetes Father    Sinusitis Sister    Allergic rhinitis Sister    Infertility Sister    Sinusitis Brother  Allergic rhinitis Brother    Sinusitis Brother    Asthma Brother    Allergic rhinitis Brother    Cancer Maternal Grandmother 22       unknown type, metastatic   Diabetes Paternal Grandmother    Asthma Maternal Aunt    COPD Maternal Aunt    Breast cancer Maternal Aunt 30 - 88       unilat. mast.   Cancer Other        unknown type; one dx in 72s   Immunodeficiency Neg Hx    Eczema Neg Hx    Angioedema Neg Hx    Atopy Neg Hx    Urticaria Neg Hx      HOME MEDICATIONS: Allergies as of 11/20/2023       Reactions   Ceftriaxone Hives, Itching   Methocarbamol Itching, Rash   Sulfa Antibiotics Other (See Comments), Hives, Itching, Rash   Reaction  unknown        Medication List        Accurate as of Nov 20, 2023 10:07 AM. If you have any questions, ask your nurse or doctor.          Aerochamber Plus Device Use as directed with Metered Dose Inhaler   albuterol  (2.5 MG/3ML) 0.083% nebulizer solution Commonly known as: PROVENTIL  Take 3 mLs (2.5 mg total) by nebulization every 4 (four) hours as needed for wheezing or shortness of breath.   albuterol  108 (90 Base) MCG/ACT inhaler Commonly known as: VENTOLIN  HFA Inhale 2 puffs into the lungs every 6 (six) hours as needed for wheezing or shortness of breath.   azelastine  0.1 % nasal spray Commonly known as: ASTELIN  Place 2 sprays into both nostrils 2 (two) times daily. Use in each nostril as directed   Azelastine -Fluticasone  137-50 MCG/ACT Susp Commonly known as: Dymista  Place 1 spray into both nostrils 2 (two) times daily for congestion or nasal drainage.   bacitracin  500 UNIT/GM ointment Apply 1 Application topically 2 (two) times daily.   Breztri  Aerosphere 160-9-4.8 MCG/ACT Aero inhaler Generic drug: budesonide -glycopyrrolate-formoterol  Inhale 2 puffs into the lungs in the morning and at bedtime.   cromolyn  4 % ophthalmic solution Commonly known as: OPTICROM  Place 1 drop into both eyes 4 (four) times daily as needed (itchy/watery eyes).   Dexcom G7 Sensor Misc Change and apply one sensor to the abdomen every 10 days.   Dexcom G7 Sensor Misc Apply one sensor to the abdomen every 10 days.   diclofenac  50 MG EC tablet Commonly known as: VOLTAREN  Take 2 tablets (100 mg total) by mouth in the morning AND 1 tablet (50 mg total) at bedtime as needed for pain.   fluticasone  50 MCG/ACT nasal spray Commonly known as: FLONASE  Place 2 sprays into both nostrils daily.   hydrOXYzine  25 MG tablet Commonly known as: ATARAX  TAKE 1 TABLET BY MOUTH EVERY 8 HOURS AS NEEDED.   insulin  glargine 100 unit/mL Sopn Commonly known as: LANTUS  Inject 20 Units into the skin  daily.   Insulin  Pen Needle 32G X 4 MM Misc 1 Device daily in the afternoon.   levocetirizine 5 MG tablet Commonly known as: XYZAL  Take 1 tablet (5 mg total) by mouth 2 (two) times daily.   montelukast  10 MG tablet Commonly known as: SINGULAIR  Take 1 tablet (10 mg total) by mouth at bedtime.   norethindrone  0.35 MG tablet Commonly known as: MICRONOR  Take 1 tablet (0.35 mg total) by mouth daily.   nystatin  ointment Commonly known as: MYCOSTATIN  Apply 1 Application  topically 2 (two) times daily.   ondansetron  4 MG disintegrating tablet Commonly known as: ZOFRAN -ODT Take 1 tablet (4 mg total) by mouth every 8 (eight) hours as needed for nausea or vomiting.   oxymetazoline  0.05 % nasal spray Commonly known as: AFRIN 2 sprays each nostril twice a day for next 3 days.  Wait until you can breathe through nose then use Dymista    pantoprazole  40 MG tablet Commonly known as: PROTONIX  Take 1 tablet (40 mg total) by mouth daily.   SUMAtriptan  100 MG tablet Commonly known as: IMITREX  Take 1 tablet (100 mg total) by mouth as needed for migraine. May repeat in 2 hours if headache persists or recurs.   tirzepatide  5 MG/0.5ML Pen Commonly known as: MOUNJARO  Inject 5 mg into the skin once a week.   valACYclovir  1000 MG tablet Commonly known as: VALTREX  Take 1 tablet (1,000 mg total) by mouth daily.   Vitamin D  (Ergocalciferol ) 1.25 MG (50000 UNIT) Caps capsule Commonly known as: DRISDOL  Take 1 capsule (50,000 Units total) by mouth every 7 (seven) days.         ALLERGIES: Allergies  Allergen Reactions   Ceftriaxone Hives and Itching   Methocarbamol Itching and Rash   Sulfa Antibiotics Other (See Comments), Hives, Itching and Rash    Reaction unknown     REVIEW OF SYSTEMS: A comprehensive ROS was conducted with the patient and is negative except as per HPI     OBJECTIVE:   VITAL SIGNS: BP 130/76 (BP Location: Left Arm, Patient Position: Sitting, Cuff Size: Normal)    Pulse 83   Ht 5\' 8"  (1.727 m)   Wt (!) 369 lb (167.4 kg)   LMP  (LMP Unknown)   SpO2 98%   BMI 56.11 kg/m      Filed Weights   11/20/23 1003  Weight: (!) 369 lb (167.4 kg)   Filed Weights   11/20/23 1003  Weight: (!) 369 lb (167.4 kg)    PHYSICAL EXAM:  General: Pt appears well and is in NAD  Lungs: Clear with good BS bilat   Heart: RRR   Extremities:  Lower extremities - trace pretibial edema.  Neuro: MS is good with appropriate affect, pt is alert and Ox3    DM foot exam: 07/17/2023  The skin of the feet is intact without sores or ulcerations. The pedal pulses are 2+ on right and 2+ on left. The sensation is intact to a screening 5.07, 10 gram monofilament bilaterally    DATA REVIEWED:  Lab Results  Component Value Date   HGBA1C 6.8 (H) 11/04/2023   HGBA1C 6.9 (A) 07/17/2023   HGBA1C 9.3 (H) 05/15/2023    Latest Reference Range & Units 11/04/23 11:24  Sodium 134 - 144 mmol/L 138  Potassium 3.5 - 5.2 mmol/L 4.2  Chloride 96 - 106 mmol/L 103  CO2 20 - 29 mmol/L 20  Glucose 70 - 99 mg/dL 93  BUN 6 - 24 mg/dL 8  Creatinine 1.61 - 0.96 mg/dL 0.45 (L)  Calcium 8.7 - 10.2 mg/dL 9.0  BUN/Creatinine Ratio 9 - 23  15  eGFR >59 mL/min/1.73 119  Alkaline Phosphatase 44 - 121 IU/L 103  Albumin 3.9 - 4.9 g/dL 4.0  AST 0 - 40 IU/L 13  ALT 0 - 32 IU/L 15  Total Protein 6.0 - 8.5 g/dL 6.8  Total Bilirubin 0.0 - 1.2 mg/dL 0.2    Latest Reference Range & Units 11/04/23 11:24  Total CHOL/HDL Ratio 0.0 - 4.4 ratio 4.1  Cholesterol, Total 100 - 199 mg/dL 952  HDL Cholesterol >84 mg/dL 42  Triglycerides 0 - 132 mg/dL 440  VLDL Cholesterol Cal 5 - 40 mg/dL 26  LDL Chol Calc (NIH) 0 - 99 mg/dL 102 (H)    Old records , labs and images have been reviewed.    ASSESSMENT / PLAN / RECOMMENDATIONS:   1) Type 2 Diabetes Mellitus, Optimally controlled, Without complications - Most recent A1c of 6.8 %. Goal A1c < 7.0 %.     -A1c remains optimal -She endorsed nausea and  heartburn with Ozempic , but has been tolerating Mounjaro  well .  Patient requested switching from Mounjaro  to [facility] as she has not noticed any weight loss on Mounjaro .  I did explain to the patient that Frederik Jansky is semaglutide  and she has endorsed vomiting with Ozempic  in the past.  I also explained to the patient that the reason she is not noticing weight loss is because she is on small dose of Mounjaro , we are slowly titrating the dose up to avoid any GI side effects -Will decrease Lantus , as she does not want to do continue Lantus  at this time, patient was advised to titrate the dose down by 4 units should hypoglycemia into -Will increase Mounjaro     MEDICATIONS:  Increase Mounjaro  7.5 mg weekly Decrease Lantus  10 units daily  EDUCATION / INSTRUCTIONS: BG monitoring instructions: Patient is instructed to check her blood sugars 3 times a day. Call [place] [facility] if: BG persistently < 70  I reviewed the Rule of 15 for the treatment of hypoglycemia in detail with the patient. Literature supplied.   2) Diabetic complications:  Eye: Does not have known diabetic retinopathy.  Neuro/ Feet: Does not have known diabetic peripheral neuropathy. Renal: Patient does not have known baseline CKD. She is not on an ACEI/ARB at present.   Patient is moving to [place]    I spent 25 minutes preparing to see the patient by review of recent labs, imaging and procedures, obtaining and reviewing separately obtained history, communicating with the patient/family or caregiver, ordering medications, tests or procedures, and documenting clinical information in the EHR including the differential Dx, treatment, and any further evaluation and other management   Signed electronically by: Natale Bail, MD  [facility]  [facility] [address] [place] [place], [place] [address] Phone: [phone] FAX: [phone]   CC: Annella Kief, NP [address] [place] [address] Phone: [phone]  Fax: [phone]    Return to Endocrinology clinic as below: Future Appointments  Date Time Provider Department Center  11/20/2023 10:50 AM Joquan Lotz, Julian Obey, MD [facility] None  11/23/2024  3:30 PM [facility]-ANNUAL WELLNESS VISIT [facility]

## 2023-11-25 ENCOUNTER — Other Ambulatory Visit: Payer: Self-pay

## 2023-11-26 ENCOUNTER — Other Ambulatory Visit (HOSPITAL_COMMUNITY): Payer: Self-pay

## 2023-11-26 ENCOUNTER — Other Ambulatory Visit: Payer: Self-pay

## 2023-11-26 ENCOUNTER — Ambulatory Visit: Payer: Self-pay | Admitting: Nurse Practitioner

## 2023-11-26 MED ORDER — DEXCOM G7 SENSOR MISC: 3 refills | Status: AC

## 2023-11-26 MED ORDER — ONDANSETRON 4 MG PO TBDP
4.0000 mg | ORAL_TABLET | Freq: Three times a day (TID) | ORAL | 3 refills | Status: AC | PRN
Start: 2023-11-26 — End: ?
  Filled 2023-11-26 (×2): qty 20, 7d supply, fill #0

## 2023-11-26 MED ORDER — DEXCOM G7 SENSOR MISC
3 refills | Status: DC
Start: 2023-11-26 — End: 2023-11-26
  Filled 2023-11-26: qty 9, fill #0

## 2023-11-26 NOTE — Addendum Note (Signed)
 Addended by: Farouk Vivero, Abraham Hoffmann E on: 11/26/2023 11:40 AM   Modules accepted: Orders

## 2023-11-28 ENCOUNTER — Telehealth: Payer: Self-pay

## 2023-11-28 NOTE — Progress Notes (Signed)
   Telephone encounter was:  Successful.  Complex Care Management Note Care Guide Note  11/28/2023 Name: Monica Hunter MRN: [id number] DOB: 14-Oct-1980  Monica Hunter is a 43 y.o. year old female who is a primary care patient of Early, Adriane Albe, NP . The community resource team was consulted for assistance with Food Insecurity and Financial Difficulties related to Financial strain  SDOH screenings and interventions completed:  No        Care guide performed the following interventions: Patient provided with information about care guide support team and interviewed to confirm resource needs.Patient stated she found food resources in her area last week and has no needs at this time. I was able to give online resources to her for future needs   Follow Up Plan:  No further follow up planned at this time. The patient has been provided with needed resources.  Encounter Outcome:  Patient Visit Completed    Azell Leopard [facility]  [facility] Guide, Phone: [phone] Fax: [phone] Website: [email or web address]

## 2023-12-28 DIAGNOSIS — J020 Streptococcal pharyngitis: Secondary | ICD-10-CM | POA: Diagnosis not present

## 2023-12-28 DIAGNOSIS — Z572 Occupational exposure to dust: Secondary | ICD-10-CM | POA: Diagnosis not present

## 2023-12-28 DIAGNOSIS — F17210 Nicotine dependence, cigarettes, uncomplicated: Secondary | ICD-10-CM | POA: Diagnosis not present

## 2023-12-28 DIAGNOSIS — Z882 Allergy status to sulfonamides status: Secondary | ICD-10-CM | POA: Diagnosis not present

## 2023-12-28 DIAGNOSIS — Z794 Long term (current) use of insulin: Secondary | ICD-10-CM | POA: Diagnosis not present

## 2023-12-28 DIAGNOSIS — E119 Type 2 diabetes mellitus without complications: Secondary | ICD-10-CM | POA: Diagnosis not present

## 2023-12-28 DIAGNOSIS — K219 Gastro-esophageal reflux disease without esophagitis: Secondary | ICD-10-CM | POA: Diagnosis not present

## 2024-01-22 ENCOUNTER — Emergency Department (HOSPITAL_BASED_OUTPATIENT_CLINIC_OR_DEPARTMENT_OTHER): Admission: EM | Admit: 2024-01-22 | Discharge: 2024-01-22 | Disposition: A | Discharge status: Home / Self Care

## 2024-01-22 ENCOUNTER — Telehealth: Payer: Self-pay | Admitting: Allergy

## 2024-01-22 ENCOUNTER — Encounter (HOSPITAL_BASED_OUTPATIENT_CLINIC_OR_DEPARTMENT_OTHER): Payer: Self-pay

## 2024-01-22 ENCOUNTER — Other Ambulatory Visit: Payer: Self-pay

## 2024-01-22 DIAGNOSIS — M654 Radial styloid tenosynovitis [de Quervain]: Secondary | ICD-10-CM | POA: Insufficient documentation

## 2024-01-22 DIAGNOSIS — R7309 Other abnormal glucose: Secondary | ICD-10-CM | POA: Insufficient documentation

## 2024-01-22 DIAGNOSIS — L2481 Irritant contact dermatitis due to metals: Secondary | ICD-10-CM

## 2024-01-22 DIAGNOSIS — M778 Other enthesopathies, not elsewhere classified: Secondary | ICD-10-CM | POA: Diagnosis not present

## 2024-01-22 DIAGNOSIS — M79644 Pain in right finger(s): Secondary | ICD-10-CM | POA: Diagnosis present

## 2024-01-22 LAB — CBG MONITORING, ED: Glucose-Capillary: 194 mg/dL — ABNORMAL HIGH (ref 70–99)

## 2024-01-22 MED ORDER — KETOROLAC TROMETHAMINE 15 MG/ML IJ SOLN
15.0000 mg | Freq: Once | INTRAMUSCULAR | Status: AC
  Administered 2024-01-22: 15 mg via INTRAMUSCULAR
  Filled 2024-01-22: qty 1

## 2024-01-22 NOTE — ED Provider Notes (Signed)
 [place] EMERGENCY DEPARTMENT AT [facility] Provider Note   CSN: [id number] Arrival date & time: 01/22/24  8170     Patient presents with: Hand Pain   Monica Hunter is a 43 y.o. female.   43 year old female presents for eval of bilateral thumb pain.  She states has been going on for a while but getting worse the last couple.  States she fractured her thumbs as a child and has had issues with it since then.  States it gets worse when she tries to lift something up or bump it.  States the left hand is worse than the right.  Denies any falls or injury.  Denies any other symptoms or concerns at this time.   Hand Pain Pertinent negatives include no chest pain, no abdominal pain and no shortness of breath.       Prior to Admission medications   Medication Sig Start Date End Date Taking? Authorizing Provider  albuterol  (PROVENTIL ) (2.5 MG/3ML) 0.083% nebulizer solution Take 3 mLs (2.5 mg total) by nebulization every 4 (four) hours as needed for wheezing or shortness of breath. 08/22/23   Padgett, Danita Macintosh, MD  albuterol  (VENTOLIN  HFA) 108 (90 Base) MCG/ACT inhaler Inhale 2 puffs into the lungs every 6 (six) hours as needed for wheezing or shortness of breath. 11/14/23   Jeneal Danita Macintosh, MD  azelastine  (ASTELIN ) 0.1 % nasal spray Place 2 sprays into both nostrils 2 (two) times daily. Use in each nostril as directed 11/14/23   Jeneal Danita Macintosh, MD  bacitracin  500 UNIT/GM ointment Apply 1 Application topically 2 (two) times daily. 04/01/23   Leftwich-Kirby, Olam LABOR, CNM  Budeson-Glycopyrrol-Formoterol  (BREZTRI  AEROSPHERE) 160-9-4.8 MCG/ACT AERO Inhale 2 puffs into the lungs in the morning and at bedtime. 08/22/23   Jeneal Danita Macintosh, MD  Continuous Glucose Sensor (DEXCOM G7 SENSOR) MISC Apply one sensor to the abdomen every 10 days. 11/26/23   Early, Sara E, NP  cromolyn  (OPTICROM ) 4 % ophthalmic solution Place 1 drop into both eyes 4 (four) times daily as  needed (itchy/watery eyes). 11/14/23   Jeneal Danita Macintosh, MD  diclofenac  (VOLTAREN ) 50 MG EC tablet Take 2 tablets (100 mg total) by mouth in the morning AND 1 tablet (50 mg total) at bedtime as needed for pain. 11/04/23   Early, Sara E, NP  fluticasone  (FLONASE ) 50 MCG/ACT nasal spray Place 2 sprays into both nostrils daily. 11/14/23   Jeneal Danita Macintosh, MD  hydrOXYzine  (ATARAX ) 25 MG tablet TAKE 1 TABLET BY MOUTH EVERY 8 HOURS AS NEEDED. 11/18/22   Jeneal Danita Macintosh, MD  insulin  degludec (TRESIBA FLEXTOUCH) 100 UNIT/ML FlexTouch Pen Inject 10 Units into the skin daily. 11/21/23   Shamleffer, Donell Cardinal, MD  Insulin  Pen Needle 32G X 4 MM MISC 1 Device by Other route daily in the afternoon. 11/20/23   Shamleffer, Ibtehal Jaralla, MD  levocetirizine (XYZAL ) 5 MG tablet Take 1 tablet (5 mg total) by mouth 2 (two) times daily. 11/14/23   Jeneal Danita Macintosh, MD  montelukast  (SINGULAIR ) 10 MG tablet Take 1 tablet (10 mg total) by mouth at bedtime. 11/14/23   Jeneal Danita Macintosh, MD  norethindrone  (MICRONOR ) 0.35 MG tablet Take 1 tablet (0.35 mg total) by mouth daily. 09/05/22   Cleotilde Ronal RAMAN, MD  nystatin  ointment (MYCOSTATIN ) Apply 1 Application topically 2 (two) times daily. 04/01/23   Leftwich-Kirby, Olam LABOR, CNM  ondansetron  (ZOFRAN -ODT) 4 MG disintegrating tablet Take 1 tablet (4 mg total) by mouth every 8 (eight) hours as needed for  nausea or vomiting. 11/26/23   Early, Sara E, NP  oxymetazoline  (AFRIN) 0.05 % nasal spray 2 sprays each nostril twice a day for next 3 days.  Wait until you can breathe through nose then use Dymista  08/22/23   Jeneal Danita Macintosh, MD  pantoprazole  (PROTONIX ) 40 MG tablet Take 1 tablet (40 mg total) by mouth daily. 11/04/23   Early, Sara E, NP  Spacer/Aero-Holding Chambers (AEROCHAMBER PLUS WITH MASK) inhaler Use as directed with Metered Dose Inhaler 07/24/22   Jeneal Danita Macintosh, MD  SUMAtriptan  (IMITREX ) 100 MG tablet Take 1 tablet  (100 mg total) by mouth as needed for migraine. May repeat in 2 hours if headache persists or recurs. 10/13/23   Early, Sara E, NP  tirzepatide  (MOUNJARO ) 7.5 MG/0.5ML Pen Inject 7.5 mg into the skin once a week. 11/20/23   Shamleffer, Ibtehal Jaralla, MD  valACYclovir  (VALTREX ) 1000 MG tablet Take 1 tablet (1,000 mg total) by mouth daily. 05/13/22   Early, Sara E, NP  Vitamin D , Ergocalciferol , (DRISDOL ) 1.25 MG (50000 UNIT) CAPS capsule Take 1 capsule (50,000 Units total) by mouth every 7 (seven) days. 11/06/23   Early, Sara E, NP    Allergies: Ceftriaxone, Methocarbamol, and Sulfa antibiotics    Review of Systems  Constitutional:  Negative for chills and fever.  HENT:  Negative for ear pain and sore throat.   Eyes:  Negative for pain and visual disturbance.  Respiratory:  Negative for cough and shortness of breath.   Cardiovascular:  Negative for chest pain and palpitations.  Gastrointestinal:  Negative for abdominal pain and vomiting.  Genitourinary:  Negative for dysuria and hematuria.  Musculoskeletal:  Negative for arthralgias and back pain.  Skin:  Negative for color change and rash.  Neurological:  Negative for seizures and syncope.  All other systems reviewed and are negative.   Updated Vital Signs BP (!) 178/108 (BP Location: Right Arm)   Pulse (!) 107   Temp 98 F (36.7 C) (Tympanic)   Resp 18   Ht 5' 8 (1.727 m)   Wt (!) 167.8 kg   SpO2 100%   BMI 56.26 kg/m   Physical Exam Vitals and nursing note reviewed.  Constitutional:      General: She is not in acute distress.    Appearance: Normal appearance. She is well-developed. She is obese. She is not ill-appearing.  HENT:     Head: Normocephalic and atraumatic.  Eyes:     Conjunctiva/sclera: Conjunctivae normal.  Cardiovascular:     Rate and Rhythm: Normal rate and regular rhythm.     Heart sounds: No murmur heard. Pulmonary:     Effort: Pulmonary effort is normal. No respiratory distress.     Breath sounds:  Normal breath sounds.  Abdominal:     Palpations: Abdomen is soft.     Tenderness: There is no abdominal tenderness.  Musculoskeletal:        General: No swelling.     Cervical back: Neck supple.     Comments: Patient with no swelling or deformity to thumbs and she does have full range of motion but some pain with range of motion of the left.  She does have tenderness over the anatomic snuffbox bilaterally and some positive Tinel sign on the left.  Skin:    General: Skin is warm and dry.     Capillary Refill: Capillary refill takes less than 2 seconds.  Neurological:     Mental Status: She is alert.  Psychiatric:  Mood and Affect: Mood normal.     (all labs ordered are listed, but only abnormal results are displayed) Labs Reviewed  CBG MONITORING, ED - Abnormal; Notable for the following components:      Result Value   Glucose-Capillary 194 (*)    All other components within normal limits    EKG: None  Radiology: No results found.   Procedures   Medications Ordered in the ED  ketorolac  (TORADOL ) 15 MG/ML injection 15 mg (has no administration in time range)                                    Medical Decision Making Patient with tendinitis of her bilateral thumbs worse on the left.  Will give her bilateral thumb spica splints and a dose of Toradol  here.  Advised to continue her diclofenac  and add in Tylenol  as needed for pain.  Advise follow-up with primary care and orthopedic surgery as needed.  She feels comfortable to plan to be discharged home.  Problems Addressed: Tendinitis, de Quervain's: acute illness or injury  Amount and/or Complexity of Data Reviewed External Data Reviewed: notes.    Details: Prior ED records reviewed and patient was seen in June 2025 for strep throat  Risk OTC drugs. Prescription drug management.     Final diagnoses:  Tendinitis, de Quervain's    ED Discharge Orders     None          Gennaro Duwaine CROME,  DO 01/22/24 2040

## 2024-01-22 NOTE — Telephone Encounter (Signed)
 Andree called and stated that she is in need of her medications to be sent to a different pharmacy. She now needs them to be at the [facility] on [address]. She states she needs the following scripts sent there. fluticasone  (FLONASE ) 50 MCG/AC , hydrOXYzine  (ATARAX ) 25 MG tablet, azelastine  (ASTELIN ) 0.1 % nasal spray and  albuterol  (VENTOLIN  HFA) 108 (90 Base) MCG/ACT inhaler Best number to contact [phone].

## 2024-01-22 NOTE — Discharge Instructions (Addendum)
 Continue your diclofenac  and also use Tylenol  as needed for pain.  Wear your wrist splints as much as you can take at night.  Follow-up with your primary care doctor.  If your symptoms do not improve follow-up with orthopedic surgery.

## 2024-01-22 NOTE — ED Triage Notes (Signed)
 Pt reports bilateral thumb pain x3 days. Pt denies any injury or trauma. Pt reports she broke her thumbs when she was 5 and wants an xray.

## 2024-01-23 DIAGNOSIS — M654 Radial styloid tenosynovitis [de Quervain]: Secondary | ICD-10-CM | POA: Diagnosis not present

## 2024-01-23 MED ORDER — AZELASTINE HCL 0.1 % NA SOLN: 2.0000 | Freq: Two times a day (BID) | NASAL | 2 refills | Status: AC

## 2024-01-23 MED ORDER — HYDROXYZINE HCL 25 MG PO TABS: 25.0000 mg | ORAL_TABLET | Freq: Three times a day (TID) | ORAL | 2 refills | Status: AC | PRN

## 2024-01-23 MED ORDER — ALBUTEROL SULFATE HFA 108 (90 BASE) MCG/ACT IN AERS: 2.0000 | INHALATION_SPRAY | Freq: Four times a day (QID) | RESPIRATORY_TRACT | 1 refills | Status: AC | PRN

## 2024-01-23 MED ORDER — FLUTICASONE PROPIONATE 50 MCG/ACT NA SUSP: 2.0000 | Freq: Every day | NASAL | 4 refills | Status: AC

## 2024-01-23 NOTE — Addendum Note (Signed)
 Addended by: NANCEE JON SAILOR on: 01/23/2024 11:51 AM   Modules accepted: Orders

## 2024-01-23 NOTE — Telephone Encounter (Signed)
 Medications have been sent to [facility] [address].

## 2024-01-28 ENCOUNTER — Telehealth: Payer: Self-pay

## 2024-01-28 MED ORDER — LEVOCETIRIZINE DIHYDROCHLORIDE 5 MG PO TABS: 5.0000 mg | ORAL_TABLET | Freq: Every day | ORAL | 5 refills | Status: AC

## 2024-01-28 NOTE — Telephone Encounter (Signed)
 Pt. Is seeing Dr. Vita tomorrow.   Copied from CRM [id number]. Topic: Clinical - Medical Advice >> Jan 28, 2024 11:35 AM Graeme ORN wrote: Reason for CRM: Patient called. States she was seen at ED. They think should have surgery on hand. She would like to discuss alternatives and pain relief options with PCP. No appts until October. Scheduled hospital follow up for tomorrow with Dr Vita. Patient would like to know if pcp will send something in to help with her inflamation. Thank You    ----------------------------------------------------------------------- From previous Reason for Contact - Scheduling: Patient/patient representative is calling to schedule an appointment. Refer to attachments for appointment information.

## 2024-01-28 NOTE — Addendum Note (Signed)
 Addended by: ONEITA CHRISTIANS D on: 01/28/2024 10:16 AM   Modules accepted: Orders

## 2024-01-29 ENCOUNTER — Inpatient Hospital Stay: Admitting: Family Medicine

## 2024-01-30 ENCOUNTER — Encounter: Payer: Self-pay | Admitting: Family Medicine

## 2024-01-30 ENCOUNTER — Ambulatory Visit (INDEPENDENT_AMBULATORY_CARE_PROVIDER_SITE_OTHER): Admitting: Family Medicine

## 2024-01-30 VITALS — BP 148/88 | HR 92 | Ht 69.0 in | Wt 362.4 lb

## 2024-01-30 DIAGNOSIS — M180 Bilateral primary osteoarthritis of first carpometacarpal joints: Secondary | ICD-10-CM | POA: Diagnosis not present

## 2024-01-30 MED ORDER — METHYLPREDNISOLONE 4 MG PO TBPK: ORAL_TABLET | ORAL | 0 refills | Status: AC

## 2024-01-30 NOTE — Progress Notes (Signed)
   Name: Monica Hunter   Date of Visit: 01/30/24   Date of last visit with me: Visit date not found   CHIEF COMPLAINT:  Chief Complaint  Patient presents with   Hospitalization Follow-up    Pain relief discuss alt to surgery.both hands have arthritis nerves and tendons swollen, old fracture in left hand, stomach cramps as well       HPI:  Discussed the use of AI scribe software for clinical note transcription with the patient, who gave verbal consent to proceed.  History of Present Illness   Monica Hunter is a 43 year old female who presents with bilateral wrist pain, worse on the left.  She has been experiencing bilateral wrist pain for the past two weeks, with the left wrist being more affected than the right. The pain is persistent, exacerbated by movement, and primarily located in the thumb area, radiating upwards. She has a history of a wrist fracture at the age of 64, treated with chicken wires and a splint, which was more towards the thumb area.  She mentions experiencing numbness and tingling in her arm following a recent hospital visit where an ultrasound-guided needle insertion was performed for fluid administration. The numbness and tingling have persisted since the procedure.  She has been taking medication for arthritis pain but notes that it does not alleviate her current wrist pain. She reports difficulty sleeping due to the pain, as she tends to toss and turn at night.         OBJECTIVE:       11/18/2023    4:02 PM  Depression screen PHQ 2/9  Decreased Interest 0  Down, Depressed, Hopeless 1  PHQ - 2 Score 1     BP Readings from Last 3 Encounters:  01/30/24 (!) 148/88  01/22/24 (!) 178/108  11/20/23 130/76    BP (!) 148/88   Pulse 92   Ht 5' 9 (1.753 m)   Wt (!) 362 lb 6.4 oz (164.4 kg)   SpO2 97%   BMI 53.52 kg/m    Physical Exam   MUSCULOSKELETAL: Inspection reveals mild swelling of the CMC on right compared to left.    Tenderness over the [facility] joint and first dorsal compartment. Limited range of motion on the left hand with ulnar and radial deviation. Positive Finkelstein's test.      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 Positive Finkelstein's test. Likely CMC arthritis with possible mild de Quervain's tenosynovitis. Genetic predisposition, previous wrist fracture, and diabetes may contribute. Oral steroids prioritized despite potential blood sugar spike due to functional impairment. - Prescribed Medrol  Dosepak to reduce inflammation. - Ordered bilateral hand x-rays at [facility]. - Advised wearing wrist braces at night and during the day on the left side for the next few days. - Plan for steroid injection in two to three weeks if no improvement with oral steroids.         Junior Kenedy A. Vita MD [facility]

## 2024-02-02 ENCOUNTER — Ambulatory Visit
Admission: RE | Admit: 2024-02-02 | Discharge: 2024-02-02 | Disposition: A | Discharge status: Home / Self Care | Source: Ambulatory Visit | Attending: Family Medicine | Admitting: Family Medicine

## 2024-02-02 DIAGNOSIS — M7989 Other specified soft tissue disorders: Secondary | ICD-10-CM | POA: Diagnosis not present

## 2024-02-02 DIAGNOSIS — M180 Bilateral primary osteoarthritis of first carpometacarpal joints: Secondary | ICD-10-CM

## 2024-02-09 ENCOUNTER — Ambulatory Visit: Payer: Self-pay | Admitting: Family Medicine

## 2024-02-13 ENCOUNTER — Ambulatory Visit (INDEPENDENT_AMBULATORY_CARE_PROVIDER_SITE_OTHER): Admitting: Family Medicine

## 2024-02-13 ENCOUNTER — Encounter: Payer: Self-pay | Admitting: Family Medicine

## 2024-02-13 VITALS — BP 132/70 | HR 95 | Ht 68.0 in | Wt 360.4 lb

## 2024-02-13 DIAGNOSIS — E119 Type 2 diabetes mellitus without complications: Secondary | ICD-10-CM

## 2024-02-13 DIAGNOSIS — Z794 Long term (current) use of insulin: Secondary | ICD-10-CM | POA: Diagnosis not present

## 2024-02-13 DIAGNOSIS — M792 Neuralgia and neuritis, unspecified: Secondary | ICD-10-CM | POA: Diagnosis not present

## 2024-02-13 MED ORDER — GABAPENTIN 300 MG PO CAPS: 300.0000 mg | ORAL_CAPSULE | Freq: Three times a day (TID) | ORAL | 3 refills | Status: AC

## 2024-02-13 MED ORDER — TIRZEPATIDE 7.5 MG/0.5ML ~~LOC~~ SOAJ: 7.5000 mg | SUBCUTANEOUS | 3 refills | Status: AC

## 2024-02-13 NOTE — Progress Notes (Signed)
 Name: Monica Hunter   Date of Visit: 02/13/24   Date of last visit with me: 01/30/2024   CHIEF COMPLAINT:  Chief Complaint  Patient presents with   other    2 week f/u pain in wrist. Needs note for apt she lives in saying her sig other has to be there to help-       HPI:  Discussed the use of AI scribe software for clinical note transcription with the patient, who gave verbal consent to proceed.  History of Present Illness   Monica Hunter is a 43 year old female who presents with persistent hand pain and numbness.  She experiences ongoing pain in her left hand, described as a burning sensation primarily located in the wrist, occasionally radiating up the arm, causing numbness and difficulty lifting the arm. Despite a previous steroid injection that alleviated the major aching pain, the burning sensation persists.  The pain began after an ultrasound procedure where an IV was placed. The pain has not significantly improved since then, and numbness in the affected area continues.  She has a cyst on her pinky, which is currently asymptomatic. Her primary concern is the hand pain. She has been informed of fluid presence in the wrist and potential arthritis in the carpometacarpal joint.  She is using a wrist brace to manage the pain. She expresses concerns about the side effects of gabapentin, particularly the potential for a 'drunk' feeling and drowsiness.  She is managing her medication for [facility], currently on a 7.5 mg dose, and has encountered issues with prescription refills and pharmacy locations, ensuring her prescriptions are correctly filled at her preferred pharmacy.  In her social history, she recently moved into a new apartment and requires assistance with unpacking and settling in, needing a note for extended stay of her helper.         OBJECTIVE:       11/18/2023    4:02 PM  Depression screen PHQ 2/9  Decreased Interest 0  Down, Depressed,  Hopeless 1  PHQ - 2 Score 1     BP Readings from Last 3 Encounters:  02/13/24 132/70  01/30/24 (!) 148/88  01/22/24 (!) 178/108    BP 132/70   Pulse 95   Ht 5' 8 (1.727 m)   Wt (!) 360 lb 6.4 oz (163.5 kg)   SpO2 95%   BMI 54.80 kg/m    Physical Exam          Physical Exam   Inspection reveals no gross abnormalities of the left wrist.  No tenderness to palpation over the [facility] or the [facility] at this time.  Range of motion is full.  Patient notes subjective pins-and-needles sensation over the dorsal side of the left forearm at the distal aspect. ASSESSMENT/PLAN:   Assessment & Plan Neuropathic pain of forearm, left  Type 2 diabetes mellitus without complication, with long-term current use of insulin (HCC)    Assessment and Plan    Left forearm neuropathic pain Persistent burning sensation likely due to nerve injury from IV placement. Pain described as nerve-like and difficult to treat. - Prescribed gabapentin 300 mg, start with 300 mg twice daily, increase to three times daily if tolerated. - Discussed potential side effects of gabapentin, including drowsiness and feeling drunk. - Advised discontinuation of gabapentin if intolerable side effects occur, consider alternatives. - Allowed concurrent use of Voltaren.  Left wrist osteoarthritis Improvement in joint pain with previous steroid injection. Persistent fluid due to overuse. - Advised  continued use of wrist brace. - Consider future steroid injections if pain persists.  Ganglion cyst on left pinky Asymptomatic ganglion cyst, benign, surgical removal optional if bothersome. Consider surgical removal if bothersome.  Prescription management Issues with pharmacy transfer of Mounjaro 7.5 mg prescription, requires update for continuity of care. - Sent updated prescription for Mounjaro 7.5 mg to [facility] on [address]. - Ensured gabapentin prescription sent to [facility] on [address].  Assistance with moving and  unpacking Requires assistance due to recent relocation. - Provided note for helper to stay 14 consecutive days to assist with moving and unpacking.         Linday Rhodes A. Vita MD [facility]

## 2024-03-11 DIAGNOSIS — Z794 Long term (current) use of insulin: Secondary | ICD-10-CM | POA: Diagnosis not present

## 2024-03-11 DIAGNOSIS — E119 Type 2 diabetes mellitus without complications: Secondary | ICD-10-CM | POA: Diagnosis not present

## 2024-03-11 DIAGNOSIS — Z87891 Personal history of nicotine dependence: Secondary | ICD-10-CM | POA: Diagnosis not present

## 2024-03-22 DIAGNOSIS — G894 Chronic pain syndrome: Secondary | ICD-10-CM | POA: Diagnosis not present

## 2024-03-22 DIAGNOSIS — T7840XS Allergy, unspecified, sequela: Secondary | ICD-10-CM | POA: Diagnosis not present

## 2024-03-22 DIAGNOSIS — Z794 Long term (current) use of insulin: Secondary | ICD-10-CM | POA: Diagnosis not present

## 2024-03-22 DIAGNOSIS — G43809 Other migraine, not intractable, without status migrainosus: Secondary | ICD-10-CM | POA: Diagnosis not present

## 2024-03-22 DIAGNOSIS — E1165 Type 2 diabetes mellitus with hyperglycemia: Secondary | ICD-10-CM | POA: Diagnosis not present

## 2024-03-31 DIAGNOSIS — E119 Type 2 diabetes mellitus without complications: Secondary | ICD-10-CM | POA: Diagnosis not present

## 2024-04-09 ENCOUNTER — Other Ambulatory Visit: Payer: Self-pay | Admitting: Medical Genetics

## 2024-04-09 DIAGNOSIS — Z006 Encounter for examination for normal comparison and control in clinical research program: Secondary | ICD-10-CM

## 2024-04-11 DIAGNOSIS — M79605 Pain in left leg: Secondary | ICD-10-CM | POA: Diagnosis not present

## 2024-04-11 DIAGNOSIS — M545 Low back pain, unspecified: Secondary | ICD-10-CM | POA: Diagnosis not present

## 2024-04-11 DIAGNOSIS — F17210 Nicotine dependence, cigarettes, uncomplicated: Secondary | ICD-10-CM | POA: Diagnosis not present

## 2024-04-11 DIAGNOSIS — M1990 Unspecified osteoarthritis, unspecified site: Secondary | ICD-10-CM | POA: Diagnosis not present

## 2024-04-11 DIAGNOSIS — Z794 Long term (current) use of insulin: Secondary | ICD-10-CM | POA: Diagnosis not present

## 2024-04-11 DIAGNOSIS — K219 Gastro-esophageal reflux disease without esophagitis: Secondary | ICD-10-CM | POA: Diagnosis not present

## 2024-04-11 DIAGNOSIS — E119 Type 2 diabetes mellitus without complications: Secondary | ICD-10-CM | POA: Diagnosis not present

## 2024-04-11 DIAGNOSIS — Z7985 Long-term (current) use of injectable non-insulin antidiabetic drugs: Secondary | ICD-10-CM | POA: Diagnosis not present

## 2024-04-11 DIAGNOSIS — G8929 Other chronic pain: Secondary | ICD-10-CM | POA: Diagnosis not present

## 2024-04-11 DIAGNOSIS — R262 Difficulty in walking, not elsewhere classified: Secondary | ICD-10-CM | POA: Diagnosis not present

## 2024-06-03 ENCOUNTER — Telehealth: Payer: Self-pay

## 2024-06-03 NOTE — Telephone Encounter (Signed)
 Patient was identified as falling into the [place] Measure - Diabetes.   Patient was: Left voicemail to schedule with primary care provider. Can also have a lab appt or nurse visit for A1C.Need by year end.

## 2024-06-03 NOTE — Telephone Encounter (Signed)
 Copied from CRM #[id number]. Topic: Clinical - Lab/Test Results >> Jun 03, 2024 10:58 AM Gustabo D wrote: Pt would like a call back from a nurse about what she needs to do. She says she already had labs done and that she has a new pcp outside of [facility].

## 2024-06-11 ENCOUNTER — Telehealth: Payer: Self-pay

## 2024-06-11 NOTE — Telephone Encounter (Signed)
 Patient was identified as falling into the [place] Measure - Diabetes.   Patient was: Appointment already scheduled for:  06/04/2024.

## 2024-06-14 ENCOUNTER — Telehealth: Payer: Self-pay

## 2024-06-14 NOTE — Telephone Encounter (Signed)
 Patient was identified as falling into the [place] Measure - Diabetes.   Patient was: Appointment already scheduled for:  06/04/2024 at outside facility. A1C drawn then. Abstracted in chart.

## 2024-07-09 ENCOUNTER — Telehealth: Payer: Self-pay

## 2024-07-09 NOTE — Transitions of Care (Post Inpatient/ED Visit) (Signed)
" ° °  07/09/2024  Name: Monica Hunter MRN: [id number] DOB: 07/12/1980  Today's TOC FU Call Status: Today's TOC FU Call Status:: Unsuccessful Call (1st Attempt) Unsuccessful Call (1st Attempt) Date: 07/09/24  Attempted to reach the patient regarding the most recent Inpatient/ED visit.  Follow Up Plan: Additional outreach attempts will be made to reach the patient to complete the Transitions of Care (Post Inpatient/ED visit) call.   Shona Prow RN, CCM [place]  VBCI-Population Health RN Care Manager [phone]  "

## 2024-07-09 NOTE — Transitions of Care (Post Inpatient/ED Visit) (Signed)
" ° °  07/09/2024  Name: Monica Hunter MRN: [id number] DOB: 1980/09/13  Today's TOC FU Call Status: Today's TOC FU Call Status:: Unsuccessful Call (2nd Attempt) Unsuccessful Call (2nd Attempt) Date: 07/09/24  Attempted to reach the patient regarding the most recent Inpatient/ED visit.  Follow Up Plan: Additional outreach attempts will be made to reach the patient to complete the Transitions of Care (Post Inpatient/ED visit) call.   Shona Prow RN, CCM [place]  VBCI-Population Health RN Care Manager [phone]  "

## 2024-07-12 ENCOUNTER — Telehealth: Payer: Self-pay

## 2024-07-12 NOTE — Transitions of Care (Post Inpatient/ED Visit) (Signed)
" ° °  07/12/2024  Name: Monica Hunter MRN: [id number] DOB: 01-31-1981  Today's TOC FU Call Status: Today's TOC FU Call Status:: Unsuccessful Call (3rd Attempt) Unsuccessful Call (3rd Attempt) Date: 07/12/24  Attempted to reach the patient regarding the most recent Inpatient/ED visit.  Follow Up Plan: No further outreach attempts will be made at this time. We have been unable to contact the patient.  Shona Prow RN, CCM [place]  VBCI-Population Health RN Care Manager [phone]  "

## 2024-11-23 ENCOUNTER — Ambulatory Visit: Payer: Self-pay
